# Patient Record
Sex: Male | Born: 1940 | Race: Black or African American | Hispanic: No | Marital: Single | Smoking: Former smoker
Health system: Southern US, Community
[De-identification: ages and names within clinical notes are randomized; demographics above are authoritative.]

## PROBLEM LIST (undated history)

## (undated) DIAGNOSIS — R531 Weakness: Secondary | ICD-10-CM

## (undated) DIAGNOSIS — K219 Gastro-esophageal reflux disease without esophagitis: Secondary | ICD-10-CM

## (undated) DIAGNOSIS — G959 Disease of spinal cord, unspecified: Secondary | ICD-10-CM

## (undated) DIAGNOSIS — D509 Iron deficiency anemia, unspecified: Secondary | ICD-10-CM

## (undated) DIAGNOSIS — I10 Essential (primary) hypertension: Secondary | ICD-10-CM

## (undated) DIAGNOSIS — Z8719 Personal history of other diseases of the digestive system: Secondary | ICD-10-CM

## (undated) DIAGNOSIS — Z8612 Personal history of poliomyelitis: Secondary | ICD-10-CM

## (undated) DIAGNOSIS — S82001A Unspecified fracture of right patella, initial encounter for closed fracture: Secondary | ICD-10-CM

## (undated) DIAGNOSIS — Z87440 Personal history of urinary (tract) infections: Secondary | ICD-10-CM

## (undated) DIAGNOSIS — E785 Hyperlipidemia, unspecified: Secondary | ICD-10-CM

## (undated) HISTORY — PX: OTHER SURGICAL HISTORY: SHX169

---

## 1982-06-06 HISTORY — PX: OTHER SURGICAL HISTORY: SHX169

## 1997-10-20 ENCOUNTER — Emergency Department (HOSPITAL_COMMUNITY): Admission: EM | Admit: 1997-10-20 | Discharge: 1997-10-20 | Payer: Self-pay | Admitting: Emergency Medicine

## 1997-12-23 ENCOUNTER — Other Ambulatory Visit: Admission: RE | Admit: 1997-12-23 | Discharge: 1997-12-23 | Payer: Self-pay | Admitting: Family Medicine

## 2003-03-07 ENCOUNTER — Encounter: Payer: Self-pay | Admitting: Emergency Medicine

## 2003-03-07 ENCOUNTER — Emergency Department (HOSPITAL_COMMUNITY): Admission: EM | Admit: 2003-03-07 | Discharge: 2003-03-07 | Payer: Self-pay | Admitting: Emergency Medicine

## 2003-03-13 ENCOUNTER — Ambulatory Visit (HOSPITAL_COMMUNITY): Admission: RE | Admit: 2003-03-13 | Discharge: 2003-03-13 | Payer: Self-pay | Admitting: Emergency Medicine

## 2003-03-13 ENCOUNTER — Encounter: Payer: Self-pay | Admitting: Emergency Medicine

## 2003-04-16 ENCOUNTER — Inpatient Hospital Stay (HOSPITAL_COMMUNITY): Admission: RE | Admit: 2003-04-16 | Discharge: 2003-04-21 | Payer: Self-pay | Admitting: Neurological Surgery

## 2003-04-16 HISTORY — PX: CERVICAL FUSION: SHX112

## 2003-04-21 ENCOUNTER — Inpatient Hospital Stay
Admission: RE | Admit: 2003-04-21 | Discharge: 2003-05-08 | Payer: Self-pay | Admitting: Physical Medicine & Rehabilitation

## 2003-04-22 ENCOUNTER — Ambulatory Visit (HOSPITAL_COMMUNITY)
Admission: RE | Admit: 2003-04-22 | Discharge: 2003-04-22 | Payer: Self-pay | Admitting: Physical Medicine & Rehabilitation

## 2003-06-10 ENCOUNTER — Encounter: Admission: RE | Admit: 2003-06-10 | Discharge: 2003-06-10 | Payer: Self-pay | Admitting: Neurological Surgery

## 2003-07-22 ENCOUNTER — Emergency Department (HOSPITAL_COMMUNITY): Admission: EM | Admit: 2003-07-22 | Discharge: 2003-07-22 | Payer: Self-pay | Admitting: Emergency Medicine

## 2003-11-26 ENCOUNTER — Emergency Department (HOSPITAL_COMMUNITY): Admission: EM | Admit: 2003-11-26 | Discharge: 2003-11-26 | Payer: Self-pay | Admitting: Emergency Medicine

## 2003-12-22 ENCOUNTER — Encounter: Admission: RE | Admit: 2003-12-22 | Discharge: 2003-12-22 | Payer: Self-pay | Admitting: Neurological Surgery

## 2004-02-12 ENCOUNTER — Ambulatory Visit: Payer: Self-pay | Admitting: *Deleted

## 2004-02-12 ENCOUNTER — Ambulatory Visit: Payer: Self-pay | Admitting: Family Medicine

## 2004-02-23 ENCOUNTER — Emergency Department (HOSPITAL_COMMUNITY): Admission: EM | Admit: 2004-02-23 | Discharge: 2004-02-23 | Payer: Self-pay | Admitting: Emergency Medicine

## 2004-03-29 ENCOUNTER — Ambulatory Visit: Payer: Self-pay | Admitting: Family Medicine

## 2004-05-13 ENCOUNTER — Ambulatory Visit: Payer: Self-pay | Admitting: Family Medicine

## 2004-06-09 ENCOUNTER — Ambulatory Visit: Payer: Self-pay | Admitting: Family Medicine

## 2004-08-11 ENCOUNTER — Ambulatory Visit: Payer: Self-pay | Admitting: Family Medicine

## 2004-12-14 ENCOUNTER — Ambulatory Visit: Payer: Self-pay | Admitting: Family Medicine

## 2005-03-29 ENCOUNTER — Emergency Department (HOSPITAL_COMMUNITY): Admission: EM | Admit: 2005-03-29 | Discharge: 2005-03-29 | Payer: Self-pay | Admitting: Emergency Medicine

## 2005-04-05 ENCOUNTER — Ambulatory Visit: Payer: Self-pay | Admitting: Family Medicine

## 2005-05-19 ENCOUNTER — Ambulatory Visit: Payer: Self-pay | Admitting: Family Medicine

## 2005-10-03 ENCOUNTER — Ambulatory Visit: Payer: Self-pay | Admitting: Family Medicine

## 2006-01-30 ENCOUNTER — Ambulatory Visit: Payer: Self-pay | Admitting: Family Medicine

## 2006-04-25 ENCOUNTER — Ambulatory Visit: Payer: Self-pay | Admitting: Family Medicine

## 2006-08-07 ENCOUNTER — Ambulatory Visit: Payer: Self-pay | Admitting: Family Medicine

## 2007-01-23 ENCOUNTER — Ambulatory Visit: Payer: Self-pay | Admitting: Family Medicine

## 2007-03-22 ENCOUNTER — Emergency Department (HOSPITAL_COMMUNITY): Admission: EM | Admit: 2007-03-22 | Discharge: 2007-03-23 | Payer: Self-pay | Admitting: Emergency Medicine

## 2007-03-26 ENCOUNTER — Emergency Department (HOSPITAL_COMMUNITY): Admission: EM | Admit: 2007-03-26 | Discharge: 2007-03-26 | Payer: Self-pay | Admitting: *Deleted

## 2007-04-25 ENCOUNTER — Ambulatory Visit: Payer: Self-pay | Admitting: Family Medicine

## 2007-05-31 ENCOUNTER — Emergency Department (HOSPITAL_COMMUNITY): Admission: EM | Admit: 2007-05-31 | Discharge: 2007-05-31 | Payer: Self-pay | Admitting: Emergency Medicine

## 2007-07-13 ENCOUNTER — Ambulatory Visit: Payer: Self-pay | Admitting: Family Medicine

## 2007-10-17 ENCOUNTER — Inpatient Hospital Stay (HOSPITAL_COMMUNITY): Admission: EM | Admit: 2007-10-17 | Discharge: 2007-10-23 | Payer: Self-pay | Admitting: Emergency Medicine

## 2007-10-22 ENCOUNTER — Ambulatory Visit: Payer: Self-pay | Admitting: Surgery

## 2007-10-22 ENCOUNTER — Encounter (INDEPENDENT_AMBULATORY_CARE_PROVIDER_SITE_OTHER): Payer: Self-pay | Admitting: Internal Medicine

## 2008-02-19 ENCOUNTER — Ambulatory Visit: Payer: Self-pay | Admitting: Family Medicine

## 2008-02-19 LAB — CONVERTED CEMR LAB
ALT: 8 units/L (ref 0–53)
Albumin: 3.6 g/dL (ref 3.5–5.2)
CO2: 24 meq/L (ref 19–32)
Chloride: 99 meq/L (ref 96–112)
Cholesterol: 228 mg/dL — ABNORMAL HIGH (ref 0–200)
Glucose, Bld: 100 mg/dL — ABNORMAL HIGH (ref 70–99)
LDL Cholesterol: 179 mg/dL — ABNORMAL HIGH (ref 0–99)
Microalb, Ur: 1.01 mg/dL (ref 0.00–1.89)
PSA: 1.02 ng/mL (ref 0.10–4.00)
Potassium: 4.7 meq/L (ref 3.5–5.3)
Sodium: 134 meq/L — ABNORMAL LOW (ref 135–145)
Total Bilirubin: 0.4 mg/dL (ref 0.3–1.2)
Total Protein: 6.9 g/dL (ref 6.0–8.3)
Triglycerides: 83 mg/dL (ref <150)
VLDL: 17 mg/dL (ref 0–40)

## 2009-09-20 ENCOUNTER — Emergency Department (HOSPITAL_COMMUNITY): Admission: EM | Admit: 2009-09-20 | Discharge: 2009-09-20 | Payer: Self-pay | Admitting: Emergency Medicine

## 2010-01-06 ENCOUNTER — Inpatient Hospital Stay (HOSPITAL_COMMUNITY): Admission: EM | Admit: 2010-01-06 | Discharge: 2010-01-13 | Payer: Self-pay | Admitting: Emergency Medicine

## 2010-01-12 ENCOUNTER — Ambulatory Visit: Payer: Self-pay | Admitting: Physical Medicine & Rehabilitation

## 2010-02-21 ENCOUNTER — Emergency Department (HOSPITAL_COMMUNITY): Admission: EM | Admit: 2010-02-21 | Discharge: 2010-02-22 | Payer: Self-pay | Admitting: Emergency Medicine

## 2010-08-19 LAB — URINE CULTURE: Colony Count: >=100000

## 2010-08-19 LAB — URINALYSIS, ROUTINE W REFLEX MICROSCOPIC
Nitrite: NEGATIVE
Protein, ur: 30 mg/dL — AB
Urobilinogen, UA: 2 mg/dL — ABNORMAL HIGH (ref 0.0–1.0)

## 2010-08-19 LAB — CBC
MCH: 26.7 pg (ref 26.0–34.0)
MCV: 80.5 fL (ref 78.0–100.0)
Platelets: 324 10*3/uL (ref 150–400)
RDW: 14.8 % (ref 11.5–15.5)
WBC: 10.2 10*3/uL (ref 4.0–10.5)

## 2010-08-19 LAB — GLUCOSE, CAPILLARY
Glucose-Capillary: 298 mg/dL — ABNORMAL HIGH (ref 70–99)
Glucose-Capillary: 345 mg/dL — ABNORMAL HIGH (ref 70–99)

## 2010-08-19 LAB — DIFFERENTIAL
Basophils Absolute: 0 10*3/uL (ref 0.0–0.1)
Eosinophils Absolute: 0.2 10*3/uL (ref 0.0–0.7)
Eosinophils Relative: 2 % (ref 0–5)
Neutrophils Relative %: 71 % (ref 43–77)

## 2010-08-19 LAB — POCT I-STAT, CHEM 8
HCT: 42 % (ref 39.0–52.0)
Hemoglobin: 14.3 g/dL (ref 13.0–17.0)
Potassium: 4.5 mEq/L (ref 3.5–5.1)
Sodium: 131 mEq/L — ABNORMAL LOW (ref 135–145)
TCO2: 30 mmol/L (ref 0–100)

## 2010-08-19 LAB — URINE MICROSCOPIC-ADD ON

## 2010-08-20 LAB — GLUCOSE, CAPILLARY
Glucose-Capillary: 145 mg/dL — ABNORMAL HIGH (ref 70–99)
Glucose-Capillary: 164 mg/dL — ABNORMAL HIGH (ref 70–99)
Glucose-Capillary: 166 mg/dL — ABNORMAL HIGH (ref 70–99)
Glucose-Capillary: 172 mg/dL — ABNORMAL HIGH (ref 70–99)
Glucose-Capillary: 182 mg/dL — ABNORMAL HIGH (ref 70–99)
Glucose-Capillary: 235 mg/dL — ABNORMAL HIGH (ref 70–99)
Glucose-Capillary: 236 mg/dL — ABNORMAL HIGH (ref 70–99)
Glucose-Capillary: 239 mg/dL — ABNORMAL HIGH (ref 70–99)
Glucose-Capillary: 301 mg/dL — ABNORMAL HIGH (ref 70–99)
Glucose-Capillary: 353 mg/dL — ABNORMAL HIGH (ref 70–99)

## 2010-08-20 LAB — CBC
HCT: 33.7 % — ABNORMAL LOW (ref 39.0–52.0)
HCT: 35.1 % — ABNORMAL LOW (ref 39.0–52.0)
HCT: 36.3 % — ABNORMAL LOW (ref 39.0–52.0)
HCT: 41 % (ref 39.0–52.0)
Hemoglobin: 10.7 g/dL — ABNORMAL LOW (ref 13.0–17.0)
Hemoglobin: 11.4 g/dL — ABNORMAL LOW (ref 13.0–17.0)
Hemoglobin: 13.6 g/dL (ref 13.0–17.0)
MCH: 25.2 pg — ABNORMAL LOW (ref 26.0–34.0)
MCH: 25.9 pg — ABNORMAL LOW (ref 26.0–34.0)
MCHC: 32.2 g/dL (ref 30.0–36.0)
MCHC: 32.5 g/dL (ref 30.0–36.0)
MCV: 79.5 fL (ref 78.0–100.0)
MCV: 79.6 fL (ref 78.0–100.0)
Platelets: 334 10*3/uL (ref 150–400)
Platelets: 336 10*3/uL (ref 150–400)
RBC: 4.21 MIL/uL — ABNORMAL LOW (ref 4.22–5.81)
RDW: 14.2 % (ref 11.5–15.5)
RDW: 14.3 % (ref 11.5–15.5)
RDW: 14.4 % (ref 11.5–15.5)
RDW: 14.5 % (ref 11.5–15.5)
WBC: 4.3 10*3/uL (ref 4.0–10.5)
WBC: 4.4 10*3/uL (ref 4.0–10.5)
WBC: 6.7 10*3/uL (ref 4.0–10.5)
WBC: 7.7 10*3/uL (ref 4.0–10.5)
WBC: 9.3 10*3/uL (ref 4.0–10.5)

## 2010-08-20 LAB — BASIC METABOLIC PANEL
BUN: 2 mg/dL — ABNORMAL LOW (ref 6–23)
BUN: 4 mg/dL — ABNORMAL LOW (ref 6–23)
BUN: 5 mg/dL — ABNORMAL LOW (ref 6–23)
BUN: 6 mg/dL (ref 6–23)
BUN: 6 mg/dL (ref 6–23)
CO2: 27 mEq/L (ref 19–32)
CO2: 28 mEq/L (ref 19–32)
CO2: 31 mEq/L (ref 19–32)
Calcium: 8.1 mg/dL — ABNORMAL LOW (ref 8.4–10.5)
Calcium: 8.1 mg/dL — ABNORMAL LOW (ref 8.4–10.5)
Calcium: 8.5 mg/dL (ref 8.4–10.5)
Calcium: 8.7 mg/dL (ref 8.4–10.5)
Chloride: 101 mEq/L (ref 96–112)
Chloride: 97 mEq/L (ref 96–112)
Creatinine, Ser: 0.41 mg/dL (ref 0.4–1.5)
Creatinine, Ser: 0.49 mg/dL (ref 0.4–1.5)
Creatinine, Ser: 0.51 mg/dL (ref 0.4–1.5)
GFR calc Af Amer: >60 mL/min (ref >60)
GFR calc Af Amer: >60 mL/min (ref >60)
GFR calc Af Amer: >60 mL/min (ref >60)
GFR calc non Af Amer: >60 mL/min (ref >60)
GFR calc non Af Amer: >60 mL/min (ref >60)
GFR calc non Af Amer: >60 mL/min (ref >60)
GFR calc non Af Amer: >60 mL/min (ref >60)
GFR calc non Af Amer: >60 mL/min (ref >60)
Glucose, Bld: 164 mg/dL — ABNORMAL HIGH (ref 70–99)
Glucose, Bld: 168 mg/dL — ABNORMAL HIGH (ref 70–99)
Glucose, Bld: 171 mg/dL — ABNORMAL HIGH (ref 70–99)
Glucose, Bld: 96 mg/dL (ref 70–99)
Potassium: 3.1 mEq/L — ABNORMAL LOW (ref 3.5–5.1)
Potassium: 3.7 mEq/L (ref 3.5–5.1)
Potassium: 4 mEq/L (ref 3.5–5.1)
Potassium: 4 mEq/L (ref 3.5–5.1)
Potassium: 4.1 mEq/L (ref 3.5–5.1)
Sodium: 136 mEq/L (ref 135–145)
Sodium: 137 mEq/L (ref 135–145)
Sodium: 138 mEq/L (ref 135–145)
Sodium: 139 mEq/L (ref 135–145)

## 2010-08-20 LAB — DIFFERENTIAL
Basophils Absolute: 0 10*3/uL (ref 0.0–0.1)
Basophils Relative: 0 % (ref 0–1)
Basophils Relative: 1 % (ref 0–1)
Eosinophils Absolute: 0.1 10*3/uL (ref 0.0–0.7)
Lymphocytes Relative: 36 % (ref 12–46)
Lymphs Abs: 1.6 10*3/uL (ref 0.7–4.0)
Monocytes Absolute: 0.7 10*3/uL (ref 0.1–1.0)
Monocytes Relative: 16 % — ABNORMAL HIGH (ref 3–12)
Neutro Abs: 1.8 10*3/uL (ref 1.7–7.7)
Neutrophils Relative %: 42 % — ABNORMAL LOW (ref 43–77)
Neutrophils Relative %: 89 % — ABNORMAL HIGH (ref 43–77)

## 2010-08-20 LAB — URINALYSIS, ROUTINE W REFLEX MICROSCOPIC
Glucose, UA: NEGATIVE mg/dL
Ketones, ur: 15 mg/dL — AB
pH: 6 (ref 5.0–8.0)

## 2010-08-20 LAB — MAGNESIUM
Magnesium: 1.4 mg/dL — ABNORMAL LOW (ref 1.5–2.5)
Magnesium: 2.1 mg/dL (ref 1.5–2.5)

## 2010-08-20 LAB — POCT I-STAT, CHEM 8
HCT: 44 % (ref 39.0–52.0)
Hemoglobin: 15 g/dL (ref 13.0–17.0)
Potassium: 3.7 mEq/L (ref 3.5–5.1)
Sodium: 136 mEq/L (ref 135–145)

## 2010-08-20 LAB — URINE MICROSCOPIC-ADD ON

## 2010-08-20 LAB — RETICULOCYTES
Retic Count, Absolute: 52.8 10*3/uL (ref 19.0–186.0)
Retic Ct Pct: 1.2 % (ref 0.4–3.1)

## 2010-08-20 LAB — IRON AND TIBC
Saturation Ratios: 10 % — ABNORMAL LOW (ref 20–55)
TIBC: 230 ug/dL (ref 215–435)

## 2010-08-20 LAB — CULTURE, BLOOD (ROUTINE X 2)
Culture: NO GROWTH
Culture: NO GROWTH

## 2010-08-20 LAB — HEMOGLOBIN A1C
Hgb A1c MFr Bld: 8.3 % — ABNORMAL HIGH (ref <5.7)
Mean Plasma Glucose: 192 mg/dL — ABNORMAL HIGH (ref <117)

## 2010-08-20 LAB — VITAMIN B12: Vitamin B-12: >2000 pg/mL — ABNORMAL HIGH (ref 211–911)

## 2010-08-20 LAB — FOLATE: Folate: >20 ng/mL

## 2010-08-20 LAB — URINE CULTURE: Culture  Setup Time: 201108031932

## 2010-10-19 NOTE — H&P (Signed)
NAMEJAKEEL, STARLIPER NO.:  [id number]   MEDICAL RECORD NO.:  [id number]          PATIENT TYPE:  EMS   LOCATION:  ED                           FACILITY:  [facility]   PHYSICIAN:  Altha Harm, MDDATE OF BIRTH:  04/17/1941   DATE OF ADMISSION:  10/17/2007  DATE OF DISCHARGE:                              HISTORY & PHYSICAL   CHIEF COMPLAINT:  Inability to walk after a fall.   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  The patient  states that while ambulating with his cane this morning in the kitchen  he stumbled over a heater and fell injuring his right lower extremity.  X-rays in the emergency room demonstrated a distal patella fracture with  minimal displacement.  The patient at this point is unable to ambulate.  He resides at home with his brother who is unable to provide any  assistance for him.  The patient states that prior to this fall he was  ambulating minimally with a cane.  However, he was able to negotiate  household distances.  The patient states that he has adaptive equiptment  secondary to his post polio syndrome and weakness and his gait  disturbance.  The patient is unable to ambulate at this point secondary  to the fracture thus is unable to return home to a safe environment and  we are asked to admit the patient.  The patient denies any dizziness,  loss of consciousness, seizure activity, palpitations at the time of the  fall.  However, he does report that in the last 4-5 days he has had  intermittent dizziness and some blurring of vision.  He states that  since being here in the emergency room he has had several instances of  dizziness.  He is unable to elaborate any further on this symptom.  The  patient states that he is had no recent modifications in his  medications.   PAST MEDICAL HISTORY:  1. Diabetes type 2.  2. Hypertension.  3. Polio with post  polio syndrome.  4. Cervical myelopathy.  5. Status post spinal fusion.  6. Status post fracture of the right femur with open reduction      internal fixation.  7. History of fracture of patella in the past.   FAMILY HISTORY:  Though positive is irrelevant to the patient at this  age.   SOCIAL HISTORY:  The patient resides with his brother.  He denies any  tobacco, alcohol or drug use.  He ambulates with a cane.   CURRENT MEDICATIONS:  1. Metformin.  2. Avandia.  3. Hydrochlorothiazide.  4. Benazepril.  5. Lantus.   The patient is unable to provide doses for any of his medications at  this time.  We will attempt to get a list of medications from his [facility] on [address].   ALLERGIES:  CODEINE.   PRIMARY MEDICAL PHYSICIAN:  [facility].   REVIEW OF SYSTEMS:  Fourteen systems reviewed and all systems are  negative except as noted in the HPI.  STUDIES IN THE EMERGENCY ROOM:  Show the following:  White blood cell  count is 6.3, hemoglobin 12, hematocrit 36, platelet count 299.  Sodium  134, potassium 3.8, chloride 95, bicarb 31, BUN 11, creatinine 0.55,  blood glucose 251.  A CT of the head shows no acute intracranial  abnormalities.  The x-ray of the right knee shows minimally displaced  fracture of the distal pole of the patella.   PHYSICAL EXAMINATION:  In general, the patient is an obese, nontoxic and  well-appearing gentleman.  He is sitting in bed in no acute distress.  Please note, the right lower extremity shows a knee immobilizer applied  at this time.  His vital signs are as follows: Blood pressure 125/60,  heart rate 88, respiratory rate 18, temperature 97.9 and O2 sats 97% on  room air.   HEENT:  He is normocephalic, atraumatic.  Pupils equally round and  reactive to light and accommodation.  Extraocular movements are intact.  There is no icterus.  No conjunctival pallor noted.  Tympanic membranes  translucent bilaterally with good landmarks.   Nasal mucosa shows no  polyps.  Oropharynx is moist.  No exudate, erythema or lesions are  noted.  The patient has good dental hygiene.  NECK:  Neck is obese, trachea is midline.  No masses, no thyromegaly. I  am unable to appreciate any JVD or any carotid bruit.  RESPIRATORY:  The patient has normal respiratory effort, equal excursion  bilaterally.  He has got no wheezing or rhonchi noted on auscultation.  There is no increased vocal fremitus noted and he has resonance to  percussion.  CARDIOVASCULAR:  He has got a normal S1-S2; no murmurs, rubs or gallops  noted.  PMI is nondisplaced.  No heaves or thrills on palpation.  ABDOMEN:  The abdomen is grossly obese, soft, nontender, nondistended.  No masses, no hepatosplenomegaly and the patient has normoactive bowel  sounds.  NEUROLOGICAL:  The patient has muscular wasting of bilateral lower  extremities.  He is unable to participate in strength examination of the  bilateral lower extremities.  However, he does have 4+/5 strength in  bilateral upper extremities and DTRs 2+ in bilateral upper extremities.  Please note, I was unable to elicit DTRs in the left lower extremities  and was not performed in the right lower extremity secondary to the  patella fracture.  He appears to have no focal neurological deficits.  Cranial nerves II-XII are grossly intact.  Sensation is intact to light  touch and proprioception.  MUSCULOSKELETAL:  As noted.  The right lower extremity has been  immobilized by orthopedic surgery.  This was not examined. Left lower  extremity has muscular wasting.  There is no warmth, swelling or  erythema around the joints.  There is no warmth, burning or erythema  around the joints of the bilateral upper extremities and the patient has  functional range of motion for his age.  There is no spinal tenderness  on palpation noted.  PSYCHIATRIC: The patient is alert and oriented x3.  He has got good  insight and cognition, good  recent and remote recall.   ASSESSMENT AND PLAN:  This patient who presents with:  1. Fracture of the right patella status post fall.  2. Intermittent dizziness.  3. History of hypertension.  4. History of diabetes type 2.  5. 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  6. Gait disturbance.   PLAN:  The patient will be admitted and occupational therapy  and  physical therapy will see the patient for evaluation and treatment.  Orthopedic surgery has already seen the patient and they have given  orders to PT and OT without any restrictions in weight bearing.  The  patient will be continued on his usual medications for his hypertension  and his diabetes.  That is of course after we have received the  medication list either from his primary physician or the pharmacy.  We  will monitor his blood sugars while here and I will check orthostatics  on the patient given his complaints of dizziness.  Social work will be  consulted for the patient as my assessment is that the patient will need  skilled nursing facility placement for rehab until he is able to  ambulate  and function independently in his home environment. The  patient will receive DVT and GI prophylaxis with Lovenox and Protonix.      Altha Harm, MD  Electronically Signed     MAM/MEDQ  D:  10/17/2007  T:  10/17/2007  Job:  [id number]   cc:   Melvern Banker  Fax: [phone]

## 2010-10-19 NOTE — Discharge Summary (Signed)
Timothy Gross, Timothy Gross              ACCOUNT NO.:  [id number]   MEDICAL RECORD NO.:  [id number]          PATIENT TYPE:  INP   LOCATION:  1304                         FACILITY:  [facility]   PHYSICIAN:  Hillery Aldo, M.D.   DATE OF BIRTH:  17-Aug-1940   DATE OF ADMISSION:  10/17/2007  DATE OF DISCHARGE:  10/23/2007                               DISCHARGE SUMMARY   PRIMARY CARE PHYSICIAN:  Dr. Audria Nine at [facility].   DISCHARGE DIAGNOSES:  1. Klebsiella pneumoniae abscess/cellulitis.  2. 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  3. Right patellar fracture.  4. Diabetes mellitus.  5. Hypertension.  6. Hyponatremia.  7. History of cervical myelopathy.   DISCHARGE MEDICATIONS:  1. Benazepril 40 mg daily.  2. Cipro 500 mg q.12 h. through Oct 30, 2007.  3. Mucinex 600 mg b.i.d.  4. Hydrochlorothiazide 25 mg daily.  5. Lantus insulin 15 units subcutaneously at bedtime.  6. NovoLog insulin, 4 units q.a.c.  7. Sliding scale insulin, moderate scale, q.a.c. and at bedtime.  8. Glucophage 1000 mg q.a.m., 500 mg at bedtime.  9. Lopressor 50 mg daily.  10.Avandia 8 mg daily.  11.Senokot 1 tablet at bedtime p.r.n.  12.Tylenol 650 mg q.4 h. p.r.n.  13.Oxycodone 5-10 mg q.4 h. p.r.n. pain.   CONSULTATIONS:  Dr. Rennis Chris of orthopedic surgery.   BRIEF ADMISSION HISTORY OF PRESENT ILLNESS:  The patient is a 70-year-  old male with a known history of gait disturbance secondary to post  polio syndrome.  He had trouble with falling in the past, and on the  date of admission, fell, injuring his right lower extremity.  He was  brought to the emergency department, and he was found to have a distal  patellar fracture with minimal displacement.  He was brought into the  hospital secondary to inability to ambulate in the setting of living  independently.  For full details, please see the dictated report done by  Dr. Ashley Royalty.   PROCEDURES AND DIAGNOSTIC STUDIES:  1. Four views  of the knee on Oct 17, 2007 showed nondisplaced fracture      through the distal pole of the patella.  2. CT scan of the head on Oct 17, 2007 showed no significant      abnormality.  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   DISCHARGE LABORATORY VALUES:  Sodium was 129, potassium 4.4, chloride  92, bicarbonate 30, BUN 13, creatinine 0.54, glucose 159.  White blood  cell count was 5.2, hemoglobin 12.3, hematocrit 37.3, platelets 320.   HOSPITAL COURSE:  1. 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 Rennis Chris with orthopedic surgery.  There was no surgical      indication.  Recommendations were to continue physical and      occupational therapy as well as weightbearing as tolerated with use      of a knee immobilizer.  He is to follow  up with Dr. Rennis Chris in      approximately 1 month's time.  Because of ongoing complaints of      pain, a Doppler study was done on Oct 22, 2007 which did not reveal      any evidence of DVT.  2. Klebsiella pneumonia abscess/cellulitis:  The patient complained of      an area of irritation on his buttocks.  Examination of this area      revealed a boil.  It was lanced with a needle, and cultures were      sent.  Cultures subsequently grew out Klebsiella pneumoniae.  The      patient was initially put on vancomycin, and when culture data      revealed Klebsiella pneumoniae, his antibiotics were switched to      Cipro.  He is to complete a 10-day course of therapy with Cipro.  3. Diabetes mellitus:  The patient had previously claimed to be well      controlled.  A hemoglobin A1c was checked and found to be 7.9%,      indicating suboptimal control in the outpatient setting.  The      patient was put on a combination of Lantus, sliding scale, and meal      coverage insulin in addition to his usual oral hypoglycemics.  It       is possible that his recent infection and stress from his fall may      be contributory to his need for higher insulin dosages, and this      will need to be reassessed frequently as he recuperates.  4. Hypertension:  The patient's blood pressure has remained well      controlled on the regimen as outlined above.  5. Hyponatremia.  The patient has developed some mild hyponatremia.      We recommend a 1200 mL fluid restriction, and  if he continues to      have problems with hyponatremia, consideration for discontinuing      hydrochlorothiazide can be considered by his outpatient physician.   DISPOSITION:  The patient is medically stable to be discharged to a  skilled nursing facility.  Plans are to send him to [facility] today.  He should maintain a heart-healthy, diabetic diet.  He  should be on a 1200 mL fluid restriction.  He should follow up with Dr.  Rennis Chris in 1 month.      Hillery Aldo, M.D.  Electronically Signed     CR/MEDQ  D:  10/23/2007  T:  10/23/2007  Job:  [id number]   cc:   Maurice March, M.D.  Fax: [phone]

## 2010-10-22 NOTE — Discharge Summary (Signed)
NAMEPALMER, FAHRNER NO.:  [id number]   MEDICAL RECORD NO.:  [id number]                   PATIENT TYPE:  INP   LOCATION:  3027                                 FACILITY:  [facility]   PHYSICIAN:  Tia Alert, MD                  DATE OF BIRTH:  [birth year]   DATE OF ADMISSION:  04/16/2003  DATE OF DISCHARGE:  04/21/2003                                 DISCHARGE SUMMARY   ADMISSION DIAGNOSIS:  Cervical spondylitic myelopathy.   PROCEDURE:  Decompressive cervical laminectomy and instrumented  fusion C3  through C6.   HISTORY OF PRESENT ILLNESS:  Mr. Printup is a 70 year old black male with a  history of  diabetes mellitus and polio, who presented to the  neurosurgery  clinic with complaints of neck  pain and bilateral upper extremity pain. He  was found to be myelopathic on examination. He had an MRI which showed  severe cervical spinal stenosis from C3 through C6. I recommended  cervical  decompressive laminectomy and instrumented fusion. He understood the  risks,  benefits and alternatives and wished to proceed.   HOSPITAL COURSE:  The patient was admitted on April 16, 2003, and was  taken to the operating room, where he underwent a decompressive cervical  laminectomy C3 through C6 with lateral mass fixation and fusion. The patient  tolerated the procedure well and was taken to the recovery room and then to  the floor in stable condition. For details of the operative procedure please  see the dictated operative note.   The patient's  hospital course was routine. There were no complications. He  was fairly slow to mobilize because of his history of polio and his morbid  obesity. He participated in physical therapy and occupational therapy and  made significant strides to the point that he was ambulating with moderate  assistance. He had some minor drainage from his surgical incision and 2  small staples were placed at the inferior end of the  incision and the  drainage was  stopped.   He was transferred to rehabilitation on April 21, 2003, in stable  condition. He had plans to follow up with Dr. Yetta Barre in 2 weeks after  discharge  from rehabilitation.   FINAL DIAGNOSIS:  Cervical spondylitic myelopathy, status post  cervical  decompressive laminectomy and instrumented fusion.                                                Tia Alert, MD    DSJ/MEDQ  D:  05/23/2003  T:  05/24/2003  Job:  [id number]

## 2010-10-22 NOTE — Op Note (Signed)
NAMEDANEL, REQUENA NO.:  [id number]   MEDICAL RECORD NO.:  [id number]                   PATIENT TYPE:  OIB   LOCATION:  3021                                 FACILITY:  [facility]   PHYSICIAN:  Tia Alert, MD                  DATE OF BIRTH:  10-28-1940   DATE OF PROCEDURE:  04/16/2003  DATE OF DISCHARGE:                                 OPERATIVE REPORT   PREOPERATIVE DIAGNOSES:  Cervical spondylosis with cervical spinal stenosis  C3-4, C4-5, C5-6, with cervical spondylitic myelopathy.   POSTOPERATIVE DIAGNOSES:  Cervical spondylosis with cervical spinal stenosis  C3-4, C4-5, C5-6, with cervical spondylitic myelopathy.   PROCEDURE:  1. Decompressive cervical laminectomy C3 to C5, with partial laminectomy at     C6 for spinal cord decompression.  2. Posterior cervical fusion C3 to C6, utilizing locally-harvested     morcellized autologous bone graft.  3. Segmental lateral mass screw fixation C3 to C6, utilizing the vertex     lateral mass screws.   SURGEON:  [place]. Yetta Barre, M.D.   ASSISTANT:  Donalee Citrin, M.D.   ANESTHESIA:  General endotracheal.   COMPLICATIONS:  None apparent.   INDICATIONS FOR PROCEDURE:  The patient is a 70 year old black male with a  history of polio, who fell about two years ago, striking his head, and had a  brief paralysis associated with this.  He had neck pain with left arm pain,  and had progressive weakness and gait difficulties.  He was found to be  myelopathic on examination.  He had an MRI which showed severe cervical  spinal stenosis from spondylosis at C3-4, C4-5 and C5-6.  He had signal  change in the spinal cord at C4-5.  I recommended a posterior cervical  decompression and instrument fusion.  He understood the risks, benefits and  alternatives, and wished to proceed.   DESCRIPTION OF PROCEDURE:  The patient was taken to the operating room and  after the induction of adequate general endotracheal  anesthesia, his head  was affixed in a three-point [place] head rest.  He was rolled into the  prone position on chest rolls.  His head was locked into a neutral position.  His posterior cervical region was shaved, and then prepped with DuraPrep,  and then draped in the usual sterile fashion.  Then 10 mL of local  anesthesia was injected, and then a dorsal midline incision was made and  carried down to the cervical fascia.  The fascia was opened and the  paraspinous musculature was taken down in a subperiosteal fashion to expose  the C3, C4, C5 and C6 laminae out over the lateral masses.  An  intraoperative x-ray confirmed my level.  I  then localized the entry zones  for the lateral mass screws 1.0 mm medial to the mid-point of the lateral  mass.  I decorticated this, and then used  a hand drill to drill in an upward  and outward direction into the safe zone of the lateral mass.  I then tapped  each hole and placed the 3.5 mm x 12.0 mm lateral mass screws into the C3  lateral masses, and 3.5 mm x 14.0 mm screws into the lateral masses of C4,  C5 and C6 bilaterally.  I then used a Leksell rongeur to remove the spinous  processes of C3, C4 and C5, and then performed a complete laminectomy with  the Kerrison punch from C3 to C5, and took off the top of C6 also.  Once the  spinal cord decompression was complete, we shaved two rods into a slightly  lordotic curvature, and put these into the multiaxial screwheads of the  vertex lateral mass screws, and locked these into position with the locking  caps in the anti-torque device.  We then irrigated with copious amounts of  bacitracin-containing saline solution.  We decorticated the lateral masses  bilaterally, and placed autograft out over these to perform an arthrodesis.  We then placed a medium Hemovac drain through a separate stab incision, and  then closed the muscle, and then the fascia with interrupted #1 Vicryl.  We  closed the subcutaneous  tissue with #2-0 and #3-0 Vicryl.  We closed the  skin with Benzoin and Steri-Strips.  The drapes were removed.  A sterile  dressing was applied. The patient was awakened from general anesthesia and  transported to the recovery room in a stable condition.  At the end of the  procedure, all sponge, needle and instrument counts were correct.                                               Tia Alert, MD    DSJ/MEDQ  D:  04/16/2003  T:  04/17/2003  Job:  [id number]
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
## 2010-11-22 ENCOUNTER — Emergency Department (HOSPITAL_COMMUNITY)
Admission: EM | Admit: 2010-11-22 | Discharge: 2010-11-22 | Disposition: A | Discharge status: Home / Self Care | Payer: Medicare Other | Attending: Emergency Medicine | Admitting: Emergency Medicine

## 2010-11-22 ENCOUNTER — Encounter (HOSPITAL_COMMUNITY): Payer: Self-pay | Admitting: Radiology

## 2010-11-22 ENCOUNTER — Emergency Department (HOSPITAL_COMMUNITY): Payer: Medicare Other

## 2010-11-22 DIAGNOSIS — E785 Hyperlipidemia, unspecified: Secondary | ICD-10-CM | POA: Insufficient documentation

## 2010-11-22 DIAGNOSIS — R630 Anorexia: Secondary | ICD-10-CM | POA: Insufficient documentation

## 2010-11-22 DIAGNOSIS — E669 Obesity, unspecified: Secondary | ICD-10-CM | POA: Insufficient documentation

## 2010-11-22 DIAGNOSIS — E119 Type 2 diabetes mellitus without complications: Secondary | ICD-10-CM | POA: Insufficient documentation

## 2010-11-22 DIAGNOSIS — K5900 Constipation, unspecified: Secondary | ICD-10-CM | POA: Insufficient documentation

## 2010-11-22 DIAGNOSIS — R110 Nausea: Secondary | ICD-10-CM | POA: Insufficient documentation

## 2010-11-22 DIAGNOSIS — Z8612 Personal history of poliomyelitis: Secondary | ICD-10-CM | POA: Insufficient documentation

## 2010-11-22 DIAGNOSIS — Z79899 Other long term (current) drug therapy: Secondary | ICD-10-CM | POA: Insufficient documentation

## 2010-11-22 DIAGNOSIS — I10 Essential (primary) hypertension: Secondary | ICD-10-CM | POA: Insufficient documentation

## 2010-11-22 DIAGNOSIS — K8020 Calculus of gallbladder without cholecystitis without obstruction: Secondary | ICD-10-CM | POA: Insufficient documentation

## 2010-11-22 DIAGNOSIS — R1032 Left lower quadrant pain: Secondary | ICD-10-CM | POA: Insufficient documentation

## 2010-11-22 HISTORY — DX: Essential (primary) hypertension: I10

## 2010-11-22 LAB — COMPREHENSIVE METABOLIC PANEL
ALT: 12 U/L (ref 0–53)
AST: 14 U/L (ref 0–37)
Alkaline Phosphatase: 48 U/L (ref 39–117)
CO2: 30 mEq/L (ref 19–32)
Chloride: 95 mEq/L — ABNORMAL LOW (ref 96–112)
Creatinine, Ser: <0.47 mg/dL — ABNORMAL LOW (ref 0.50–1.35)
Potassium: 3.6 mEq/L (ref 3.5–5.1)
Sodium: 134 mEq/L — ABNORMAL LOW (ref 135–145)
Total Bilirubin: 0.3 mg/dL (ref 0.3–1.2)

## 2010-11-22 LAB — DIFFERENTIAL
Basophils Absolute: 0 10*3/uL (ref 0.0–0.1)
Eosinophils Absolute: 0.1 10*3/uL (ref 0.0–0.7)
Lymphs Abs: 1.4 10*3/uL (ref 0.7–4.0)
Neutrophils Relative %: 59 % (ref 43–77)

## 2010-11-22 LAB — CBC
MCV: 81.5 fL (ref 78.0–100.0)
Platelets: 280 10*3/uL (ref 150–400)
RBC: 4.76 MIL/uL (ref 4.22–5.81)
WBC: 4.7 10*3/uL (ref 4.0–10.5)

## 2010-11-22 LAB — URINALYSIS, ROUTINE W REFLEX MICROSCOPIC
Glucose, UA: NEGATIVE mg/dL
Leukocytes, UA: NEGATIVE
Protein, ur: NEGATIVE mg/dL
pH: 7.5 (ref 5.0–8.0)

## 2010-11-22 MED ORDER — IOHEXOL 300 MG/ML  SOLN
100.0000 mL | Freq: Once | INTRAMUSCULAR | Status: AC | PRN
  Administered 2010-11-22: 100 mL via INTRAVENOUS

## 2011-03-02 LAB — COMPREHENSIVE METABOLIC PANEL
ALT: 14
Albumin: 2.7 — ABNORMAL LOW
Alkaline Phosphatase: 39
BUN: 11
Chloride: 93 — ABNORMAL LOW
Glucose, Bld: 136 — ABNORMAL HIGH
Potassium: 4.6
Sodium: 132 — ABNORMAL LOW
Total Bilirubin: 2.1 — ABNORMAL HIGH
Total Protein: 6.3

## 2011-03-02 LAB — BASIC METABOLIC PANEL
CO2: 30
Calcium: 8.7
Calcium: 8.7
Creatinine, Ser: 0.54
GFR calc non Af Amer: >60
GFR calc non Af Amer: >60
Glucose, Bld: 159 — ABNORMAL HIGH
Potassium: 4
Sodium: 129 — ABNORMAL LOW
Sodium: 131 — ABNORMAL LOW

## 2011-03-02 LAB — CBC
HCT: 36.6 — ABNORMAL LOW
Hemoglobin: 12 — ABNORMAL LOW
Hemoglobin: 12.3 — ABNORMAL LOW
MCHC: 32.9
Platelets: 294
Platelets: 320
RDW: 14.6
WBC: 5.5

## 2011-03-02 LAB — WOUND CULTURE

## 2011-03-11 LAB — URINALYSIS, ROUTINE W REFLEX MICROSCOPIC
Nitrite: NEGATIVE
Protein, ur: NEGATIVE
Specific Gravity, Urine: 1.014
Urobilinogen, UA: 1

## 2011-03-16 LAB — URINALYSIS, ROUTINE W REFLEX MICROSCOPIC
Glucose, UA: 250 — AB
Ketones, ur: 15 — AB
Protein, ur: NEGATIVE
Urobilinogen, UA: 1

## 2011-07-27 ENCOUNTER — Inpatient Hospital Stay (HOSPITAL_COMMUNITY)
Admission: EM | Admit: 2011-07-27 | Discharge: 2011-07-30 | DRG: 392 | Disposition: A | Discharge status: Home Health | Payer: Medicare Other | Attending: Internal Medicine | Admitting: Internal Medicine

## 2011-07-27 ENCOUNTER — Encounter (HOSPITAL_COMMUNITY): Payer: Self-pay

## 2011-07-27 ENCOUNTER — Emergency Department (HOSPITAL_COMMUNITY): Payer: Medicare Other

## 2011-07-27 DIAGNOSIS — R109 Unspecified abdominal pain: Secondary | ICD-10-CM | POA: Diagnosis present

## 2011-07-27 DIAGNOSIS — R197 Diarrhea, unspecified: Secondary | ICD-10-CM | POA: Diagnosis present

## 2011-07-27 DIAGNOSIS — R1013 Epigastric pain: Secondary | ICD-10-CM | POA: Diagnosis present

## 2011-07-27 DIAGNOSIS — E860 Dehydration: Secondary | ICD-10-CM | POA: Diagnosis present

## 2011-07-27 DIAGNOSIS — I10 Essential (primary) hypertension: Secondary | ICD-10-CM | POA: Diagnosis present

## 2011-07-27 DIAGNOSIS — E1165 Type 2 diabetes mellitus with hyperglycemia: Secondary | ICD-10-CM | POA: Diagnosis present

## 2011-07-27 DIAGNOSIS — R1011 Right upper quadrant pain: Principal | ICD-10-CM | POA: Diagnosis present

## 2011-07-27 DIAGNOSIS — Z87440 Personal history of urinary (tract) infections: Secondary | ICD-10-CM

## 2011-07-27 DIAGNOSIS — Z8612 Personal history of poliomyelitis: Secondary | ICD-10-CM | POA: Diagnosis present

## 2011-07-27 DIAGNOSIS — E785 Hyperlipidemia, unspecified: Secondary | ICD-10-CM | POA: Diagnosis present

## 2011-07-27 DIAGNOSIS — E871 Hypo-osmolality and hyponatremia: Secondary | ICD-10-CM | POA: Diagnosis present

## 2011-07-27 DIAGNOSIS — IMO0001 Reserved for inherently not codable concepts without codable children: Secondary | ICD-10-CM | POA: Diagnosis present

## 2011-07-27 DIAGNOSIS — R7989 Other specified abnormal findings of blood chemistry: Secondary | ICD-10-CM | POA: Diagnosis present

## 2011-07-27 DIAGNOSIS — R269 Unspecified abnormalities of gait and mobility: Secondary | ICD-10-CM | POA: Diagnosis present

## 2011-07-27 DIAGNOSIS — R5381 Other malaise: Secondary | ICD-10-CM | POA: Diagnosis present

## 2011-07-27 DIAGNOSIS — R5383 Other fatigue: Secondary | ICD-10-CM | POA: Diagnosis present

## 2011-07-27 DIAGNOSIS — D509 Iron deficiency anemia, unspecified: Secondary | ICD-10-CM | POA: Diagnosis present

## 2011-07-27 DIAGNOSIS — K219 Gastro-esophageal reflux disease without esophagitis: Secondary | ICD-10-CM | POA: Diagnosis present

## 2011-07-27 DIAGNOSIS — K8020 Calculus of gallbladder without cholecystitis without obstruction: Secondary | ICD-10-CM | POA: Diagnosis present

## 2011-07-27 DIAGNOSIS — R531 Weakness: Secondary | ICD-10-CM | POA: Diagnosis present

## 2011-07-27 DIAGNOSIS — IMO0002 Reserved for concepts with insufficient information to code with codable children: Secondary | ICD-10-CM | POA: Diagnosis present

## 2011-07-27 HISTORY — DX: Unspecified fracture of right patella, initial encounter for closed fracture: S82.001A

## 2011-07-27 HISTORY — DX: Personal history of poliomyelitis: Z86.12

## 2011-07-27 HISTORY — DX: Hyperlipidemia, unspecified: E78.5

## 2011-07-27 HISTORY — DX: Personal history of other diseases of the digestive system: Z87.19

## 2011-07-27 HISTORY — DX: Iron deficiency anemia, unspecified: D50.9

## 2011-07-27 HISTORY — DX: Personal history of urinary (tract) infections: Z87.440

## 2011-07-27 HISTORY — DX: Disease of spinal cord, unspecified: G95.9

## 2011-07-27 HISTORY — DX: Gastro-esophageal reflux disease without esophagitis: K21.9

## 2011-07-27 HISTORY — DX: Weakness: R53.1

## 2011-07-27 LAB — BASIC METABOLIC PANEL
CO2: 28 mEq/L (ref 19–32)
Calcium: 9.2 mg/dL (ref 8.4–10.5)
Creatinine, Ser: 0.47 mg/dL — ABNORMAL LOW (ref 0.50–1.35)
GFR calc non Af Amer: >90 mL/min (ref >90)
Sodium: 131 mEq/L — ABNORMAL LOW (ref 135–145)

## 2011-07-27 LAB — CBC
MCH: 28.2 pg (ref 26.0–34.0)
MCV: 82.1 fL (ref 78.0–100.0)
Platelets: 282 10*3/uL (ref 150–400)
RBC: 4.76 MIL/uL (ref 4.22–5.81)
RDW: 13.4 % (ref 11.5–15.5)
WBC: 5.6 10*3/uL (ref 4.0–10.5)

## 2011-07-27 LAB — DIFFERENTIAL
Basophils Relative: 0 % (ref 0–1)
Lymphs Abs: 1.6 10*3/uL (ref 0.7–4.0)
Monocytes Absolute: 0.5 10*3/uL (ref 0.1–1.0)
Monocytes Relative: 8 % (ref 3–12)
Neutrophils Relative %: 62 % (ref 43–77)

## 2011-07-27 MED ORDER — GI COCKTAIL ~~LOC~~
30.0000 mL | Freq: Once | ORAL | Status: AC
  Administered 2011-07-27: 30 mL via ORAL
  Filled 2011-07-27: qty 30

## 2011-07-27 MED ORDER — SODIUM CHLORIDE 0.9 % IV SOLN
INTRAVENOUS | Status: DC
  Administered 2011-07-27 (×2): via INTRAVENOUS

## 2011-07-27 MED ORDER — FENTANYL CITRATE 0.05 MG/ML IJ SOLN
50.0000 ug | Freq: Once | INTRAMUSCULAR | Status: AC
  Administered 2011-07-27: 50 ug via INTRAVENOUS
  Filled 2011-07-27: qty 2

## 2011-07-27 MED ORDER — ONDANSETRON HCL 4 MG/2ML IJ SOLN
4.0000 mg | Freq: Once | INTRAMUSCULAR | Status: AC
  Administered 2011-07-27: 4 mg via INTRAVENOUS
  Filled 2011-07-27: qty 2

## 2011-07-27 MED ORDER — SODIUM CHLORIDE 0.9 % IV BOLUS (SEPSIS)
700.0000 mL | Freq: Once | INTRAVENOUS | Status: AC
  Administered 2011-07-27: 700 mL via INTRAVENOUS

## 2011-07-27 MED ORDER — FAMOTIDINE IN NACL 20-0.9 MG/50ML-% IV SOLN
20.0000 mg | Freq: Once | INTRAVENOUS | Status: AC
  Administered 2011-07-27: 20 mg via INTRAVENOUS
  Filled 2011-07-27: qty 50

## 2011-07-27 NOTE — ED Provider Notes (Cosign Needed)
History     CSN: [id number]  Arrival date & time 07/27/11  1453   First MD Initiated Contact with Patient 07/27/11 1459      Chief Complaint  Patient presents with  . Diarrhea    (Consider location/radiation/quality/duration/timing/severity/associated sxs/prior treatment) HPI  Patient relates he has been having problems with diarrhea off and on 2 years. He relates he had a colonoscopy done about 2 years ago that was okay. He states is getting worse the past 2-3 weeks and states he is not having nausea or vomiting. He states he is having diarrhea and sometimes he doesn't go for couple days. He states he's had 2 or 3 infections in the  past year he seems to mean his GI tract. He denies been on antibiotics in the past year. He states his mouth feels dry, his hands and feet feel cold, he feels weak and dizzy.  PCP Andi Devon  Past Medical History  Diagnosis Date  . Diabetes mellitus   . Hypertension    polio when 71 years old, was in the hospital for years  History reviewed. No pertinent past surgical history.  History reviewed. No pertinent family history.  History  Substance Use Topics  . Smoking status: Former Smoker quit 30 years ago  . Smokeless tobacco: Not on file  . Alcohol Use: No quit 15 years ago    lives at home with his brother Uses a walker    Review of Systems  All other systems reviewed and are negative.    Allergies  Review of patient's allergies indicates no known allergies.  Home Medications   Current Outpatient Rx  Name Route Sig Dispense Refill  . BENAZEPRIL HCL 40 MG PO TABS Oral Take 40 mg by mouth 2 (two) times daily.    . TEARS RENEWED OP SOLN Both Eyes Place 1 drop into both eyes 3 (three) times daily as needed. For dry eyes    . HYDROCHLOROTHIAZIDE 25 MG PO TABS Oral Take 25 mg by mouth daily.    . INSULIN GLARGINE 100 UNIT/ML [place] SOLN Subcutaneous Inject 25 Units into the skin at bedtime.    Marland Kitchen METFORMIN HCL 500 MG PO TABS Oral Take  1,000 mg by mouth 2 (two) times daily with a meal.    . METOPROLOL TARTRATE 100 MG PO TABS Oral Take 50 mg by mouth 2 (two) times daily.    . ADULT MULTIVITAMIN W/MINERALS CH Oral Take 1 tablet by mouth daily.    Marland Kitchen OMEPRAZOLE 40 MG PO CPDR Oral Take 40 mg by mouth 2 (two) times daily.    Marland Kitchen SIMVASTATIN 40 MG PO TABS Oral Take 40 mg by mouth every evening.      BP 170/81  Pulse 92  Temp(Src) 98.7 F (37.1 C) (Oral)  Resp 20  Ht 5\' 10"  (1.778 m)  Wt 290 lb (131.543 kg)  BMI 41.61 kg/m2  SpO2 97%  Vital signs normal for hypertension   Physical Exam  Nursing note and vitals reviewed. Constitutional: He is oriented to person, place, and time. He appears well-developed and well-nourished.  Non-toxic appearance. He does not appear ill. No distress.       Pleasantly elderly male who is very talkative  HENT:  Head: Normocephalic and atraumatic.  Right Ear: External ear normal.  Left Ear: External ear normal.  Nose: Nose normal. No mucosal edema or rhinorrhea.  Mouth/Throat: Mucous membranes are normal. No dental abscesses or uvula swelling.       Mucous membranes  dry  Eyes: Conjunctivae and EOM are normal. Pupils are equal, round, and reactive to light.  Neck: Normal range of motion and full passive range of motion without pain. Neck supple.  Cardiovascular: Normal rate, regular rhythm and normal heart sounds.  Exam reveals no gallop and no friction rub.   No murmur heard. Pulmonary/Chest: Effort normal and breath sounds normal. No respiratory distress. He has no wheezes. He has no rhonchi. He has no rales. He exhibits no tenderness and no crepitus.  Abdominal: Soft. Normal appearance and bowel sounds are normal. He exhibits no distension. There is no tenderness. There is no rebound and no guarding.  Musculoskeletal: Normal range of motion. He exhibits no edema and no tenderness.       Moves all extremities well.   Neurological: He is alert and oriented to person, place, and time. He has  normal strength. No cranial nerve deficit.  Skin: Skin is warm, dry and intact. No rash noted. No erythema. No pallor.  Psychiatric: He has a normal mood and affect. His speech is normal and behavior is normal. His mood appears not anxious.    ED Course  Procedures (including critical care time)  Pt getting agitated, states now he is having abdominal pain for 3 weeks, upset because he isn't being allowed to eat or drink. States he has a lower chest pain that radiates into his epigastrum and to his RUQ that has been constant for 3 weeks. Pt concerned about his CBG and was given his result. Pt asking to drink and told 3 times he couldn't b/o his abdominal pain.  00:15 radiologist called back CT states c/w mild pancreatitis  00:43 signed out to Dr Brooke Dare to get lipase and lft's.    Medications  sodium chloride 0.9 % bolus 700 mL (700 mL Intravenous Given 07/27/11 1558)  famotidine (PEPCID) IVPB 20 mg (20 mg Intravenous Given 07/27/11 1924)  ondansetron (ZOFRAN) injection 4 mg (4 mg Intravenous Given 07/27/11 1921)  fentaNYL (SUBLIMAZE) injection 50 mcg (50 mcg Intravenous Given 07/27/11 1923)  gi cocktail (30 mL Oral Given 07/27/11 1921)  fentaNYL (SUBLIMAZE) injection 50 mcg (50 mcg Intravenous Given 07/27/11 2238)  iohexol (OMNIPAQUE) 300 MG/ML solution 100 mL (100 mL Intravenous Contrast Given 07/28/11 0005)  fentaNYL (SUBLIMAZE) injection 50 mcg (50 mcg Intravenous Given 07/28/11 0100)      Results for orders placed during the hospital encounter of 07/27/11  BASIC METABOLIC PANEL      Component Value Range   Sodium 131 (*) 135 - 145 (mEq/L)   Potassium 3.6  3.5 - 5.1 (mEq/L)   Chloride 92 (*) 96 - 112 (mEq/L)   CO2 28  19 - 32 (mEq/L)   Glucose, Bld 149 (*) 70 - 99 (mg/dL)   BUN 5 (*) 6 - 23 (mg/dL)   Creatinine, Ser 1.61 (*) 0.50 - 1.35 (mg/dL)   Calcium 9.2  8.4 - 09.6 (mg/dL)   GFR calc non Af Amer >90  >90 (mL/min)   GFR calc Af Amer >90  >90 (mL/min)  CBC      Component Value  Range   WBC 5.6  4.0 - 10.5 (K/uL)   RBC 4.76  4.22 - 5.81 (MIL/uL)   Hemoglobin 13.4  13.0 - 17.0 (g/dL)   HCT 04.5  40.9 - 81.1 (%)   MCV 82.1  78.0 - 100.0 (fL)   MCH 28.2  26.0 - 34.0 (pg)   MCHC 34.3  30.0 - 36.0 (g/dL)   RDW 91.4  78.2 -  15.5 (%)   Platelets 282  150 - 400 (K/uL)  DIFFERENTIAL      Component Value Range   Neutrophils Relative 62  43 - 77 (%)   Neutro Abs 3.5  1.7 - 7.7 (K/uL)   Lymphocytes Relative 29  12 - 46 (%)   Lymphs Abs 1.6  0.7 - 4.0 (K/uL)   Monocytes Relative 8  3 - 12 (%)   Monocytes Absolute 0.5  0.1 - 1.0 (K/uL)   Eosinophils Relative 1  0 - 5 (%)   Eosinophils Absolute 0.1  0.0 - 0.7 (K/uL)   Basophils Relative 0  0 - 1 (%)   Basophils Absolute 0.0  0.0 - 0.1 (K/uL)   Laboratory interpretation all normal except for hyponatremia and low chloride consistent with dehydration   Ct Abdomen Pelvis W Contrast  07/28/2011  *RADIOLOGY REPORT*  Clinical Data: Abdominal pain and diarrhea.  History of sickle cell disease.  CT ABDOMEN AND PELVIS WITH CONTRAST  Technique:  Multidetector CT imaging of the abdomen and pelvis was performed following the standard protocol during bolus administration of intravenous contrast.  Contrast: OMNIPAQUE IOHEXOL 300 MG/ML IV SOLN  Comparison: CT of the abdomen and pelvis performed 11/22/2010  Findings: Minimal bibasilar atelectasis is noted.  The liver and spleen are unremarkable in appearance.  A small stone is noted at the base of the gallbladder; the gallbladder is otherwise unremarkable in appearance.  The adrenal glands are unremarkable.  Note is made of mild soft tissue inflammation about the second segment of the duodenum and adjacent pancreatic head; this could conceivably reflect very mild pancreatitis.  There is no evidence for pseudocyst formation or devascularization at this time.  Nonspecific perinephric stranding is noted bilaterally.  The kidneys are otherwise unremarkable in appearance.  There is no evidence  of hydronephrosis.  No renal or ureteral stones are seen.  No free fluid is identified.  An apparent contrast-filled diverticulum is noted along the proximal ileum at the left mid abdomen; the remainder of the small bowel is unremarkable in appearance.  The stomach is within normal limits.  No acute vascular abnormalities are seen. Scattered calcification is noted along the abdominal aorta and its branches.  This is particularly prominent along the proximal superior mesenteric artery and at the origins of both renal arteries, especially on the right.  The appendix is normal in caliber and contains air, without evidence for appendicitis.  Scattered diverticulosis is noted along the distal descending and sigmoid colon, without evidence of diverticulitis.  The colon is otherwise unremarkable in appearance.  The bladder is mildly distended and grossly unremarkable in appearance.  The prostate is enlarged, measuring 5.4 cm in transverse dimension; the prostate impresses on the base of the bladder, as on the prior study.  No inguinal lymphadenopathy is seen.  There is diffuse atrophy of the paraspinal musculature.  No acute osseous abnormalities are identified.  Vacuum phenomenon is noted at T10-T11 and at L4-L5, and there is disc space narrowing at L5-S1  IMPRESSION:  1.  Mild soft tissue inflammation noted about the second segment of the duodenum and adjacent pancreatic head; this could conceivably reflect very mild pancreatitis.  Suggest correlation with lab values.  No evidence for pseudocyst formation or devascularization at this time. 2.  Cholelithiasis; gallbladder otherwise unremarkable in appearance. 3.  Scattered calcification along the abdominal aorta and its branches; this is particularly prominent along the proximal superior mesenteric artery and at the origins of both renal arteries, especially on  the right. 4.  Scattered diverticulosis along the distal descending sigmoid colon, without evidence of  diverticulitis. 5.  Enlarged prostate, measuring 5.4 cm in transverse dimension, impressing on the base of the bladder. 6.  Diffuse atrophy of the paraspinal musculature. 7.  Mild degenerative change along the lower thoracic and lumbar spine.  Original Report Authenticated By: Tonia Ghent, M.D.     Initial Impression Chronic diarrhea Abdominal pain Pancreatitis  Plan per Dr Brooke Dare once labs resulted  Devoria Albe, MD, FACEP    MDM          Ward Givens, MD 07/28/11 1019

## 2011-07-27 NOTE — ED Notes (Signed)
ZOX:[id number]<AV> Expected date:07/27/11<BR> Expected time: 2:36 PM<BR> Means of arrival:Ambulance<BR> Comments:<BR> Diarrhea x14month

## 2011-07-27 NOTE — ED Notes (Signed)
Pt brought by ems from  home with c/o diarrhea for almost a year, but worsening for the last 2 weeks with body aches, and abd pain

## 2011-07-27 NOTE — ED Notes (Signed)
Patient belongings consist of black sweat pants and shirt. White socks, bedroom slippers, wallet, hat, underwear and cell phone. Patient has watch on wrist and earring in ear.

## 2011-07-28 ENCOUNTER — Encounter (HOSPITAL_COMMUNITY)
Admission: EM | Disposition: A | Discharge status: Home Health | Payer: Self-pay | Source: Home / Self Care | Attending: Internal Medicine

## 2011-07-28 ENCOUNTER — Encounter (HOSPITAL_COMMUNITY): Payer: Self-pay | Admitting: Internal Medicine

## 2011-07-28 ENCOUNTER — Emergency Department (HOSPITAL_COMMUNITY): Payer: Medicare Other

## 2011-07-28 ENCOUNTER — Other Ambulatory Visit: Payer: Self-pay

## 2011-07-28 DIAGNOSIS — K219 Gastro-esophageal reflux disease without esophagitis: Secondary | ICD-10-CM | POA: Diagnosis present

## 2011-07-28 DIAGNOSIS — E871 Hypo-osmolality and hyponatremia: Secondary | ICD-10-CM | POA: Diagnosis present

## 2011-07-28 DIAGNOSIS — D509 Iron deficiency anemia, unspecified: Secondary | ICD-10-CM | POA: Diagnosis present

## 2011-07-28 DIAGNOSIS — R7989 Other specified abnormal findings of blood chemistry: Secondary | ICD-10-CM | POA: Diagnosis present

## 2011-07-28 DIAGNOSIS — E785 Hyperlipidemia, unspecified: Secondary | ICD-10-CM | POA: Diagnosis present

## 2011-07-28 DIAGNOSIS — R531 Weakness: Secondary | ICD-10-CM | POA: Diagnosis present

## 2011-07-28 DIAGNOSIS — E860 Dehydration: Secondary | ICD-10-CM | POA: Diagnosis present

## 2011-07-28 DIAGNOSIS — Z8612 Personal history of poliomyelitis: Secondary | ICD-10-CM | POA: Diagnosis present

## 2011-07-28 DIAGNOSIS — E1165 Type 2 diabetes mellitus with hyperglycemia: Secondary | ICD-10-CM | POA: Diagnosis present

## 2011-07-28 DIAGNOSIS — R109 Unspecified abdominal pain: Secondary | ICD-10-CM | POA: Diagnosis present

## 2011-07-28 DIAGNOSIS — I10 Essential (primary) hypertension: Secondary | ICD-10-CM | POA: Diagnosis present

## 2011-07-28 DIAGNOSIS — Z87440 Personal history of urinary (tract) infections: Secondary | ICD-10-CM | POA: Insufficient documentation

## 2011-07-28 HISTORY — PX: ESOPHAGOGASTRODUODENOSCOPY: SHX5428

## 2011-07-28 LAB — GLUCOSE, CAPILLARY: Glucose-Capillary: 202 mg/dL — ABNORMAL HIGH (ref 70–99)

## 2011-07-28 LAB — HEPATIC FUNCTION PANEL
Alkaline Phosphatase: 144 U/L — ABNORMAL HIGH (ref 39–117)
Indirect Bilirubin: 0.3 mg/dL (ref 0.3–0.9)
Total Protein: 7.2 g/dL (ref 6.0–8.3)

## 2011-07-28 LAB — HEMOGLOBIN A1C: Hgb A1c MFr Bld: 9 % — ABNORMAL HIGH (ref <5.7)

## 2011-07-28 LAB — LIPASE, BLOOD: Lipase: 19 U/L (ref 11–59)

## 2011-07-28 LAB — TROPONIN I: Troponin I: <0.3 ng/mL (ref <0.30)

## 2011-07-28 SURGERY — EGD (ESOPHAGOGASTRODUODENOSCOPY)
Anesthesia: Moderate Sedation

## 2011-07-28 MED ORDER — SODIUM CHLORIDE 0.9 % IV SOLN
INTRAVENOUS | Status: AC
  Administered 2011-07-28: 12:00:00 via INTRAVENOUS

## 2011-07-28 MED ORDER — SIMVASTATIN 40 MG PO TABS
40.0000 mg | ORAL_TABLET | Freq: Every evening | ORAL | Status: DC
  Administered 2011-07-28 – 2011-07-29 (×2): 40 mg via ORAL
  Filled 2011-07-28 (×3): qty 1

## 2011-07-28 MED ORDER — SODIUM CHLORIDE 0.9 % IV SOLN: INTRAVENOUS | Status: DC

## 2011-07-28 MED ORDER — METOPROLOL TARTRATE 50 MG PO TABS
50.0000 mg | ORAL_TABLET | Freq: Two times a day (BID) | ORAL | Status: DC
  Administered 2011-07-28 – 2011-07-30 (×4): 50 mg via ORAL
  Filled 2011-07-28 (×5): qty 1

## 2011-07-28 MED ORDER — BUTAMBEN-TETRACAINE-BENZOCAINE 2-2-14 % EX AERO
INHALATION_SPRAY | CUTANEOUS | Status: DC | PRN
  Administered 2011-07-28: 2 via TOPICAL

## 2011-07-28 MED ORDER — SODIUM CHLORIDE 0.9 % IV SOLN: Freq: Once | INTRAVENOUS | Status: DC

## 2011-07-28 MED ORDER — ONDANSETRON HCL 4 MG/2ML IJ SOLN: 4.0000 mg | Freq: Four times a day (QID) | INTRAMUSCULAR | Status: DC | PRN

## 2011-07-28 MED ORDER — INSULIN ASPART 100 UNIT/ML ~~LOC~~ SOLN
0.0000 [IU] | Freq: Three times a day (TID) | SUBCUTANEOUS | Status: DC
  Filled 2011-07-28: qty 3

## 2011-07-28 MED ORDER — ACETAMINOPHEN 325 MG PO TABS: 650.0000 mg | ORAL_TABLET | Freq: Four times a day (QID) | ORAL | Status: DC | PRN

## 2011-07-28 MED ORDER — FENTANYL CITRATE 0.05 MG/ML IJ SOLN
50.0000 ug | Freq: Once | INTRAMUSCULAR | Status: AC
  Administered 2011-07-28: 50 ug via INTRAVENOUS
  Filled 2011-07-28: qty 2

## 2011-07-28 MED ORDER — TEARS RENEWED OP SOLN: 1.0000 [drp] | Freq: Three times a day (TID) | OPHTHALMIC | Status: DC | PRN

## 2011-07-28 MED ORDER — HYDROCHLOROTHIAZIDE 25 MG PO TABS
25.0000 mg | ORAL_TABLET | Freq: Every day | ORAL | Status: DC
  Administered 2011-07-28 – 2011-07-30 (×3): 25 mg via ORAL
  Filled 2011-07-28 (×3): qty 1

## 2011-07-28 MED ORDER — HYDROMORPHONE HCL PF 1 MG/ML IJ SOLN: 1.0000 mg | Freq: Once | INTRAMUSCULAR | Status: DC

## 2011-07-28 MED ORDER — ENOXAPARIN SODIUM 40 MG/0.4ML ~~LOC~~ SOLN
40.0000 mg | SUBCUTANEOUS | Status: DC
  Filled 2011-07-28 (×2): qty 0.4

## 2011-07-28 MED ORDER — BENAZEPRIL HCL 40 MG PO TABS
40.0000 mg | ORAL_TABLET | Freq: Two times a day (BID) | ORAL | Status: DC
  Administered 2011-07-28 – 2011-07-30 (×4): 40 mg via ORAL
  Filled 2011-07-28 (×7): qty 1

## 2011-07-28 MED ORDER — ADULT MULTIVITAMIN W/MINERALS CH
1.0000 | ORAL_TABLET | Freq: Every day | ORAL | Status: DC
  Administered 2011-07-28 – 2011-07-30 (×3): 1 via ORAL
  Filled 2011-07-28 (×3): qty 1

## 2011-07-28 MED ORDER — WHITE PETROLATUM GEL
Status: AC
  Administered 2011-07-28: 18:00:00
  Filled 2011-07-28: qty 5

## 2011-07-28 MED ORDER — ACETAMINOPHEN 650 MG RE SUPP: 650.0000 mg | Freq: Four times a day (QID) | RECTAL | Status: DC | PRN

## 2011-07-28 MED ORDER — FENTANYL NICU IV SYRINGE 50 MCG/ML
INJECTION | INTRAMUSCULAR | Status: DC | PRN
  Administered 2011-07-28 (×2): 25 ug via INTRAVENOUS

## 2011-07-28 MED ORDER — ONDANSETRON HCL 4 MG PO TABS: 4.0000 mg | ORAL_TABLET | Freq: Four times a day (QID) | ORAL | Status: DC | PRN

## 2011-07-28 MED ORDER — ALUM & MAG HYDROXIDE-SIMETH 200-200-20 MG/5ML PO SUSP: 30.0000 mL | Freq: Four times a day (QID) | ORAL | Status: DC | PRN

## 2011-07-28 MED ORDER — POLYVINYL ALCOHOL 1.4 % OP SOLN
1.0000 [drp] | Freq: Three times a day (TID) | OPHTHALMIC | Status: DC | PRN
  Filled 2011-07-28: qty 15

## 2011-07-28 MED ORDER — PANTOPRAZOLE SODIUM 40 MG PO TBEC
80.0000 mg | DELAYED_RELEASE_TABLET | Freq: Two times a day (BID) | ORAL | Status: DC
  Administered 2011-07-29 – 2011-07-30 (×3): 80 mg via ORAL
  Filled 2011-07-28 (×4): qty 2

## 2011-07-28 MED ORDER — HYDROMORPHONE HCL PF 1 MG/ML IJ SOLN
1.0000 mg | INTRAMUSCULAR | Status: DC | PRN
  Administered 2011-07-28: 1 mg via INTRAVENOUS
  Filled 2011-07-28: qty 1

## 2011-07-28 MED ORDER — IOHEXOL 300 MG/ML  SOLN
100.0000 mL | Freq: Once | INTRAMUSCULAR | Status: AC | PRN
  Administered 2011-07-28: 100 mL via INTRAVENOUS

## 2011-07-28 MED ORDER — INSULIN GLARGINE 100 UNIT/ML ~~LOC~~ SOLN
25.0000 [IU] | Freq: Every day | SUBCUTANEOUS | Status: DC
  Administered 2011-07-28 – 2011-07-29 (×2): 25 [IU] via SUBCUTANEOUS
  Filled 2011-07-28: qty 3

## 2011-07-28 MED ORDER — ONDANSETRON HCL 4 MG/2ML IJ SOLN
4.0000 mg | Freq: Three times a day (TID) | INTRAMUSCULAR | Status: DC | PRN
  Administered 2011-07-28: 4 mg via INTRAVENOUS
  Filled 2011-07-28: qty 2

## 2011-07-28 MED ORDER — MIDAZOLAM HCL 10 MG/2ML IJ SOLN
INTRAMUSCULAR | Status: DC | PRN
  Administered 2011-07-28 (×2): 2 mg via INTRAVENOUS

## 2011-07-28 MED ORDER — HYDROCODONE-ACETAMINOPHEN 5-325 MG PO TABS
1.0000 | ORAL_TABLET | Freq: Four times a day (QID) | ORAL | Status: DC | PRN
Start: 2011-07-28 — End: 2011-07-30
  Administered 2011-07-28 – 2011-07-30 (×6): 1 via ORAL
  Filled 2011-07-28 (×6): qty 1

## 2011-07-28 MED ORDER — MORPHINE SULFATE 2 MG/ML IJ SOLN
1.0000 mg | INTRAMUSCULAR | Status: DC | PRN
  Filled 2011-07-28: qty 1

## 2011-07-28 NOTE — Op Note (Signed)
[facility] [address] [place], [place]  [address]  ENDOSCOPY PROCEDURE REPORT  PATIENT:  Timothy Gross, Timothy Gross  MR#:  [id number] BIRTHDATE:  September 29, 1940, 70 yrs. old  GENDER:  male  ENDOSCOPIST:  Barbette Hair. Arlyce Dice, MD Referred by:  PROCEDURE DATE:  07/28/2011 PROCEDURE:  EGD, diagnostic 43235 ASA CLASS:  Class II INDICATIONS:  abdominal pain, abnormal imaging CT shows thickening of the duodenum  MEDICATIONS:   These medications were titrated to patient response per physician's verbal order, Fentanyl 50 mcg IV, Versed 4 mg IV TOPICAL ANESTHETIC:  Cetacaine Spray  DESCRIPTION OF PROCEDURE:   After the risks and benefits of the procedure were explained, informed consent was obtained.  The Pentax Gastroscope [id number] endoscope was introduced through the mouth and advanced to the third portion of the duodenum.  The instrument was slowly withdrawn as the mucosa was fully examined. <<PROCEDUREIMAGES>>  The upper, middle, and distal third of the esophagus were carefully inspected and no abnormalities were noted. The z-line was well seen at the GEJ. The endoscope was pushed into the fundus which was normal including a retroflexed view. The antrum,gastric body, first and second part of the duodenum were unremarkable (see image1, image2, image3, image4, and image5).    Retroflexed views revealed no abnormalities.    The scope was then withdrawn from the patient and the procedure completed.  COMPLICATIONS:  None  ENDOSCOPIC IMPRESSION: 1) Normal EGD RECOMMENDATIONS:Serial amylase and lipase Hold NSAIDS  ______________________________ Barbette Hair. Arlyce Dice, MD  CC:  n. eSIGNED:   Barbette Hair. Malike Foglio at 07/28/2011 02:59 PM  Cleora Fleet, [phone]

## 2011-07-28 NOTE — ED Notes (Signed)
No coverage given for Glucose 137 due to pt having given snack before CBG was taken. Sandwich and gingerale.

## 2011-07-28 NOTE — ED Notes (Signed)
Transporter called for transport to floor. 

## 2011-07-28 NOTE — ED Notes (Signed)
Pt taken to ENDO 

## 2011-07-28 NOTE — Progress Notes (Signed)
No abnormalities were seen at endoscopy. Pain could be due to acute pancreatitis or perhaps secondary to NSAID use  Recommendations #1 followup amylase and lipase #2 avoid NSAIDs #3 continue protonic

## 2011-07-28 NOTE — Consult Note (Signed)
Patient was seen and examined. X-rays reviewed.  Impression-acute abdominal pain superimposed on chronic intermittent pain. I suspect the patient may have active peptic ulcer disease in view of his nsaid use and CT findings. A posterior penetrating ulcer is a  Possibility.  Recommendations #1 Protonix #2 upper endoscopy

## 2011-07-28 NOTE — ED Notes (Signed)
Pt. Was given a bedside commode

## 2011-07-28 NOTE — ED Provider Notes (Addendum)
7:27 AM Care from Dr. Brooke Dare.  This is an approximately 2 weeks if epigastric and upper quadrant pain.  He does have cholelithiasis noted on a CT scan.  He has mild elevation in his LFTs.  On CT scan there is a small amount of stranding surrounding the pancreatic head as well as the second portion of his duodenum.  His lipase is normal.  His nerves and duodenitis.  Official ultrasound of his abdomen is being obtained at this time however brief treatment demonstrates stone in the gallbladder neck and a common bile duct of 7 mm.  Given the patient's symptoms and his abnormal CT scan as well as his cholelithiasis I think it is appropriate for an observation admission for hydration and continue monitoring of his symptoms.  He likely will require outpatient followup with general surgery.   Date: 07/28/2011  Rate: 83  Rhythm: normal sinus rhythm  QRS Axis: normal  Intervals: normal  ST/T Wave abnormalities: normal  Conduction Disutrbances: LAFB  Narrative Interpretation:   Old EKG Reviewed: No significant changes noted   [facility] to admit. Requests ECG  Lyanne Co, MD 07/28/11 0730  Lyanne Co, MD 07/28/11 [phone]

## 2011-07-28 NOTE — Consult Note (Signed)
[facility] Consultation  Referring Provider: [facility] Primary Care Physician:  Alva Garnet., MD, MD Primary Gastroenterologist:   ? Dr. Elnoria Howard Reason for Consultation:  Abdominal pain  HPI: Timothy Gross is a 71 y.o. male with a 2-3 year history of intermittent upper abdominal pain exacerbated by meals. The pain has gotten progressively worse lately, hence reason for ED evaluation. Patient hasn't consumed ETOH in 12 years. He takes daily Prilosec for heartburn. He has a remote history of "stress ulcers". Patient takes [facility] powders on a regular basis for arthritis. He has chronic loose stools, no black stools.     Past Medical History  Diagnosis Date  . Diabetes mellitus   . Hypertension   . History of UTI   . Iron deficiency anemia   . Hyperlipidemia   . GERD (gastroesophageal reflux disease)   . History of post-polio syndrome   . H/O acute alcoholic hepatitis   . Cervical myelopathy     Hx of  . Right patella fracture   . Right sided weakness     Past Surgical History  Procedure Date  . Cervical fusion 04/16/2003  . Orif right femur fracture   . Radioactive iodine treatment 1984    Prior to Admission medications   Medication Sig Start Date End Date Taking? Authorizing Provider  benazepril (LOTENSIN) 40 MG tablet Take 40 mg by mouth 2 (two) times daily.   Yes Historical Provider, MD  dextran 70-hypromellose (TEARS RENEWED) ophthalmic solution Place 1 drop into both eyes 3 (three) times daily as needed. For dry eyes   Yes Historical Provider, MD  hydrochlorothiazide (HYDRODIURIL) 25 MG tablet Take 25 mg by mouth daily.   Yes Historical Provider, MD  insulin glargine (LANTUS) 100 UNIT/ML injection Inject 25 Units into the skin at bedtime.   Yes Historical Provider, MD  metFORMIN (GLUCOPHAGE) 500 MG tablet Take 1,000 mg by mouth 2 (two) times daily with a meal.   Yes Historical Provider, MD  metoprolol (LOPRESSOR) 100 MG tablet Take 50 mg by mouth 2  (two) times daily.   Yes Historical Provider, MD  Multiple Vitamin (MULITIVITAMIN WITH MINERALS) TABS Take 1 tablet by mouth daily.   Yes Historical Provider, MD  omeprazole (PRILOSEC) 40 MG capsule Take 40 mg by mouth 2 (two) times daily.   Yes Historical Provider, MD  simvastatin (ZOCOR) 40 MG tablet Take 40 mg by mouth every evening.   Yes Historical Provider, MD    Current Facility-Administered Medications  Medication Dose Route Frequency Provider Last Rate Last Dose  . 0.9 %  sodium chloride infusion   Intravenous Continuous Cristal Ford, MD      . famotidine (PEPCID) IVPB 20 mg  20 mg Intravenous Once Ward Givens, MD   20 mg at 07/27/11 1924  . fentaNYL (SUBLIMAZE) injection 50 mcg  50 mcg Intravenous Once Ward Givens, MD   50 mcg at 07/27/11 1923  . fentaNYL (SUBLIMAZE) injection 50 mcg  50 mcg Intravenous Once Ward Givens, MD   50 mcg at 07/27/11 2238  . fentaNYL (SUBLIMAZE) injection 50 mcg  50 mcg Intravenous Once Ward Givens, MD   50 mcg at 07/28/11 0100  . gi cocktail  30 mL Oral Once Ward Givens, MD   30 mL at 07/27/11 1921  . HYDROmorphone (DILAUDID) injection 1 mg  1 mg Intravenous Once Lyanne Co, MD      . HYDROmorphone (DILAUDID) injection 1 mg  1 mg Intravenous Q4H PRN  Lyanne Co, MD   1 mg at 07/28/11 [phone]  . iohexol (OMNIPAQUE) 300 MG/ML solution 100 mL  100 mL Intravenous Once PRN Medication Radiologist, MD   100 mL at 07/28/11 0005  . ondansetron (ZOFRAN) injection 4 mg  4 mg Intravenous Once Ward Givens, MD   4 mg at 07/27/11 1921  . ondansetron (ZOFRAN) injection 4 mg  4 mg Intravenous Q8H PRN Lyanne Co, MD   4 mg at 07/28/11 9604  . sodium chloride 0.9 % bolus 700 mL  700 mL Intravenous Once Ward Givens, MD   700 mL at 07/27/11 1558  . white petrolatum (VASELINE) gel           . DISCONTD: 0.9 %  sodium chloride infusion   Intravenous Continuous Ward Givens, MD 125 mL/hr at 07/27/11 1921    . DISCONTD: 0.9 %  sodium chloride infusion   Intravenous Once  Lyanne Co, MD      . DISCONTD: 0.9 %  sodium chloride infusion   Intravenous STAT Lyanne Co, MD       Current Outpatient Prescriptions  Medication Sig Dispense Refill  . benazepril (LOTENSIN) 40 MG tablet Take 40 mg by mouth 2 (two) times daily.      Marland Kitchen dextran 70-hypromellose (TEARS RENEWED) ophthalmic solution Place 1 drop into both eyes 3 (three) times daily as needed. For dry eyes      . hydrochlorothiazide (HYDRODIURIL) 25 MG tablet Take 25 mg by mouth daily.      . insulin glargine (LANTUS) 100 UNIT/ML injection Inject 25 Units into the skin at bedtime.      . metFORMIN (GLUCOPHAGE) 500 MG tablet Take 1,000 mg by mouth 2 (two) times daily with a meal.      . metoprolol (LOPRESSOR) 100 MG tablet Take 50 mg by mouth 2 (two) times daily.      . Multiple Vitamin (MULITIVITAMIN WITH MINERALS) TABS Take 1 tablet by mouth daily.      Marland Kitchen omeprazole (PRILOSEC) 40 MG capsule Take 40 mg by mouth 2 (two) times daily.      . simvastatin (ZOCOR) 40 MG tablet Take 40 mg by mouth every evening.        Allergies as of 07/27/2011  . (No Known Allergies)    Family History  Problem Relation Age of Onset  . Diabetes Mother     History   Social History  . Marital Status: Single    Spouse Name: N/A    Number of Children: N/A  . Years of Education: N/A   Occupational History  . Not on file.   Social History Main Topics  . Smoking status: Former Smoker    Quit date: 07/27/1973  . Smokeless tobacco: Never Used  . Alcohol Use: No     Quit around 2001  . Drug Use: Not on file  . Sexually Active: No    Review of Systems: Positive for occasional lower extremity swelling. Positive for gas. All other systems reviewed and negative except where noted in HPI.   PHYSICAL EXAM: Vital signs in last 24 hours: Temp:  [98 F (36.7 C)-98.7 F (37.1 C)] 98 F (36.7 C) (02/21 0727) Pulse Rate:  [74-92] 88  (02/21 0727) Resp:  [14-20] 16  (02/21 0032) BP: (147-170)/(80-92) 147/92 mmHg  (02/21 0727) SpO2:  [91 %-97 %] 96 % (02/21 0727) Weight:  [290 lb (131.543 kg)] 290 lb (131.543 kg) (02/20 1513)   General:   Pleasant,  well developed black male in NAD Head:  Normocephalic and atraumatic. Eyes:   No icterus.   Conjunctiva pink. Ears:  Normal auditory acuity. Neck:  Supple; no masses felt Lungs:  Respirations even and unlabored. Lungs clear to auscultation bilaterally.   No wheezes, crackles, or rhonchi.  Heart:  Regular rate and rhythm Abdomen:  Soft, nondistended, nontender. Normal bowel sounds. No appreciable masses or hepatomegaly.  Rectal:  Not performed.  Msk:  Symmetrical without gross deformities.  Extremities:  Without edema. Neurologic:  Alert and  oriented  grossly normal neurologically. Skin:  Intact without significant lesions or rashes. Cervical Nodes:  No significant cervical adenopathy. Psych:  Alert and cooperative. Normal affect.  LAB RESULTS:  Basename 07/27/11 1530  WBC 5.6  HGB 13.4  HCT 39.1  PLT 282   BMET  Basename 07/27/11 1530  NA 131*  K 3.6  CL 92*  CO2 28  GLUCOSE 149*  BUN 5*  CREATININE 0.47*  CALCIUM 9.2   LFT  Basename 07/27/11 1530  PROT 7.2  ALBUMIN 3.4*  AST 46*  ALT 77*  ALKPHOS 144*  BILITOT 0.4  BILIDIR 0.1  IBILI 0.3   STUDIES: US Abdomen Complete  07/28/2011  *RADIOLOGY REPORT*  Clinical Data:  Diabetes.  Hypertensive.  Abdominal pain.  COMPLETE ABDOMINAL ULTRASOUND  Comparison:  07/27/2011 CT.  Findings:  Gallbladder:  1.5 cm gallstone gallbladder neck level.  Gallbladder wall measures up to 2.7 mm.  No pericholecystic fluid.  The patient was not tender over this region during scanning.  Common bile duct:  7.3 mm.  Liver:  Dense with slightly lobulated contour raising possibility of cirrhosis.  IVC:  Appears normal.  Pancreas:  Not visualized secondary to bowel gas.  Spleen:  7.0 cm without focal mass.  Right Kidney:  10.7 cm. No hydronephrosis or renal mass.  Left Kidney:  10.9 cm. No hydronephrosis or  renal mass.  Abdominal aorta:  Not well visualized secondary to bowel gas.  IMPRESSION: 1.5 cm gallstone gallbladder neck level without definitive findings of cholecystitis.  Question cirrhosis.  Limited evaluation of the pancreas and abdominal aorta secondary to bowel gas.  Original Report Authenticated By: Fuller Canada, M.D.   Ct Abdomen Pelvis W Contrast  07/28/2011  *RADIOLOGY REPORT*  Clinical Data: Abdominal pain and diarrhea.  History of sickle cell disease.  CT ABDOMEN AND PELVIS WITH CONTRAST  Technique:  Multidetector CT imaging of the abdomen and pelvis was performed following the standard protocol during bolus administration of intravenous contrast.  Contrast: OMNIPAQUE IOHEXOL 300 MG/ML IV SOLN  Comparison: CT of the abdomen and pelvis performed 11/22/2010  Findings: Minimal bibasilar atelectasis is noted.  The liver and spleen are unremarkable in appearance.  A small stone is noted at the base of the gallbladder; the gallbladder is otherwise unremarkable in appearance.  The adrenal glands are unremarkable.  Note is made of mild soft tissue inflammation about the second segment of the duodenum and adjacent pancreatic head; this could conceivably reflect very mild pancreatitis.  There is no evidence for pseudocyst formation or devascularization at this time.  Nonspecific perinephric stranding is noted bilaterally.  The kidneys are otherwise unremarkable in appearance.  There is no evidence of hydronephrosis.  No renal or ureteral stones are seen.  No free fluid is identified.  An apparent contrast-filled diverticulum is noted along the proximal ileum at the left mid abdomen; the remainder of the small bowel is unremarkable in appearance.  The stomach is within normal limits.  No acute vascular abnormalities are seen. Scattered calcification is noted along the abdominal aorta and its branches.  This is particularly prominent along the proximal superior mesenteric artery and at the origins of  both renal arteries, especially on the right.  The appendix is normal in caliber and contains air, without evidence for appendicitis.  Scattered diverticulosis is noted along the distal descending and sigmoid colon, without evidence of diverticulitis.  The colon is otherwise unremarkable in appearance.  The bladder is mildly distended and grossly unremarkable in appearance.  The prostate is enlarged, measuring 5.4 cm in transverse dimension; the prostate impresses on the base of the bladder, as on the prior study.  No inguinal lymphadenopathy is seen.  There is diffuse atrophy of the paraspinal musculature.  No acute osseous abnormalities are identified.  Vacuum phenomenon is noted at T10-T11 and at L4-L5, and there is disc space narrowing at L5-S1  IMPRESSION:  1.  Mild soft tissue inflammation noted about the second segment of the duodenum and adjacent pancreatic head; this could conceivably reflect very mild pancreatitis.  Suggest correlation with lab values.  No evidence for pseudocyst formation or devascularization at this time. 2.  Cholelithiasis; gallbladder otherwise unremarkable in appearance. 3.  Scattered calcification along the abdominal aorta and its branches; this is particularly prominent along the proximal superior mesenteric artery and at the origins of both renal arteries, especially on the right. 4.  Scattered diverticulosis along the distal descending sigmoid colon, without evidence of diverticulitis. 5.  Enlarged prostate, measuring 5.4 cm in transverse dimension, impressing on the base of the bladder. 6.  Diffuse atrophy of the paraspinal musculature. 7.  Mild degenerative change along the lower thoracic and lumbar spine.  Original Report Authenticated By: Tonia Ghent, M.D.    PREVIOUS ENDOSCOPIES: Colonoscopy approximately two years ago, maybe by Dr. Elnoria Howard.  IMPRESSION / PLAN: 1. Chronic intermittent abdominal pain, worse lately. CT scan shows mild soft tissue inflammation along the  second segment of the duodenum and adjacent pancreatic head possibly very mild pancreatitis, maybe duodenitis. CTscan images reviewed by Dr. Arlyce Dice, pancreas looks okay. Suspect this is duodenitis or duodenal ulcer. His lipase is normal. Patient takes [facility] powders on a regular basis making ulcer strong possibility. Ultrasound c/w cholelithiasis but doubt he has acute cholecystitis. Will proceed with EGD today for further evaluation.  2. Mild transaminitis. Ultrasound and CTscan suggest cirrhosis. Will evaluate for varices at time of EGD today 3.Chronic, intermittent loose stools. Sounds like patient had a colonoscopy approximately two years ago (?Dr Elnoria Howard). I cannot find report, may have been outpatient. Exam normal per patient. He may have IBS, diabetic enteropathy. 4. History of polio 5. Diabetes, on glucophage and insulin  6. HTN  Thanks   LOS: 1 day   Willette Cluster  07/28/2011, 11:25 AM

## 2011-07-28 NOTE — Progress Notes (Signed)
ED CM noted Admission RN's CM consult stating pt wants help in obtaining a lift chair: CM consulted with a Home health [facility]) DME coordinator  about the process to get lift chair.  CM informed pt of process and what is needed to get lift chair. Pt is aware of process from speaking with another vendor who did not complete the process for him. CM discussed need for him to choose a HH agency to get process started.  Pt stated he will let CM know his choice.  Pt also voiced interest in either having HH therapy from a "good company", going to outpatient therapy (cardiac/neuro rehab), inpt rehab or rehab snf for increase mobility States he only able to walk presently with a walker.

## 2011-07-28 NOTE — H&P (Signed)
Patient's PCP: Alva Garnet., MD, MD  Chief Complaint: Abdominal pain and loose bowel movements  History of Present Illness: Timothy Gross is a 71 y.o. African American male with history of hypertension, GERD, hyperlipidemia, type 2 diabetes, history of polio with polio syndrome who presents with the above complaints.  Patient reports that he has been having intermittent abdominal pain for approximately 2 years with loose bowel movements.  He reports that his symptoms have been worse over the last 3-4 weeks as a result presented to the emergency department for further evaluation.  Reports that he has sharp pain in the right upper quadrant and epigastric region constantly when he has these episodes of pain, pain is worse after eating.  He also reports feeling gassy at times and feels improved after having a bowel movement.  He has been having loose bowel movements which have been watery at times.  Denies any recent fevers, chills, chest pain, shortness of breath, headaches or vision changes.  Given his persistent pain he has not been able to eat anything or the last 3 days now as a result the hospitalist service was asked to admit the patient for further care and management.  Meds: Scheduled Meds:   . famotidine  20 mg Intravenous Once  . fentaNYL  50 mcg Intravenous Once  . fentaNYL  50 mcg Intravenous Once  . fentaNYL  50 mcg Intravenous Once  . gi cocktail  30 mL Oral Once  .  HYDROmorphone (DILAUDID) injection  1 mg Intravenous Once  . ondansetron (ZOFRAN) IV  4 mg Intravenous Once  . sodium chloride  700 mL Intravenous Once  . DISCONTD: sodium chloride   Intravenous Once  . DISCONTD: sodium chloride   Intravenous STAT   Continuous Infusions:   . sodium chloride    . DISCONTD: sodium chloride 125 mL/hr at 07/27/11 1921   PRN Meds:.HYDROmorphone (DILAUDID) injection, iohexol, ondansetron (ZOFRAN) IV Allergies: Review of patient's allergies indicates no known allergies. Past  Medical History  Diagnosis Date  . Diabetes mellitus   . Hypertension   . History of UTI   . Iron deficiency anemia   . DM (diabetes mellitus), type 2, uncontrolled   . Hyperlipidemia   . GERD (gastroesophageal reflux disease)   . History of post-polio syndrome    History reviewed. No pertinent past surgical history. Family History  Problem Relation Age of Onset  . Diabetes Mother   . Diabetes Father   . Diabetes Brother    History   Social History  . Marital Status: Single    Spouse Name: N/A    Number of Children: N/A  . Years of Education: N/A   Occupational History  . Not on file.   Social History Main Topics  . Smoking status: Former Games developer  . Smokeless tobacco: Not on file   Comment: Quit 30 years ago.  . Alcohol Use: No     Quit 15 years ago.  . Drug Use:   . Sexually Active:    Other Topics Concern  . Not on file   Social History Narrative  . No narrative on file   Review of Systems: All systems reviewed with the patient and positive as per history of present illness, otherwise all other systems are negative.  Physical Exam: Blood pressure 147/92, pulse 88, temperature 98 F (36.7 C), temperature source Oral, resp. rate 16, height 5\' 10"  (1.778 m), weight 131.543 kg (290 lb), SpO2 96.00%. General: Awake, Oriented x3, No acute distress. HEENT: EOMI,  Moist mucous membranes Neck: Supple CV: S1 and S2 Lungs: Clear to ascultation bilaterally Abdomen: Soft, Nontender, Nondistended, +bowel sounds. Ext: Good pulses. Trace edema. No clubbing or cyanosis noted. Neuro: Cranial Nerves II-XII grossly intact. Has 5/5 motor strength in upper extremities, has 3/5 motor strength in lower extremities bilaterally.  Lab results:  [facility] 07/27/11 1530  NA 131*  K 3.6  CL 92*  CO2 28  GLUCOSE 149*  BUN 5*  CREATININE 0.47*  CALCIUM 9.2  MG --  PHOS --    Basename 07/27/11 1530  AST 46*  ALT 77*  ALKPHOS 144*  BILITOT 0.4  PROT 7.2  ALBUMIN 3.4*     Basename 07/27/11 1530  LIPASE 19  AMYLASE --    Basename 07/27/11 1530  WBC 5.6  NEUTROABS 3.5  HGB 13.4  HCT 39.1  MCV 82.1  PLT 282    Basename 07/28/11 0805  CKTOTAL --  CKMB --  CKMBINDEX --  TROPONINI <0.30   No components found with this basename: POCBNP:3 No results found for this basename: DDIMER in the last 72 hours No results found for this basename: HGBA1C:2 in the last 72 hours No results found for this basename: CHOL:2,HDL:2,LDLCALC:2,TRIG:2,CHOLHDL:2,LDLDIRECT:2 in the last 72 hours No results found for this basename: TSH,T4TOTAL,FREET3,T3FREE,THYROIDAB in the last 72 hours No results found for this basename: VITAMINB12:2,FOLATE:2,FERRITIN:2,TIBC:2,IRON:2,RETICCTPCT:2 in the last 72 hours Imaging results:  US Abdomen Complete  07/28/2011  *RADIOLOGY REPORT*  Clinical Data:  Diabetes.  Hypertensive.  Abdominal pain.  COMPLETE ABDOMINAL ULTRASOUND  Comparison:  07/27/2011 CT.  Findings:  Gallbladder:  1.5 cm gallstone gallbladder neck level.  Gallbladder wall measures up to 2.7 mm.  No pericholecystic fluid.  The patient was not tender over this region during scanning.  Common bile duct:  7.3 mm.  Liver:  Dense with slightly lobulated contour raising possibility of cirrhosis.  IVC:  Appears normal.  Pancreas:  Not visualized secondary to bowel gas.  Spleen:  7.0 cm without focal mass.  Right Kidney:  10.7 cm. No hydronephrosis or renal mass.  Left Kidney:  10.9 cm. No hydronephrosis or renal mass.  Abdominal aorta:  Not well visualized secondary to bowel gas.  IMPRESSION: 1.5 cm gallstone gallbladder neck level without definitive findings of cholecystitis.  Question cirrhosis.  Limited evaluation of the pancreas and abdominal aorta secondary to bowel gas.  Original Report Authenticated By: Fuller Canada, M.D.   Ct Abdomen Pelvis W Contrast  07/28/2011  *RADIOLOGY REPORT*  Clinical Data: Abdominal pain and diarrhea.  History of sickle cell disease.  CT ABDOMEN AND  PELVIS WITH CONTRAST  Technique:  Multidetector CT imaging of the abdomen and pelvis was performed following the standard protocol during bolus administration of intravenous contrast.  Contrast: OMNIPAQUE IOHEXOL 300 MG/ML IV SOLN  Comparison: CT of the abdomen and pelvis performed 11/22/2010  Findings: Minimal bibasilar atelectasis is noted.  The liver and spleen are unremarkable in appearance.  A small stone is noted at the base of the gallbladder; the gallbladder is otherwise unremarkable in appearance.  The adrenal glands are unremarkable.  Note is made of mild soft tissue inflammation about the second segment of the duodenum and adjacent pancreatic head; this could conceivably reflect very mild pancreatitis.  There is no evidence for pseudocyst formation or devascularization at this time.  Nonspecific perinephric stranding is noted bilaterally.  The kidneys are otherwise unremarkable in appearance.  There is no evidence of hydronephrosis.  No renal or ureteral stones are seen.  No  free fluid is identified.  An apparent contrast-filled diverticulum is noted along the proximal ileum at the left mid abdomen; the remainder of the small bowel is unremarkable in appearance.  The stomach is within normal limits.  No acute vascular abnormalities are seen. Scattered calcification is noted along the abdominal aorta and its branches.  This is particularly prominent along the proximal superior mesenteric artery and at the origins of both renal arteries, especially on the right.  The appendix is normal in caliber and contains air, without evidence for appendicitis.  Scattered diverticulosis is noted along the distal descending and sigmoid colon, without evidence of diverticulitis.  The colon is otherwise unremarkable in appearance.  The bladder is mildly distended and grossly unremarkable in appearance.  The prostate is enlarged, measuring 5.4 cm in transverse dimension; the prostate impresses on the base of the  bladder, as on the prior study.  No inguinal lymphadenopathy is seen.  There is diffuse atrophy of the paraspinal musculature.  No acute osseous abnormalities are identified.  Vacuum phenomenon is noted at T10-T11 and at L4-L5, and there is disc space narrowing at L5-S1  IMPRESSION:  1.  Mild soft tissue inflammation noted about the second segment of the duodenum and adjacent pancreatic head; this could conceivably reflect very mild pancreatitis.  Suggest correlation with lab values.  No evidence for pseudocyst formation or devascularization at this time. 2.  Cholelithiasis; gallbladder otherwise unremarkable in appearance. 3.  Scattered calcification along the abdominal aorta and its branches; this is particularly prominent along the proximal superior mesenteric artery and at the origins of both renal arteries, especially on the right. 4.  Scattered diverticulosis along the distal descending sigmoid colon, without evidence of diverticulitis. 5.  Enlarged prostate, measuring 5.4 cm in transverse dimension, impressing on the base of the bladder. 6.  Diffuse atrophy of the paraspinal musculature. 7.  Mild degenerative change along the lower thoracic and lumbar spine.  Original Report Authenticated By: Tonia Ghent, M.D.   Other results: EKG: normal EKG, normal sinus rhythm, unchanged from previous tracings.  Assessment & Plan by Problem: 1. Abdominal pain.  Etiology unclear at this time.  May be due to cholelithiasis.  Given patient's history of GERD requested GI consultation for further evaluation.  Patient does not need urgent surgical evaluation at this time suspect the patient will need outpatient surgical evaluation for consideration of cholecystectomy.  Continue PPI.  2. Hypertension.  Stable continue home medications.  3.  Mild dehydration.  Likely due to poor by mouth intake from abdominal pain.  Continue gentle IV hydration.  4.  History of iron deficiency anemia.  Hemoglobin stable at this time.   Resolved.  5.  Type 2 diabetes uncontrolled.  Continue Lantus.  Hold metformin.  Will have the patient on sliding scale insulin.  6.  Hyperlipidemia.  Continue statin.  7.  History of GERD.  Continue PPI.  8.  Hyponatremia.  Maybe due to pain.  Monitor for now.  May need to discontinue hydrochlorothiazide if persistent hyponatremia.  9.  Generalized weakness and gait instability likely due to patient's history of post polio syndrome.  Will request PT/OT evaluation.  10.  Prophylaxis.  Lovenox.  11.  CODE STATUS.  Full code.  Time spent on admission, talking to the patient, and coordinating care was: 45 mins.  Nickolaus Bordelon A, MD 07/28/2011, 9:15 AM

## 2011-07-28 NOTE — ED Notes (Signed)
[id number]<JY> Expected date:<BR> Expected time:<BR> Means of arrival:<BR> Comments:<BR> Patient in endo

## 2011-07-29 ENCOUNTER — Encounter (HOSPITAL_COMMUNITY): Payer: Self-pay | Admitting: Gastroenterology

## 2011-07-29 LAB — CBC
HCT: 36.6 % — ABNORMAL LOW (ref 39.0–52.0)
Hemoglobin: 12.5 g/dL — ABNORMAL LOW (ref 13.0–17.0)
MCV: 82.4 fL (ref 78.0–100.0)
RBC: 4.44 MIL/uL (ref 4.22–5.81)
WBC: 5.2 10*3/uL (ref 4.0–10.5)

## 2011-07-29 LAB — GLUCOSE, CAPILLARY
Glucose-Capillary: 91 mg/dL (ref 70–99)
Glucose-Capillary: 147 mg/dL — ABNORMAL HIGH (ref 70–99)
Glucose-Capillary: 169 mg/dL — ABNORMAL HIGH (ref 70–99)
Glucose-Capillary: 313 mg/dL — ABNORMAL HIGH (ref 70–99)

## 2011-07-29 LAB — COMPREHENSIVE METABOLIC PANEL
Albumin: 3.1 g/dL — ABNORMAL LOW (ref 3.5–5.2)
Alkaline Phosphatase: 146 U/L — ABNORMAL HIGH (ref 39–117)
BUN: 4 mg/dL — ABNORMAL LOW (ref 6–23)
Calcium: 8.8 mg/dL (ref 8.4–10.5)
GFR calc Af Amer: >90 mL/min (ref >90)
Glucose, Bld: 125 mg/dL — ABNORMAL HIGH (ref 70–99)
Potassium: 3.6 mEq/L (ref 3.5–5.1)
Total Protein: 6.7 g/dL (ref 6.0–8.3)

## 2011-07-29 MED ORDER — HYOSCYAMINE SULFATE ER 0.375 MG PO TB12
0.3750 mg | ORAL_TABLET | Freq: Two times a day (BID) | ORAL | Status: DC
  Administered 2011-07-29 – 2011-07-30 (×3): 0.375 mg via ORAL
  Filled 2011-07-29 (×4): qty 1

## 2011-07-29 MED ORDER — ENOXAPARIN SODIUM 60 MG/0.6ML ~~LOC~~ SOLN
60.0000 mg | SUBCUTANEOUS | Status: DC
  Administered 2011-07-29: 60 mg via SUBCUTANEOUS
  Filled 2011-07-29 (×2): qty 0.6

## 2011-07-29 MED FILL — Glycopyrrolate Inj 0.2 MG/ML: INTRAMUSCULAR | Qty: 1 | Status: AC

## 2011-07-29 MED FILL — Diphenhydramine HCl Inj 50 MG/ML: INTRAMUSCULAR | Qty: 1 | Status: AC

## 2011-07-29 NOTE — Evaluation (Signed)
Occupational Therapy Evaluation Patient Details Name: Timothy Gross MRN: [id number] DOB: 16-May-1941 Today's Date: 07/29/2011  Problem List:  Patient Active Problem List  Diagnoses  . Hypertension  . History of UTI  . Iron deficiency anemia  . DM (diabetes mellitus), type 2, uncontrolled  . Hyperlipidemia  . GERD (gastroesophageal reflux disease)  . Hyponatremia  . Elevated LFTs  . Abdominal pain  . Generalized weakness  . History of post-polio syndrome  . Dehydration    Past Medical History:  Past Medical History  Diagnosis Date  . Diabetes mellitus   . Hypertension   . History of UTI   . Hyperlipidemia   . GERD (gastroesophageal reflux disease)   . History of post-polio syndrome   . H/O acute alcoholic hepatitis   . Cervical myelopathy     Hx of  . Right patella fracture   . Right sided weakness   . Iron deficiency anemia    Past Surgical History:  Past Surgical History  Procedure Date  . Cervical fusion 04/16/2003  . Orif right femur fracture   . Radioactive iodine treatment 1984  . Esophagogastroduodenoscopy 07/28/2011    Procedure: ESOPHAGOGASTRODUODENOSCOPY (EGD);  Surgeon: Louis Meckel, MD;  Location: [facility];  Service: Endoscopy;  Laterality: N/A;    OT Assessment/Plan/Recommendation OT Assessment Clinical Impression Statement: Pt is a 71 yo male who presents with abdominal pain, post polio syndrome. Skilled OT recommended to maximize I w/BADLs to supervision level in prep for d/c home with HHOT. OT Recommendation/Assessment: Patient will need skilled OT in the acute care venue OT Problem List: Decreased strength;Decreased activity tolerance;Decreased knowledge of use of DME or AE;Decreased safety awareness Barriers to Discharge: Decreased caregiver support OT Therapy Diagnosis : Generalized weakness OT Plan OT Frequency: Min 2X/week OT Treatment/Interventions: Self-care/ADL training;Therapeutic activities;DME and/or AE  instruction;Patient/family education OT Recommendation Follow Up Recommendations: Home health OT and HH aide Equipment Recommended: None recommended by OT Individuals Consulted Consulted and Agree with Results and Recommendations: Patient OT Goals Acute Rehab OT Goals OT Goal Formulation: With patient Time For Goal Achievement: 2 weeks ADL Goals Pt Will Perform Grooming: with supervision;Standing at sink (X 3 tasks to improve standing activity tolerance.) ADL Goal: Grooming - Progress: Goal set today Pt Will Perform Lower Body Bathing: with supervision;Sit to stand from chair;Sit to stand from bed;with adaptive equipment ADL Goal: Lower Body Bathing - Progress: Goal set today Pt Will Perform Lower Body Dressing: with supervision;Sit to stand from chair;Sit to stand from bed ADL Goal: Lower Body Dressing - Progress: Goal set today Pt Will Transfer to Toilet: with supervision;Ambulation;Extra wide 3-in-1 ADL Goal: Toilet Transfer - Progress: Goal set today Pt Will Perform Toileting - Clothing Manipulation: with supervision;Standing ADL Goal: Toileting - Clothing Manipulation - Progress: Goal set today Pt Will Perform Toileting - Hygiene: with supervision;Sit to stand from 3-in-1/toilet ADL Goal: Toileting - Hygiene - Progress: Goal set today  OT Evaluation Precautions/Restrictions  Precautions Precautions: Fall Restrictions Weight Bearing Restrictions: No Prior Functioning Home Living Lives With: Other (Comment) (70 yo brother) Type of Home: House Home Layout: One level Home Access: Ramped entrance Bathroom Shower/Tub: Tub/shower unit (spongebathes) Bathroom Toilet: Standard Home Adaptive Equipment: Bedside commode/3-in-1;Walker - rolling;Wheelchair - manual Prior Function Level of Independence: Independent with basic ADLs;Needs assistance with homemaking;Requires assistive device for independence;Independent with transfers;Independent with gait Light Housekeeping:  Total Driving: Yes Vocation: Retired ADL ADL Grooming: Simulated;Set up Where Assessed - Grooming: Sitting, bed;Unsupported Upper Body Bathing: Simulated;Set up Where Assessed - Upper Body  Bathing: Sitting, bed;Unsupported Lower Body Bathing: Simulated;Moderate assistance Where Assessed - Lower Body Bathing: Sit to stand from bed Upper Body Dressing: Simulated;Set up Where Assessed - Upper Body Dressing: Sitting, bed;Unsupported Lower Body Dressing: Simulated;Moderate assistance Where Assessed - Lower Body Dressing: Sit to stand from bed Toilet Transfer: Simulated;+2 Total assistance;Comment for patient % (Pt 80%) Toilet Transfer Method: Ambulating Toileting - Clothing Manipulation: Simulated;Moderate assistance Where Assessed - Toileting Clothing Manipulation: Standing Toileting - Hygiene: Simulated;Moderate assistance Where Assessed - Toileting Hygiene: Standing Tub/Shower Transfer: Not assessed Tub/Shower Transfer Method: Not assessed Equipment Used: Rolling walker ADL Comments: Feel pt is very close to his baseline. Pt very talkative, wants to try transfers on his own before allowing therapist to assist. Vision/Perception    Cognition Cognition Arousal/Alertness: Awake/alert Overall Cognitive Status: Appears within functional limits for tasks assessed Orientation Level: Oriented X4 Sensation/Coordination   Extremity Assessment RUE Assessment RUE Assessment: Exceptions to [facility] RUE AROM (degrees) Overall AROM Right Upper Extremity: Deficits;Due to premorbid status RUE Overall AROM Comments: ~60 degrees of shoulder flexion, WFL distally RUE Strength RUE Overall Strength: Deficits;Due to premorbid status RUE Overall Strength Comments: 3/5 at shoulder, WFL distally LUE Assessment LUE Assessment: Exceptions to [facility] LUE AROM (degrees) Overall AROM Left Upper Extremity: Deficits;Due to premorbid status LUE Overall AROM Comments: ~60 degrees of shoulder flexion WFL  distally LUE Strength LUE Overall Strength: Deficits;Due to premorbid status LUE Overall Strength Comments: 3/5 shoulder,  WFL distally. Mobility  Bed Mobility Bed Mobility: Yes Supine to Sit: 6: Modified independent (Device/Increase time);HOB elevated (Comment degrees) (55 degrees) Sitting - Scoot to Edge of Bed: 6: Modified independent (Device/Increase time) Transfers Sit to Stand: From bed;From elevated surface;With upper extremity assist;1: +2 Total assist;Patient percentage (comment) (Pt 80%) Exercises   End of Session OT - End of Session Equipment Utilized During Treatment: Gait belt Activity Tolerance: Patient tolerated treatment well Patient left: in chair;with call bell in reach General Behavior During Session: [facility] for tasks performed Cognition: [facility] for tasks performed   Jacquiline Zurcher A OTR/L [phone] 07/29/2011, 11:20 AM

## 2011-07-29 NOTE — Evaluation (Addendum)
Physical Therapy Evaluation (Co-eval with OT) Patient Details Name: Timothy Gross MRN: [id number] DOB: Mar 21, 1941 Today's Date: 07/29/2011  Problem List:  Patient Active Problem List  Diagnoses  . Hypertension  . History of UTI  . Iron deficiency anemia  . DM (diabetes mellitus), type 2, uncontrolled  . Hyperlipidemia  . GERD (gastroesophageal reflux disease)  . Hyponatremia  . Elevated LFTs  . Abdominal pain  . Generalized weakness  . History of post-polio syndrome  . Dehydration    Past Medical History:  Past Medical History  Diagnosis Date  . Diabetes mellitus   . Hypertension   . History of UTI   . Hyperlipidemia   . GERD (gastroesophageal reflux disease)   . History of post-polio syndrome   . H/O acute alcoholic hepatitis   . Cervical myelopathy     Hx of  . Right patella fracture   . Right sided weakness   . Iron deficiency anemia    Past Surgical History:  Past Surgical History  Procedure Date  . Cervical fusion 04/16/2003  . Orif right femur fracture   . Radioactive iodine treatment 1984  . Esophagogastroduodenoscopy 07/28/2011    Procedure: ESOPHAGOGASTRODUODENOSCOPY (EGD);  Surgeon: Louis Meckel, MD;  Location: [facility];  Service: Endoscopy;  Laterality: N/A;    PT Assessment/Plan/Recommendation PT Assessment Clinical Impression Statement: 71 y.o. male presents to [facility] for abdominal pain and diarrhea.  He presents with significant gait deficits (secondary to history of polio and now increased weakness), generalized weakness, decreased mobility and independence, decreased balacne and decreasaed activity tolerance.  He would benefit from acute PT to maximize his independence, functional mobility and safety so that he may be able to return home safely.   PT Recommendation/Assessment: Patient will need skilled PT in the acute care venue PT Problem List: Decreased strength;Decreased activity tolerance;Decreased balance;Decreased mobility PT Therapy  Diagnosis : Difficulty walking;Abnormality of gait;Generalized weakness;Acute pain PT Plan PT Frequency: Min 3X/week PT Treatment/Interventions: DME instruction;Gait training;Functional mobility training;Therapeutic activities;Therapeutic exercise;Balance training;Neuromuscular re-education;Patient/family education PT Recommendation Follow Up Recommendations: Home health PT Equipment Recommended: lift chair PT Goals  Acute Rehab PT Goals PT Goal Formulation: With patient Time For Goal Achievement: 2 weeks Pt will go Supine/Side to Sit: with modified independence;with HOB 0 degrees PT Goal: Supine/Side to Sit - Progress: Goal set today Pt will go Sit to Supine/Side: with modified independence PT Goal: Sit to Supine/Side - Progress: Goal set today Pt will go Sit to Stand: with modified independence PT Goal: Sit to Stand - Progress: Goal set today Pt will go Stand to Sit: with modified independence PT Goal: Stand to Sit - Progress: Goal set today Pt will Transfer Bed to Chair/Chair to Bed: with modified independence PT Transfer Goal: Bed to Chair/Chair to Bed - Progress: Goal set today Pt will Ambulate: 16 - 50 feet;with modified independence;with rolling walker PT Goal: Ambulate - Progress: Goal set today  PT Evaluation Precautions/Restrictions  Precautions Precautions: Fall Restrictions Weight Bearing Restrictions: No Prior Functioning  Home Living Lives With: Other (Comment) (77 yo brother) Type of Home: House Home Layout: One level Home Access: Ramped entrance Bathroom Shower/Tub: Tub/shower unit (spongebathes) Bathroom Toilet: Standard Home Adaptive Equipment: Bedside commode/3-in-1;Walker - rolling;Wheelchair - manual Prior Function Level of Independence: Independent with basic ADLs;Needs assistance with homemaking;Requires assistive device for independence;Independent with transfers;Independent with gait Light Housekeeping: Total Driving: Yes Vocation:  Retired Producer, television/film/video: Awake/alert Overall Cognitive Status: Appears within functional limits for tasks assessed Orientation Level: Oriented X4 Extremity Assessment  RLE Assessment RLE Assessment: Exceptions to [facility] RLE Strength RLE Overall Strength: Deficits RLE Overall Strength Comments: this was his most affected leg by polio, externally rotated at rest and with mobility, 2/5 ankle, 3-/5 knee, 3-/5 hip per gross functional assessment.   LLE Assessment LLE Assessment: Exceptions to [facility] LLE Strength LLE Overall Strength: Deficits LLE Overall Strength Comments: grossly 3+/5 throughout per gross functional assessment.   Mobility (including Balance) Bed Mobility Bed Mobility: Yes Supine to Sit: 6: Modified independent (Device/Increase time);HOB elevated (Comment degrees) (55 degrees) Sitting - Scoot to Edge of Bed: 6: Modified independent (Device/Increase time) Transfers Transfers: Yes Sit to Stand: From bed;From elevated surface;With upper extremity assist;1: +2 Total assist;Patient percentage (comment) (Pt 80%) Ambulation/Gait Ambulation/Gait: Yes Ambulation/Gait Assistance: 5: Supervision Ambulation/Gait Assistance Details (indicate cue type and reason): close supervisio, patient stating "back up", very flexed posture over RW secondary to patient needs to have wide BOS to swing using momentum his right leg and it is externally rotated.  Chair to follow for safety.  Ambulation Distance (Feet): 35 Feet Assistive device: Rolling walker Gait Pattern: Right circumduction;Trunk flexed    End of Session PT - End of Session Equipment Utilized During Treatment: Gait belt (wide RW left in room) Activity Tolerance: Patient limited by fatigue Patient left: in chair;with call bell in reach Nurse Communication: Mobility status for transfers (need 2 person assist to get up from chair) General Behavior During Session: [facility] for tasks performed Cognition: [facility] for tasks  performed  Quintasha Gren B. Elyssa Pendelton, PT, DPT [phone] 07/29/2011, 11:58 AM

## 2011-07-29 NOTE — Progress Notes (Signed)
   CARE MANAGEMENT NOTE 07/29/2011  Patient:  Timothy Gross, Timothy Gross   Account Number:  [id number]  Date Initiated:  07/29/2011  Documentation initiated by:  [facility]  Subjective/Objective Assessment:   ADMITTED W/ABD PAIN,LOOSE BM.HX:HTN.     Action/Plan:   FROM HOME W/BROTHER.HAS CANE,RW,PHARMACY,TRANSP.   Anticipated DC Date:  07/30/2011   Anticipated DC Plan:  HOME/SELF CARE         Choice offered to / List presented to:             Status of service:  In process, will continue to follow Medicare Important Message given?   (If response is "NO", the following Medicare IM given date fields will be blank) Date Medicare IM given:   Date Additional Medicare IM given:    Discharge Disposition:    Per UR Regulation:  Reviewed for med. necessity/level of care/duration of stay  Comments:  07/29/11 Timothy Mcshea RN,BSN NCM [phone] PATIENT INTERESTED IN CHAIR LIFT,TC [facility] WILL TALK TO PATIENT ABOUT THE PROCESS.

## 2011-07-29 NOTE — Progress Notes (Signed)
   CARE MANAGEMENT NOTE 07/29/2011  Patient:  Timothy Gross, Timothy Gross   Account Number:  [id number]  Date Initiated:  07/29/2011  Documentation initiated by:  [facility]  Subjective/Objective Assessment:   ADMITTED W/ABD PAIN,LOOSE BM.HX:HTN.     Action/Plan:   FROM HOME W/BROTHER.HAS CANE,RW,PHARMACY,TRANSP.   Anticipated DC Date:  07/30/2011   Anticipated DC Plan:  HOME W HOME HEALTH SERVICES         Choice offered to / List presented to:  C-1 Patient           Status of service:  In process, will continue to follow Medicare Important Message given?   (If response is "NO", the following Medicare IM given date fields will be blank) Date Medicare IM given:   Date Additional Medicare IM given:    Discharge Disposition:    Per UR Regulation:  Reviewed for med. necessity/level of care/duration of stay  Comments:  07/29/11 [facility] RN,BSN NCM [phone] PROVIDED PATIENT W/HHC LIST,ORDERED FOR HHPT/OT/NURSE'S AIDE,& LIFT CHAIR. PATIENT INTERESTED IN CHAIR LIFT,TC [facility] WILL TALK TO PATIENT ABOUT THE PROCESS.

## 2011-07-29 NOTE — Progress Notes (Signed)
Subjective: Patient complaining of abdominal pain.  Tolerating a diet so far.  Objective: Vital signs in last 24 hours: Filed Vitals:   07/28/11 1815 07/28/11 2030 07/29/11 0859 07/29/11 1502  BP: 148/68 166/81  149/81  Pulse: 98 91  68  Temp: 98.8 F (37.1 C) 98.1 F (36.7 C)  97.9 F (36.6 C)  TempSrc: Oral Oral  Oral  Resp:  20  18  Height:  5\' 10"  (1.778 m)    Weight:  121.2 kg (267 lb 3.2 oz) 118.407 kg (261 lb 0.6 oz)   SpO2: 96% 98%  96%   Weight change: -10.343 kg (-22 lb 12.8 oz)  Intake/Output Summary (Last 24 hours) at 07/29/11 1705 Last data filed at 07/29/11 1504  Gross per 24 hour  Intake 1292.5 ml  Output   1825 ml  Net -532.5 ml    Physical Exam: General: Awake, Oriented, No acute distress. HEENT: EOMI. Neck: Supple CV: S1 and S2 Lungs: Clear to ascultation bilaterally Abdomen: Soft, Nontender, Nondistended, +bowel sounds. Ext: Good pulses. Trace edema.  Lab Results:  [facility] 07/29/11 0455 07/27/11 1530  NA 133* 131*  K 3.6 3.6  CL 95* 92*  CO2 30 28  GLUCOSE 125* 149*  BUN 4* 5*  CREATININE 0.43* 0.47*  CALCIUM 8.8 9.2  MG -- --  PHOS -- --    Basename 07/29/11 0455 07/27/11 1530  AST 27 46*  ALT 46 77*  ALKPHOS 146* 144*  BILITOT 0.7 0.4  PROT 6.7 7.2  ALBUMIN 3.1* 3.4*    Basename 07/29/11 0455 07/27/11 1530  LIPASE 16 19  AMYLASE -- --    Basename 07/29/11 0455 07/27/11 1530  WBC 5.2 5.6  NEUTROABS -- 3.5  HGB 12.5* 13.4  HCT 36.6* 39.1  MCV 82.4 82.1  PLT 231 282    Basename 07/28/11 0805  CKTOTAL --  CKMB --  CKMBINDEX --  TROPONINI <0.30   No components found with this basename: POCBNP:3 No results found for this basename: DDIMER:2 in the last 72 hours  Basename 07/27/11 1530  HGBA1C 9.0*   No results found for this basename: CHOL:2,HDL:2,LDLCALC:2,TRIG:2,CHOLHDL:2,LDLDIRECT:2 in the last 72 hours No results found for this basename: TSH,T4TOTAL,FREET3,T3FREE,THYROIDAB in the last 72 hours No results  found for this basename: VITAMINB12:2,FOLATE:2,FERRITIN:2,TIBC:2,IRON:2,RETICCTPCT:2 in the last 72 hours  Micro Results: No results found for this or any previous visit (from the past 240 hour(s)).  Studies/Results: US Abdomen Complete  07/28/2011  *RADIOLOGY REPORT*  Clinical Data:  Diabetes.  Hypertensive.  Abdominal pain.  COMPLETE ABDOMINAL ULTRASOUND  Comparison:  07/27/2011 CT.  Findings:  Gallbladder:  1.5 cm gallstone gallbladder neck level.  Gallbladder wall measures up to 2.7 mm.  No pericholecystic fluid.  The patient was not tender over this region during scanning.  Common bile duct:  7.3 mm.  Liver:  Dense with slightly lobulated contour raising possibility of cirrhosis.  IVC:  Appears normal.  Pancreas:  Not visualized secondary to bowel gas.  Spleen:  7.0 cm without focal mass.  Right Kidney:  10.7 cm. No hydronephrosis or renal mass.  Left Kidney:  10.9 cm. No hydronephrosis or renal mass.  Abdominal aorta:  Not well visualized secondary to bowel gas.  IMPRESSION: 1.5 cm gallstone gallbladder neck level without definitive findings of cholecystitis.  Question cirrhosis.  Limited evaluation of the pancreas and abdominal aorta secondary to bowel gas.  Original Report Authenticated By: Fuller Canada, M.D.   Ct Abdomen Pelvis W Contrast  07/28/2011  *RADIOLOGY REPORT*  Clinical Data: Abdominal pain and diarrhea.  History of sickle cell disease.  CT ABDOMEN AND PELVIS WITH CONTRAST  Technique:  Multidetector CT imaging of the abdomen and pelvis was performed following the standard protocol during bolus administration of intravenous contrast.  Contrast: OMNIPAQUE IOHEXOL 300 MG/ML IV SOLN  Comparison: CT of the abdomen and pelvis performed 11/22/2010  Findings: Minimal bibasilar atelectasis is noted.  The liver and spleen are unremarkable in appearance.  A small stone is noted at the base of the gallbladder; the gallbladder is otherwise unremarkable in appearance.  The adrenal glands are  unremarkable.  Note is made of mild soft tissue inflammation about the second segment of the duodenum and adjacent pancreatic head; this could conceivably reflect very mild pancreatitis.  There is no evidence for pseudocyst formation or devascularization at this time.  Nonspecific perinephric stranding is noted bilaterally.  The kidneys are otherwise unremarkable in appearance.  There is no evidence of hydronephrosis.  No renal or ureteral stones are seen.  No free fluid is identified.  An apparent contrast-filled diverticulum is noted along the proximal ileum at the left mid abdomen; the remainder of the small bowel is unremarkable in appearance.  The stomach is within normal limits.  No acute vascular abnormalities are seen. Scattered calcification is noted along the abdominal aorta and its branches.  This is particularly prominent along the proximal superior mesenteric artery and at the origins of both renal arteries, especially on the right.  The appendix is normal in caliber and contains air, without evidence for appendicitis.  Scattered diverticulosis is noted along the distal descending and sigmoid colon, without evidence of diverticulitis.  The colon is otherwise unremarkable in appearance.  The bladder is mildly distended and grossly unremarkable in appearance.  The prostate is enlarged, measuring 5.4 cm in transverse dimension; the prostate impresses on the base of the bladder, as on the prior study.  No inguinal lymphadenopathy is seen.  There is diffuse atrophy of the paraspinal musculature.  No acute osseous abnormalities are identified.  Vacuum phenomenon is noted at T10-T11 and at L4-L5, and there is disc space narrowing at L5-S1  IMPRESSION:  1.  Mild soft tissue inflammation noted about the second segment of the duodenum and adjacent pancreatic head; this could conceivably reflect very mild pancreatitis.  Suggest correlation with lab values.  No evidence for pseudocyst formation or devascularization  at this time. 2.  Cholelithiasis; gallbladder otherwise unremarkable in appearance. 3.  Scattered calcification along the abdominal aorta and its branches; this is particularly prominent along the proximal superior mesenteric artery and at the origins of both renal arteries, especially on the right. 4.  Scattered diverticulosis along the distal descending sigmoid colon, without evidence of diverticulitis. 5.  Enlarged prostate, measuring 5.4 cm in transverse dimension, impressing on the base of the bladder. 6.  Diffuse atrophy of the paraspinal musculature. 7.  Mild degenerative change along the lower thoracic and lumbar spine.  Original Report Authenticated By: Tonia Ghent, M.D.    Medications: I have reviewed the patient's current medications. Scheduled Meds:   . benazepril  40 mg Oral BID  . enoxaparin (LOVENOX) injection  60 mg Subcutaneous Q24H  . hydrochlorothiazide  25 mg Oral Daily  . hyoscyamine  0.375 mg Oral Q12H  . insulin aspart  0-9 Units Subcutaneous TID WC  . insulin glargine  25 Units Subcutaneous QHS  . metoprolol  50 mg Oral BID  . mulitivitamin with minerals  1 tablet Oral Daily  .  pantoprazole  80 mg Oral BID AC  . simvastatin  40 mg Oral QPM  . white petrolatum      . DISCONTD: enoxaparin  40 mg Subcutaneous Q24H  . DISCONTD:  HYDROmorphone (DILAUDID) injection  1 mg Intravenous Once   Continuous Infusions:   . sodium chloride Stopped (07/28/11 2300)   PRN Meds:.acetaminophen, acetaminophen, alum & mag hydroxide-simeth, HYDROcodone-acetaminophen, morphine, ondansetron (ZOFRAN) IV, ondansetron, polyvinyl alcohol, DISCONTD: dextran 70-hypromellose, DISCONTD:  HYDROmorphone (DILAUDID) injection, DISCONTD: ondansetron (ZOFRAN) IV  Assessment/Plan: Abdominal pain.  Patient had EGD on February 21 of 2013 which showed normal EGD.  Appreciate GI input.  May be due to cholelithiasis. The patient will need outpatient surgical evaluation for consideration of cholecystectomy.  Continue PPI.   Hypertension. Stable continue home medications.   Mild dehydration. Likely due to poor by mouth intake from abdominal pain. Continue gentle IV hydration.   History of iron deficiency anemia. Hemoglobin stable at this time. Resolved.   Type 2 diabetes uncontrolled. Continue Lantus. Hold metformin. Will have the patient on sliding scale insulin.   Hyperlipidemia. Continue statin.   History of GERD. Continue PPI.   Hyponatremia. Maybe due to pain. Monitor for now. May need to discontinue hydrochlorothiazide if persistent hyponatremia.   Generalized weakness and gait instability likely due to patient's history of post polio syndrome.  Will arrange for home health PT/OT.  Prophylaxis. Lovenox.   CODE STATUS. Full code.   LOS: 2 days  Takima Encina A, MD 07/29/2011, 5:05 PM

## 2011-07-29 NOTE — Progress Notes (Signed)
   CARE MANAGEMENT NOTE 07/29/2011  Patient:  Timothy Gross, Timothy Gross   Account Number:  [id number]  Date Initiated:  07/29/2011  Documentation initiated by:  [facility]  Subjective/Objective Assessment:   ADMITTED W/ABD PAIN,LOOSE BM.HX:HTN.     Action/Plan:   FROM HOME W/BROTHER.HAS CANE,RW,PHARMACY,TRANSP.   Anticipated DC Date:  07/30/2011   Anticipated DC Plan:  HOME W HOME HEALTH SERVICES         Choice offered to / List presented to:  C-1 Patient      DME agency  [facility]     HH arranged  HH-2 PT  HH-3 OT  HH-4 NURSE'S AIDE      HH agency  [facility]   Status of service:  In process, will continue to follow Medicare Important Message given?   (If response is "NO", the following Medicare IM given date fields will be blank) Date Medicare IM given:   Date Additional Medicare IM given:    Discharge Disposition:    Per UR Regulation:  Reviewed for med. necessity/level of care/duration of stay  Comments:  07/29/11 Timothy Wilms RN,BSN NCM [phone] [facility] CHOSEN([facility]),& FOLLOWING FOR HHPT/OT/AIDE,LIFT CHAIR. PROVIDED PATIENT W/[facility]HC LIST,ORDERED FOR HHPT/OT/NURSE'S AIDE,& LIFT CHAIR. PATIENT INTERESTED IN CHAIR LIFT,TC [facility]Y WILL TALK TO PATIENT ABOUT THE PROCESS.

## 2011-07-29 NOTE — Progress Notes (Signed)
Subjective: *Patient continues to complain of abdominal pain, particularly postprandially.**  Objective: Vital signs in last 24 hours: Temp:  [98.1 F (36.7 C)-98.8 F (37.1 C)] 98.1 F (36.7 C) (02/21 2030) Pulse Rate:  [81-98] 91  (02/21 2030) Resp:  [8-36] 20  (02/21 2030) BP: (139-171)/(68-93) 166/81 mmHg (02/21 2030) SpO2:  [96 %-100 %] 98 % (02/21 2030) Weight:  [261 lb 0.6 oz (118.407 kg)-267 lb 3.2 oz (121.2 kg)] 261 lb 0.6 oz (118.407 kg) (02/22 0859) Last BM Date: 07/27/11 General:   Alert,  Well-developed, well-nourished, pleasant and cooperative in NAD Head:  Normocephalic and atraumatic. Eyes:  Sclera clear, no icterus.   Conjunctiva pink. Mouth:  No deformity or lesions, dentition normal. Neck:  Supple; no masses or thyromegaly. Heart:  Regular rate and rhythm; no murmurs, clicks, rubs,  or gallops. Abdomen:  Soft, nontender and nondistended. No masses, hepatosplenomegaly or hernias noted. Normal bowel sounds, without guarding, and without rebound.   Msk:  Symmetrical without gross deformities. Normal posture. Pulses:  Normal pulses noted. Extremities:  Without clubbing or edema. Neurologic:  Alert and  oriented x4;  grossly normal neurologically. Skin:  Intact without significant lesions or rashes. Cervical Nodes:  No significant cervical adenopathy. Psych:  Alert and cooperative. Normal mood and affect.  Intake/Output from previous day: 02/21 0701 - 02/22 0700 In: 812.5 [I.V.:812.5] Out: 1325 [Urine:1325] Intake/Output this shift: Total I/O In: 240 [P.O.:240] Out: 200 [Urine:200]  Lab Results:  [facility] 07/29/11 0455 07/27/11 1530  WBC 5.2 5.6  HGB 12.5* 13.4  HCT 36.6* 39.1  PLT 231 282   BMET  Basename 07/29/11 0455 07/27/11 1530  NA 133* 131*  K 3.6 3.6  CL 95* 92*  CO2 30 28  GLUCOSE 125* 149*  BUN 4* 5*  CREATININE 0.43* 0.47*  CALCIUM 8.8 9.2   LFT  Basename 07/29/11 0455 07/27/11 1530  PROT 6.7 --  ALBUMIN 3.1* --  AST 27 --  ALT  46 --  ALKPHOS 146* --  BILITOT 0.7 --  BILIDIR -- 0.1  IBILI -- 0.3   PT/INR No results found for this basename: LABPROT:2,INR:2 in the last 72 hours Hepatitis Panel No results found for this basename: HEPBSAG,HCVAB,HEPAIGM,HEPBIGM in the last 72 hours   Studies/Results: US Abdomen Complete  07/28/2011  *RADIOLOGY REPORT*  Clinical Data:  Diabetes.  Hypertensive.  Abdominal pain.  COMPLETE ABDOMINAL ULTRASOUND  Comparison:  07/27/2011 CT.  Findings:  Gallbladder:  1.5 cm gallstone gallbladder neck level.  Gallbladder wall measures up to 2.7 mm.  No pericholecystic fluid.  The patient was not tender over this region during scanning.  Common bile duct:  7.3 mm.  Liver:  Dense with slightly lobulated contour raising possibility of cirrhosis.  IVC:  Appears normal.  Pancreas:  Not visualized secondary to bowel gas.  Spleen:  7.0 cm without focal mass.  Right Kidney:  10.7 cm. No hydronephrosis or renal mass.  Left Kidney:  10.9 cm. No hydronephrosis or renal mass.  Abdominal aorta:  Not well visualized secondary to bowel gas.  IMPRESSION: 1.5 cm gallstone gallbladder neck level without definitive findings of cholecystitis.  Question cirrhosis.  Limited evaluation of the pancreas and abdominal aorta secondary to bowel gas.  Original Report Authenticated By: Fuller Canada, M.D.   Ct Abdomen Pelvis W Contrast  07/28/2011  *RADIOLOGY REPORT*  Clinical Data: Abdominal pain and diarrhea.  History of sickle cell disease.  CT ABDOMEN AND PELVIS WITH CONTRAST  Technique:  Multidetector CT imaging of the abdomen  and pelvis was performed following the standard protocol during bolus administration of intravenous contrast.  Contrast: OMNIPAQUE IOHEXOL 300 MG/ML IV SOLN  Comparison: CT of the abdomen and pelvis performed 11/22/2010  Findings: Minimal bibasilar atelectasis is noted.  The liver and spleen are unremarkable in appearance.  A small stone is noted at the base of the gallbladder; the gallbladder is  otherwise unremarkable in appearance.  The adrenal glands are unremarkable.  Note is made of mild soft tissue inflammation about the second segment of the duodenum and adjacent pancreatic head; this could conceivably reflect very mild pancreatitis.  There is no evidence for pseudocyst formation or devascularization at this time.  Nonspecific perinephric stranding is noted bilaterally.  The kidneys are otherwise unremarkable in appearance.  There is no evidence of hydronephrosis.  No renal or ureteral stones are seen.  No free fluid is identified.  An apparent contrast-filled diverticulum is noted along the proximal ileum at the left mid abdomen; the remainder of the small bowel is unremarkable in appearance.  The stomach is within normal limits.  No acute vascular abnormalities are seen. Scattered calcification is noted along the abdominal aorta and its branches.  This is particularly prominent along the proximal superior mesenteric artery and at the origins of both renal arteries, especially on the right.  The appendix is normal in caliber and contains air, without evidence for appendicitis.  Scattered diverticulosis is noted along the distal descending and sigmoid colon, without evidence of diverticulitis.  The colon is otherwise unremarkable in appearance.  The bladder is mildly distended and grossly unremarkable in appearance.  The prostate is enlarged, measuring 5.4 cm in transverse dimension; the prostate impresses on the base of the bladder, as on the prior study.  No inguinal lymphadenopathy is seen.  There is diffuse atrophy of the paraspinal musculature.  No acute osseous abnormalities are identified.  Vacuum phenomenon is noted at T10-T11 and at L4-L5, and there is disc space narrowing at L5-S1  IMPRESSION:  1.  Mild soft tissue inflammation noted about the second segment of the duodenum and adjacent pancreatic head; this could conceivably reflect very mild pancreatitis.  Suggest correlation with lab  values.  No evidence for pseudocyst formation or devascularization at this time. 2.  Cholelithiasis; gallbladder otherwise unremarkable in appearance. 3.  Scattered calcification along the abdominal aorta and its branches; this is particularly prominent along the proximal superior mesenteric artery and at the origins of both renal arteries, especially on the right. 4.  Scattered diverticulosis along the distal descending sigmoid colon, without evidence of diverticulitis. 5.  Enlarged prostate, measuring 5.4 cm in transverse dimension, impressing on the base of the bladder. 6.  Diffuse atrophy of the paraspinal musculature. 7.  Mild degenerative change along the lower thoracic and lumbar spine.  Original Report Authenticated By: Tonia Ghent, M.D.    Assessment: *Abdominal pain of unclear origin. EGD was negative. There is minor soft tissue minutes in the duodenum by CT scan although pancreatic enzymes are negative. Abdominal pain could be do to NSAID use. I doubt he has an active intra-abdominal process.  Recommendations #1 continue current medications #2 add hyomax**         Timothy Gross D. Arlyce Dice, MD, [facility] [phone]   Timothy Gross  07/29/2011, 9:59 AM

## 2011-07-30 LAB — GLUCOSE, CAPILLARY: Glucose-Capillary: 156 mg/dL — ABNORMAL HIGH (ref 70–99)

## 2011-07-30 MED ORDER — HYOSCYAMINE SULFATE ER 0.375 MG PO TB12: 0.3750 mg | ORAL_TABLET | Freq: Two times a day (BID) | ORAL | Status: DC

## 2011-07-30 MED ORDER — HYDROCODONE-ACETAMINOPHEN 5-325 MG PO TABS: 1.0000 | ORAL_TABLET | Freq: Four times a day (QID) | ORAL | Status: AC | PRN

## 2011-07-30 NOTE — Progress Notes (Signed)
Subjective: Patient still complaining of abdominal pain, which comes on and off.  Tolerating a diet so far.  Objective: Vital signs in last 24 hours: Filed Vitals:   07/29/11 1502 07/29/11 2200 07/30/11 0530 07/30/11 0821  BP: 149/81 144/85 126/79 128/74  Pulse: 68 76 78 76  Temp: 97.9 F (36.6 C) 98 F (36.7 C) 98.1 F (36.7 C) 98.4 F (36.9 C)  TempSrc: Oral Oral Oral Oral  Resp: 18 18 18 19   Height:      Weight:      SpO2: 96% 94% 96% 97%   Weight change: -2.793 kg (-6 lb 2.5 oz)  Intake/Output Summary (Last 24 hours) at 07/30/11 1205 Last data filed at 07/30/11 0904  Gross per 24 hour  Intake    240 ml  Output   1825 ml  Net  -1585 ml    Physical Exam: General: Awake, Oriented, No acute distress. HEENT: EOMI. Neck: Supple CV: S1 and S2 Lungs: Clear to ascultation bilaterally Abdomen: Soft, Nontender, Nondistended, +bowel sounds. Ext: Good pulses. Trace edema.  Lab Results:  [facility] 07/29/11 0455 07/27/11 1530  NA 133* 131*  K 3.6 3.6  CL 95* 92*  CO2 30 28  GLUCOSE 125* 149*  BUN 4* 5*  CREATININE 0.43* 0.47*  CALCIUM 8.8 9.2  MG -- --  PHOS -- --    Basename 07/29/11 0455 07/27/11 1530  AST 27 46*  ALT 46 77*  ALKPHOS 146* 144*  BILITOT 0.7 0.4  PROT 6.7 7.2  ALBUMIN 3.1* 3.4*    Basename 07/29/11 0455 07/27/11 1530  LIPASE 16 19  AMYLASE -- --    Basename 07/29/11 0455 07/27/11 1530  WBC 5.2 5.6  NEUTROABS -- 3.5  HGB 12.5* 13.4  HCT 36.6* 39.1  MCV 82.4 82.1  PLT 231 282    Basename 07/28/11 0805  CKTOTAL --  CKMB --  CKMBINDEX --  TROPONINI <0.30   No components found with this basename: POCBNP:3 No results found for this basename: DDIMER:2 in the last 72 hours  Basename 07/27/11 1530  HGBA1C 9.0*   No results found for this basename: CHOL:2,HDL:2,LDLCALC:2,TRIG:2,CHOLHDL:2,LDLDIRECT:2 in the last 72 hours No results found for this basename: TSH,T4TOTAL,FREET3,T3FREE,THYROIDAB in the last 72 hours No results found for  this basename: VITAMINB12:2,FOLATE:2,FERRITIN:2,TIBC:2,IRON:2,RETICCTPCT:2 in the last 72 hours  Micro Results: No results found for this or any previous visit (from the past 240 hour(s)).  Studies/Results: No results found.  Medications: I have reviewed the patient's current medications. Scheduled Meds:    . benazepril  40 mg Oral BID  . enoxaparin (LOVENOX) injection  60 mg Subcutaneous Q24H  . hydrochlorothiazide  25 mg Oral Daily  . hyoscyamine  0.375 mg Oral Q12H  . insulin aspart  0-9 Units Subcutaneous TID WC  . insulin glargine  25 Units Subcutaneous QHS  . metoprolol  50 mg Oral BID  . mulitivitamin with minerals  1 tablet Oral Daily  . pantoprazole  80 mg Oral BID AC  . simvastatin  40 mg Oral QPM  . DISCONTD: enoxaparin  40 mg Subcutaneous Q24H   Continuous Infusions:  PRN Meds:.acetaminophen, acetaminophen, alum & mag hydroxide-simeth, HYDROcodone-acetaminophen, morphine, ondansetron (ZOFRAN) IV, ondansetron, polyvinyl alcohol  Assessment/Plan: Abdominal pain Patient had EGD on February 21 of 2013 which showed normal EGD.  Appreciate GI input.  May be due to cholelithiasis. The patient will need outpatient surgical evaluation for consideration of cholecystectomy. Continue PPI.   Hypertension Stable continue home medications.   Mild dehydration Resolved.  Likely  due to poor by mouth intake from abdominal pain. Continue gentle IV hydration.   History of iron deficiency anemia Hemoglobin stable at this time. Resolved.   Type 2 diabetes uncontrolled Continue Lantus.  Resume metformin at discharge.  Patient on sliding scale insulin during the course of hospital stay. Hyperlipidemia. Continue statin.   History of GERD Continue PPI.   Hyponatremia Maybe due to pain. Monitor for now. May need to discontinue hydrochlorothiazide if persistent hyponatremia.   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  Also arranged for lift chair.  Prophylaxis. Lovenox.   CODE STATUS. Full code.   LOS: 3 days  Valma Rotenberg A, MD 07/30/2011, 12:05 PM

## 2011-07-30 NOTE — Discharge Summary (Signed)
Discharge Summary  Timothy Gross MR#: [id number]  DOB:09/03/1940  Date of Admission: 07/27/2011 Date of Discharge: 07/30/2011  Patient's PCP: Alva Garnet., MD, MD [facility]: Dr. Dione Plover  Attending Physician:Teren Franckowiak A  Consults: Treatment Team:  Louis Meckel, MD, GI  Discharge Diagnoses: Principal Problem:  *Abdominal pain Active Problems:  Hypertension  Iron deficiency anemia  DM (diabetes mellitus), type 2, uncontrolled  Hyperlipidemia  GERD (gastroesophageal reflux disease)  Hyponatremia  Elevated LFTs  Generalized weakness  History of post-polio syndrome  Dehydration  Brief Admitting History and Physical 71 year old [place] American male with history of hypertension, GERD, hyperlipidemia, type 2 diabetes, history of polio with polio syndrome he presented on 07/28/2011 with abdominal pain and loose bowel movements.  Discharge Medications Medication List  As of 07/30/2011 12:15 PM   TAKE these medications         benazepril 40 MG tablet   Commonly known as: LOTENSIN   Take 40 mg by mouth 2 (two) times daily.      dextran 70-hypromellose ophthalmic solution   Place 1 drop into both eyes 3 (three) times daily as needed. For dry eyes      hydrochlorothiazide 25 MG tablet   Commonly known as: HYDRODIURIL   Take 25 mg by mouth daily.      HYDROcodone-acetaminophen 5-325 MG per tablet   Commonly known as: NORCO   Take 1 tablet by mouth every 6 (six) hours as needed for pain.      hyoscyamine 0.375 MG 12 hr tablet   Commonly known as: LEVBID   Take 1 tablet (0.375 mg total) by mouth every 12 (twelve) hours.      insulin glargine 100 UNIT/ML injection   Commonly known as: LANTUS   Inject 25 Units into the skin at bedtime.      metFORMIN 500 MG tablet   Commonly known as: GLUCOPHAGE   Take 1,000 mg by mouth 2 (two) times daily with a meal.      metoprolol 100 MG tablet   Commonly known as: LOPRESSOR   Take 50 mg by mouth 2 (two)  times daily.      mulitivitamin with minerals Tabs   Take 1 tablet by mouth daily.      omeprazole 40 MG capsule   Commonly known as: PRILOSEC   Take 40 mg by mouth 2 (two) times daily.      simvastatin 40 MG tablet   Commonly known as: ZOCOR   Take 40 mg by mouth every evening.            Hospital Course: Abdominal pain Patient had EGD on February 21 of 2013 which showed normal EGD.  Patient had CT with results as indicated below.  Patient was started on hyoscyamine by GI for abdominal pain. May be due to cholelithiasis. The patient will need outpatient surgical evaluation for consideration of cholecystectomy. Continue PPI.  As needed pain medications given, 20 tablets.  Patient initially complained of diarrhea, however patient has not had any bowel movements during the course of hospital stay.  Initially C. difficile was ordered however given no diarrhea was discontinued at discharge.  Hypertension Stable continue home medications.   Mild dehydration Resolved.  Likely due to poor by mouth intake from abdominal pain. Continue gentle IV hydration.   History of iron deficiency anemia Hemoglobin stable at this time. Resolved.   Type 2 diabetes uncontrolled Continue Lantus.  Resume metformin at discharge.  Patient on sliding scale insulin during the course of  hospital stay. Hyperlipidemia. Continue statin.   History of GERD Continue PPI.   Hyponatremia Maybe due to pain. Monitor for now. May need to discontinue hydrochlorothiazide if persistent hyponatremia.  Will defer to patient's primary care physician for followup.  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  Also arranged for lift chair.  Day of Discharge BP 128/74  Pulse 76  Temp(Src) 98.4 F (36.9 C) (Oral)  Resp 19  Ht 5\' 10"  (1.778 m)  Wt 118.407 kg (261 lb 0.6 oz)  BMI 37.46 kg/m2  SpO2 97%  Results for orders placed  during the hospital encounter of 07/27/11 (from the past 48 hour(s))  GLUCOSE, CAPILLARY     Status: Abnormal   Collection Time   07/28/11  5:49 PM      Component Value Range Comment   Glucose-Capillary 137 (*) 70 - 99 (mg/dL)   GLUCOSE, CAPILLARY     Status: Abnormal   Collection Time   07/28/11  9:53 PM      Component Value Range Comment   Glucose-Capillary 202 (*) 70 - 99 (mg/dL)   LIPASE, BLOOD     Status: Normal   Collection Time   07/29/11  4:55 AM      Component Value Range Comment   Lipase 16  11 - 59 (U/L)   COMPREHENSIVE METABOLIC PANEL     Status: Abnormal   Collection Time   07/29/11  4:55 AM      Component Value Range Comment   Sodium 133 (*) 135 - 145 (mEq/L)    Potassium 3.6  3.5 - 5.1 (mEq/L)    Chloride 95 (*) 96 - 112 (mEq/L)    CO2 30  19 - 32 (mEq/L)    Glucose, Bld 125 (*) 70 - 99 (mg/dL)    BUN 4 (*) 6 - 23 (mg/dL)    Creatinine, Ser 1.61 (*) 0.50 - 1.35 (mg/dL)    Calcium 8.8  8.4 - 10.5 (mg/dL)    Total Protein 6.7  6.0 - 8.3 (g/dL)    Albumin 3.1 (*) 3.5 - 5.2 (g/dL)    AST 27  0 - 37 (U/L) SLIGHT HEMOLYSIS   ALT 46  0 - 53 (U/L)    Alkaline Phosphatase 146 (*) 39 - 117 (U/L)    Total Bilirubin 0.7  0.3 - 1.2 (mg/dL)    GFR calc non Af Amer >90  >90 (mL/min)    GFR calc Af Amer >90  >90 (mL/min)   CBC     Status: Abnormal   Collection Time   07/29/11  4:55 AM      Component Value Range Comment   WBC 5.2  4.0 - 10.5 (K/uL)    RBC 4.44  4.22 - 5.81 (MIL/uL)    Hemoglobin 12.5 (*) 13.0 - 17.0 (g/dL)    HCT 09.6 (*) 04.5 - 52.0 (%)    MCV 82.4  78.0 - 100.0 (fL)    MCH 28.2  26.0 - 34.0 (pg)    MCHC 34.2  30.0 - 36.0 (g/dL)    RDW 40.9  81.1 - 91.4 (%)    Platelets 231  150 - 400 (K/uL)   GLUCOSE, CAPILLARY     Status: Normal   Collection Time   07/29/11  7:34 AM      Component Value Range Comment   Glucose-Capillary 91  70 - 99 (mg/dL)    Comment 1 Notify RN  GLUCOSE, CAPILLARY     Status: Abnormal   Collection Time   07/29/11 11:48 AM       Component Value Range Comment   Glucose-Capillary 147 (*) 70 - 99 (mg/dL)    Comment 1 Notify RN     GLUCOSE, CAPILLARY     Status: Abnormal   Collection Time   07/29/11  4:46 PM      Component Value Range Comment   Glucose-Capillary 169 (*) 70 - 99 (mg/dL)    Comment 1 Notify RN     GLUCOSE, CAPILLARY     Status: Abnormal   Collection Time   07/29/11  9:30 PM      Component Value Range Comment   Glucose-Capillary 313 (*) 70 - 99 (mg/dL)   GLUCOSE, CAPILLARY     Status: Abnormal   Collection Time   07/30/11  7:47 AM      Component Value Range Comment   Glucose-Capillary 156 (*) 70 - 99 (mg/dL)    Comment 1 Notify RN     GLUCOSE, CAPILLARY     Status: Abnormal   Collection Time   07/30/11 11:56 AM      Component Value Range Comment   Glucose-Capillary 169 (*) 70 - 99 (mg/dL)    Comment 1 Notify RN       US Abdomen Complete  07/28/2011  *RADIOLOGY REPORT*  Clinical Data:  Diabetes.  Hypertensive.  Abdominal pain.  COMPLETE ABDOMINAL ULTRASOUND  Comparison:  07/27/2011 CT.  Findings:  Gallbladder:  1.5 cm gallstone gallbladder neck level.  Gallbladder wall measures up to 2.7 mm.  No pericholecystic fluid.  The patient was not tender over this region during scanning.  Common bile duct:  7.3 mm.  Liver:  Dense with slightly lobulated contour raising possibility of cirrhosis.  IVC:  Appears normal.  Pancreas:  Not visualized secondary to bowel gas.  Spleen:  7.0 cm without focal mass.  Right Kidney:  10.7 cm. No hydronephrosis or renal mass.  Left Kidney:  10.9 cm. No hydronephrosis or renal mass.  Abdominal aorta:  Not well visualized secondary to bowel gas.  IMPRESSION: 1.5 cm gallstone gallbladder neck level without definitive findings of cholecystitis.  Question cirrhosis.  Limited evaluation of the pancreas and abdominal aorta secondary to bowel gas.  Original Report Authenticated By: Fuller Canada, M.D.   Ct Abdomen Pelvis W Contrast  07/28/2011  *RADIOLOGY REPORT*  Clinical Data:  Abdominal pain and diarrhea.  History of sickle cell disease.  CT ABDOMEN AND PELVIS WITH CONTRAST  Technique:  Multidetector CT imaging of the abdomen and pelvis was performed following the standard protocol during bolus administration of intravenous contrast.  Contrast: OMNIPAQUE IOHEXOL 300 MG/ML IV SOLN  Comparison: CT of the abdomen and pelvis performed 11/22/2010  Findings: Minimal bibasilar atelectasis is noted.  The liver and spleen are unremarkable in appearance.  A small stone is noted at the base of the gallbladder; the gallbladder is otherwise unremarkable in appearance.  The adrenal glands are unremarkable.  Note is made of mild soft tissue inflammation about the second segment of the duodenum and adjacent pancreatic head; this could conceivably reflect very mild pancreatitis.  There is no evidence for pseudocyst formation or devascularization at this time.  Nonspecific perinephric stranding is noted bilaterally.  The kidneys are otherwise unremarkable in appearance.  There is no evidence of hydronephrosis.  No renal or ureteral stones are seen.  No free fluid is identified.  An apparent contrast-filled diverticulum is noted along  the proximal ileum at the left mid abdomen; the remainder of the small bowel is unremarkable in appearance.  The stomach is within normal limits.  No acute vascular abnormalities are seen. Scattered calcification is noted along the abdominal aorta and its branches.  This is particularly prominent along the proximal superior mesenteric artery and at the origins of both renal arteries, especially on the right.  The appendix is normal in caliber and contains air, without evidence for appendicitis.  Scattered diverticulosis is noted along the distal descending and sigmoid colon, without evidence of diverticulitis.  The colon is otherwise unremarkable in appearance.  The bladder is mildly distended and grossly unremarkable in appearance.  The prostate is enlarged, measuring  5.4 cm in transverse dimension; the prostate impresses on the base of the bladder, as on the prior study.  No inguinal lymphadenopathy is seen.  There is diffuse atrophy of the paraspinal musculature.  No acute osseous abnormalities are identified.  Vacuum phenomenon is noted at T10-T11 and at L4-L5, and there is disc space narrowing at L5-S1  IMPRESSION:  1.  Mild soft tissue inflammation noted about the second segment of the duodenum and adjacent pancreatic head; this could conceivably reflect very mild pancreatitis.  Suggest correlation with lab values.  No evidence for pseudocyst formation or devascularization at this time. 2.  Cholelithiasis; gallbladder otherwise unremarkable in appearance. 3.  Scattered calcification along the abdominal aorta and its branches; this is particularly prominent along the proximal superior mesenteric artery and at the origins of both renal arteries, especially on the right. 4.  Scattered diverticulosis along the distal descending sigmoid colon, without evidence of diverticulitis. 5.  Enlarged prostate, measuring 5.4 cm in transverse dimension, impressing on the base of the bladder. 6.  Diffuse atrophy of the paraspinal musculature. 7.  Mild degenerative change along the lower thoracic and lumbar spine.  Original Report Authenticated By: Tonia Ghent, M.D.     Disposition: Home with home health PT/OT  Diet: Diabetic diet  Activity: Resume as tolerated   Follow-up Appts: Discharge Orders    Future Orders Please Complete By Expires   Diet Carb Modified      Increase activity slowly      Discharge instructions      Comments:   Followup with Alva Garnet., MD (PCP) in 1 week, please discuss with her primary care physician for consideration of referral to surgery for cholecystectomy given gallstones. Followup with Dr. Elnoria Howard (GI) in 1-2 weeks if still having abdominal pain.      TESTS THAT NEED FOLLOW-UP None, patient to followup with primary care physician  for consideration of surgical referral for cholelithiasis.  Outpatient followup for hyponatremia.  Time spent on discharge, talking to the patient, and coordinating care: 35 mins.  Signed: Cristal Ford, MD 07/30/2011, 12:15 PM

## 2011-07-30 NOTE — Progress Notes (Signed)
Cm spoke with pt concerning d/c planning. Pt confirmed [facility] to provide [facility] services. Pt is required to go to on-site [facility] store to pick out lift chair. CM to inform pt that lift chair not covered by insurance. [facility] needs pt to choose a lift chair before paperwork can be referred to insurance company for approval. Pt states he will not go to [facility] store to choose lift chair if [employer] does not approve chair first. [facility] rep to contact pt Monday concerning steps needing to be taken concerning lift chair.   Leonie Green [phone]

## 2011-07-30 NOTE — Progress Notes (Signed)
Patient ID: Timothy Gross, male   DOB: 11/08/1940, 71 y.o.   MRN: [id number]  Follow up for  GI  Subjective: Pain comes and goes.  Not certain if hyomax helps.  Objective: Vital signs in last 24 hours: Temp:  [97.9 F (36.6 C)-98.4 F (36.9 C)] 98.4 F (36.9 C) (02/23 0821) Pulse Rate:  [68-78] 76  (02/23 0821) Resp:  [18-19] 19  (02/23 0821) BP: (126-149)/(74-85) 128/74 mmHg (02/23 0821) SpO2:  [94 %-97 %] 97 % (02/23 0821) Last BM Date: 07/27/11  Intake/Output from previous day: 02/22 0701 - 02/23 0700 In: 480 [P.O.:480] Out: 1900 [Urine:1900] Intake/Output this shift: Total I/O In: -  Out: 125 [Urine:125]  General appearance: alert and no distress GI: mild epigastric tenderness  Lab Results:  Basename 07/29/11 0455 07/27/11 1530  WBC 5.2 5.6  HGB 12.5* 13.4  HCT 36.6* 39.1  PLT 231 282   BMET  Basename 07/29/11 0455 07/27/11 1530  NA 133* 131*  K 3.6 3.6  CL 95* 92*  CO2 30 28  GLUCOSE 125* 149*  BUN 4* 5*  CREATININE 0.43* 0.47*  CALCIUM 8.8 9.2   LFT  Basename 07/29/11 0455 07/27/11 1530  PROT 6.7 --  ALBUMIN 3.1* --  AST 27 --  ALT 46 --  ALKPHOS 146* --  BILITOT 0.7 --  BILIDIR -- 0.1  IBILI -- 0.3   PT/INR No results found for this basename: LABPROT:2,INR:2 in the last 72 hours Hepatitis Panel No results found for this basename: HEPBSAG,HCVAB,HEPAIGM,HEPBIGM in the last 72 hours C-Diff No results found for this basename: CDIFFTOX:3 in the last 72 hours Fecal Lactopherrin No results found for this basename: FECLLACTOFRN in the last 72 hours  Studies/Results: No results found.  Medications:  Scheduled:   . benazepril  40 mg Oral BID  . enoxaparin (LOVENOX) injection  60 mg Subcutaneous Q24H  . hydrochlorothiazide  25 mg Oral Daily  . hyoscyamine  0.375 mg Oral Q12H  . insulin aspart  0-9 Units Subcutaneous TID WC  . insulin glargine  25 Units Subcutaneous QHS  . metoprolol  50 mg Oral BID  . mulitivitamin with minerals   1 tablet Oral Daily  . pantoprazole  80 mg Oral BID AC  . simvastatin  40 mg Oral QPM  . DISCONTD: enoxaparin  40 mg Subcutaneous Q24H   Continuous:   Assessment/Plan: 1) Chronic ABM pain.  Plan: 1) Continue with hyomax. 2) Signing off.  LOS: 3 days   Daris Aristizabal D 07/30/2011, 9:07 AM

## 2011-08-19 ENCOUNTER — Emergency Department (HOSPITAL_COMMUNITY)
Admission: EM | Admit: 2011-08-19 | Discharge: 2011-08-20 | Disposition: A | Discharge status: Home Health | Payer: Medicare Other | Attending: Emergency Medicine | Admitting: Emergency Medicine

## 2011-08-19 DIAGNOSIS — K625 Hemorrhage of anus and rectum: Secondary | ICD-10-CM | POA: Insufficient documentation

## 2011-08-19 DIAGNOSIS — R197 Diarrhea, unspecified: Secondary | ICD-10-CM | POA: Insufficient documentation

## 2011-08-19 DIAGNOSIS — K5730 Diverticulosis of large intestine without perforation or abscess without bleeding: Secondary | ICD-10-CM | POA: Insufficient documentation

## 2011-08-19 DIAGNOSIS — E785 Hyperlipidemia, unspecified: Secondary | ICD-10-CM | POA: Insufficient documentation

## 2011-08-19 DIAGNOSIS — Z79899 Other long term (current) drug therapy: Secondary | ICD-10-CM | POA: Insufficient documentation

## 2011-08-19 DIAGNOSIS — K219 Gastro-esophageal reflux disease without esophagitis: Secondary | ICD-10-CM | POA: Insufficient documentation

## 2011-08-19 DIAGNOSIS — K921 Melena: Secondary | ICD-10-CM | POA: Insufficient documentation

## 2011-08-19 DIAGNOSIS — I10 Essential (primary) hypertension: Secondary | ICD-10-CM | POA: Insufficient documentation

## 2011-08-19 DIAGNOSIS — E119 Type 2 diabetes mellitus without complications: Secondary | ICD-10-CM | POA: Insufficient documentation

## 2011-08-19 DIAGNOSIS — K5790 Diverticulosis of intestine, part unspecified, without perforation or abscess without bleeding: Secondary | ICD-10-CM

## 2011-08-19 LAB — DIFFERENTIAL
Basophils Absolute: 0 10*3/uL (ref 0.0–0.1)
Basophils Relative: 0 % (ref 0–1)
Eosinophils Absolute: 0.2 10*3/uL (ref 0.0–0.7)
Eosinophils Relative: 3 % (ref 0–5)
Monocytes Absolute: 0.5 10*3/uL (ref 0.1–1.0)
Monocytes Relative: 8 % (ref 3–12)

## 2011-08-19 LAB — CBC
HCT: 40.2 % (ref 39.0–52.0)
Hemoglobin: 13.5 g/dL (ref 13.0–17.0)
MCH: 27.7 pg (ref 26.0–34.0)
MCHC: 33.6 g/dL (ref 30.0–36.0)
RDW: 13.2 % (ref 11.5–15.5)

## 2011-08-19 NOTE — ED Notes (Signed)
Pt assisted X 2 via wheelchair from bedside commode in bathroom to bed in Room 11.

## 2011-08-19 NOTE — ED Notes (Signed)
Per EMS: pt called ems with c/o blood in stool. ems reports very minimal amount. Pt reports having a bowel movement, saw blood on toilet paper. Pt denies any dizziness, chest pain, shortness of breath, weakness. No hx of hemorrhoids. Pt denies pain.

## 2011-08-19 NOTE — ED Notes (Signed)
Pt brought toilet paper with him containing bright red blood from cleaning himself

## 2011-08-20 LAB — BASIC METABOLIC PANEL
BUN: 8 mg/dL (ref 6–23)
Calcium: 9.4 mg/dL (ref 8.4–10.5)
Creatinine, Ser: 0.5 mg/dL (ref 0.50–1.35)
GFR calc Af Amer: >90 mL/min (ref >90)
GFR calc non Af Amer: >90 mL/min (ref >90)

## 2011-08-20 NOTE — Discharge Instructions (Signed)
You were seen and evaluated today for your complaints of blood from your rectum. At this time your lab tests do not show any concerning signs. Your providers today feel you are safe to return home and followup with your primary care provider and GI specialist. If you develop recurrent episodes of bleeding, lightheadedness, shortness of breath, chest pain or discomfort, increased heart rate, nausea please return to the emergency room.  Bloody Stools Bloody stools often mean that there is a problem in the digestive tract. Your caregiver may use the term "melena" to describe black, tarry, and bad smelling stools or "hematochezia" to describe red or maroon-colored stools. Blood seen in the stool can be caused by bleeding anywhere along the intestinal tract.  A black stool usually means that blood is coming from the upper part of the gastrointestinal tract (esophagus, stomach, or small bowel). Passing maroon-colored stools or bright red blood usually means that blood is coming from lower down in the large bowel or the rectum. However, sometimes massive bleeding in the stomach or small intestine can cause bright red bloody stools.  Consuming black licorice, lead, iron pills, medicines containing bismuth subsalicylate, or blueberries can also cause black stools. Your caregiver can test black stools to see if blood is present. It is important that the cause of the bleeding be found. Treatment can then be started, and the problem can be corrected. Rectal bleeding may not be serious, but you should not assume everything is okay until you know the cause.It is very important to follow up with your caregiver or a specialist in gastrointestinal problems. CAUSES  Blood in the stools can come from various underlying causes.Often, the cause is not found during your first visit. Testing is often needed to discover the cause of bleeding in the gastrointestinal tract. Causes range from simple to serious or even  life-threatening.Possible causes include:  Hemorrhoids.These are veins that are full of blood (engorged) in the rectum. They cause pain, inflammation, and may bleed.   Anal fissures.These are areas of painful tearing which may bleed. They are often caused by passing hard stool.   Diverticulosis.These are pouches that form on the colon over time, with age, and may bleed significantly.   Diverticulitis.This is inflammation in areas with diverticulosis. It can cause pain, fever, and bloody stools, although bleeding is rare.   Proctitis and colitis. These are inflamed areas of the rectum or colon. They may cause pain, fever, and bloody stools.   Polyps and cancer. Colon cancer is a leading cause of preventable cancer death.It often starts out as precancerous polyps that can be removed during a colonoscopy, preventing progression into cancer. Sometimes, polyps and cancer may cause rectal bleeding.   Gastritis and ulcers.Bleeding from the upper gastrointestinal tract (near the stomach) may travel through the intestines and produce black, sometimes tarry, often bad smelling stools. In certain cases, if the bleeding is fast enough, the stools may not be black, but red and the condition may be life-threatening.  SYMPTOMS  You may have stools that are bright red and bloody, that are normal color with blood on them, or that are dark black and tarry. In some cases, you may only have blood in the toilet bowl. Any of these cases need medical care. You may also have:  Pain at the anus or anywhere in the rectum.   Lightheadedness or feeling faint.   Extreme weakness.   Nausea or vomiting.   Fever.  DIAGNOSIS Your caregiver may use the following methods to  find the cause of your bleeding:  Taking a medical history. Age is important. Older people tend to develop polyps and cancer more often. If there is anal pain and a hard, large stool associated with bleeding, a tear of the anus may be the  cause. If blood drips into the toilet after a bowel movement, bleeding hemorrhoids may be the problem. The color and frequency of the bleeding are additional considerations. In most cases, the medical history provides clues, but seldom the final answer.   A visual and finger (digital) exam. Your caregiver will inspect the anal area, looking for tears and hemorrhoids. A finger exam can provide information when there is tenderness or a growth inside. In men, the prostate is also examined.   Endoscopy. Several types of small, long scopes (endoscopes) are used to view the colon.   In the office, your caregiver may use a rigid, or more commonly, a flexible viewing sigmoidoscope. This exam is called flexible sigmoidoscopy. It is performed in 5 to 10 minutes.   A more thorough exam is accomplished with a colonoscope. It allows your caregiver to view the entire 5 to 6 foot long colon. Medicine to help you relax (sedative) is usually given for this exam. Frequently, a bleeding lesion may be present beyond the reach of the sigmoidoscope. So, a colonoscopy may be the best exam to start with. Both exams are usually done on an outpatient basis. This means the patient does not stay overnight in the hospital or surgery center.   An upper endoscopy may be needed to examine your stomach. Sedation is used and a flexible endoscope is put in your mouth, down to your stomach.   A barium enema X-ray. This is an X-ray exam. It uses liquid barium inserted by enema into the rectum. This test alone may not identify an actual bleeding point. X-rays highlight abnormal shadows, such as those made by lumps (tumors), diverticuli, or colitis.  TREATMENT  Treatment depends on the cause of your bleeding.   For bleeding from the stomach or colon, the caregiver doing your endoscopy or colonoscopy may be able to stop the bleeding as part of the procedure.   Inflammation or infection of the colon can be treated with medicines.   Many  rectal problems can be treated with creams, suppositories, or warm baths.   Surgery is sometimes needed.   Blood transfusions are sometimes needed if you have lost a lot of blood.   For any bleeding problem, let your caregiver know if you take aspirin or other blood thinners regularly.  HOME CARE INSTRUCTIONS   Take any medicines exactly as prescribed.   Keep your stools soft by eating a diet high in fiber. Prunes (1 to 3 a day) work well for many people.   Drink enough water and fluids to keep your urine clear or pale yellow.   Take sitz baths if advised. A sitz bath is when you sit in a bathtub with warm water for 10 to 15 minutes to soak, soothe, and cleanse the rectal area.   If enemas or suppositories are advised, be sure you know how to use them. Tell your caregiver if you have problems with this.   Monitor your bowel movements to look for signs of improvement or worsening.  SEEK MEDICAL CARE IF:   You do not improve in the time expected.   Your condition worsens after initial improvement.   You develop any new symptoms.  SEEK IMMEDIATE MEDICAL CARE IF:  You develop severe or prolonged rectal bleeding.   You vomit blood.   You feel weak or faint.   You have a fever.  MAKE SURE YOU:  Understand these instructions.   Will watch your condition.   Will get help right away if you are not doing well or get worse.  Document Released: 05/13/2002 Document Revised: 05/12/2011 Document Reviewed: 10/08/2010 [facility] Patient Information 2012 [place], [place].    Diverticulosis Diverticulosis is a common condition that develops when small pouches (diverticula) form in the wall of the colon. The risk of diverticulosis increases with age. It happens more often in people who eat a low-fiber diet. Most individuals with diverticulosis have no symptoms. Those individuals with symptoms usually experience abdominal pain, constipation, or loose stools (diarrhea). HOME CARE  INSTRUCTIONS   Increase the amount of fiber in your diet as directed by your caregiver or dietician. This may reduce symptoms of diverticulosis.   Your caregiver may recommend taking a dietary fiber supplement.   Drink at least 6 to 8 glasses of water each day to prevent constipation.   Try not to strain when you have a bowel movement.   Your caregiver may recommend avoiding nuts and seeds to prevent complications, although this is still an uncertain benefit.   Only take over-the-counter or prescription medicines for pain, discomfort, or fever as directed by your caregiver.  FOODS WITH HIGH FIBER CONTENT INCLUDE:  Fruits. Apple, peach, pear, tangerine, raisins, prunes.   Vegetables. Brussels sprouts, asparagus, broccoli, cabbage, carrot, cauliflower, romaine lettuce, spinach, summer squash, tomato, winter squash, zucchini.   Starchy Vegetables. Baked beans, kidney beans, lima beans, split peas, lentils, potatoes (with skin).   Grains. Whole wheat bread, brown rice, bran flake cereal, plain oatmeal, white rice, shredded wheat, bran muffins.  SEEK IMMEDIATE MEDICAL CARE IF:   You develop increasing pain or severe bloating.   You have an oral temperature above 102 F (38.9 C), not controlled by medicine.   You develop vomiting or bowel movements that are bloody or black.  Document Released: 02/18/2004 Document Revised: 05/12/2011 Document Reviewed: 10/21/2009 [facility] Patient Information 2012 [place], [place].

## 2011-08-20 NOTE — ED Provider Notes (Signed)
History     CSN: [id number]  Arrival date & time 08/19/11  2109   First MD Initiated Contact with Patient 08/19/11 2244      Chief Complaint  Patient presents with  . Melena     HPI  History provided by the patient. Patient is a 71 year old male with history of hypertension, diabetes, polio and GERD who presents with complaints of bright red blood as well as possible melena in stools today. Patient reports seeing bright red blood on toilet paper along with very dark thick type stool. he reports only one episode like this. Patient reports normal bowel movements are soft and liquid-like diarrhea.  He reports BMs like this for many years.  He denies any aggravating or alleviating factors.  Symptoms are described as mild.  Pt denies associated abdominal pain, lightheadedness, dizziness, SOB, CP, heart palpitations, increased fatigue, fever, chills, or sweats.     Past Medical History  Diagnosis Date  . Diabetes mellitus   . Hypertension   . History of UTI   . Hyperlipidemia   . GERD (gastroesophageal reflux disease)   . History of post-polio syndrome   . H/O acute alcoholic hepatitis   . Cervical myelopathy     Hx of  . Right patella fracture   . Right sided weakness   . Iron deficiency anemia     Past Surgical History  Procedure Date  . Cervical fusion 04/16/2003  . Orif right femur fracture   . Radioactive iodine treatment 1984  . Esophagogastroduodenoscopy 07/28/2011    Procedure: ESOPHAGOGASTRODUODENOSCOPY (EGD);  Surgeon: Louis Meckel, MD;  Location: [facility];  Service: Endoscopy;  Laterality: N/A;    Family History  Problem Relation Age of Onset  . Diabetes Mother   . Diabetes Father   . Diabetes Brother     History  Substance Use Topics  . Smoking status: Former Smoker    Quit date: 07/27/1973  . Smokeless tobacco: Never Used  . Alcohol Use: No     Quit around 2001      Review of Systems  Constitutional: Negative for fever and chills.    Gastrointestinal: Positive for diarrhea and blood in stool. Negative for nausea, vomiting, abdominal pain and constipation.  All other systems reviewed and are negative.    Allergies  Review of patient's allergies indicates no known allergies.  Home Medications   Current Outpatient Rx  Name Route Sig Dispense Refill  . BENAZEPRIL HCL 40 MG PO TABS Oral Take 40 mg by mouth daily.     . TEARS RENEWED OP SOLN Both Eyes Place 1 drop into both eyes 3 (three) times daily as needed. For dry eyes    . HYDROCHLOROTHIAZIDE 25 MG PO TABS Oral Take 25 mg by mouth daily.    . INSULIN GLARGINE 100 UNIT/ML [place] SOLN Subcutaneous Inject 25 Units into the skin at bedtime.    Marland Kitchen PANOBINOSTAT (LBH589) 5 MG CAPS #45 ALLIANCE N093B Oral Take 1 mg by mouth daily. Take with 260ml(8oz) of water. Swallow whole. Do not crush.    . METFORMIN HCL 500 MG PO TABS Oral Take 1,000 mg by mouth 2 (two) times daily with a meal.    . METOPROLOL TARTRATE 50 MG PO TABS Oral Take 25 mg by mouth daily.    . ADULT MULTIVITAMIN W/MINERALS CH Oral Take 1 tablet by mouth daily.    Marland Kitchen OMEPRAZOLE 40 MG PO CPDR Oral Take 40 mg by mouth 2 (two) times daily.    Marland Kitchen  SIMVASTATIN 40 MG PO TABS Oral Take 40 mg by mouth every evening.      BP 103/64  Pulse 86  Temp(Src) 98.7 F (37.1 C) (Oral)  Resp 20  SpO2 96%  Physical Exam  Nursing note and vitals reviewed. Constitutional: He is oriented to person, place, and time. He appears well-developed and well-nourished. No distress.  HENT:  Head: Normocephalic.  Cardiovascular: Normal rate and regular rhythm.   Pulmonary/Chest: Effort normal and breath sounds normal. No respiratory distress. He has no wheezes. He has no rales.  Abdominal: Soft. He exhibits no distension. There is no tenderness. There is no rebound and no guarding.  Genitourinary: Rectum normal.       No gross blood. No fissures, tenderness on exam or external hemorrhoids.  Neurological: He is alert and oriented to person,  place, and time.       Legs with baseline weakness unchanged.  Skin: Skin is warm. No rash noted.  Psychiatric: He has a normal mood and affect. His behavior is normal.    ED Course  Procedures   Results for orders placed during the hospital encounter of 08/19/11  CBC      Component Value Range   WBC 6.3  4.0 - 10.5 (K/uL)   RBC 4.88  4.22 - 5.81 (MIL/uL)   Hemoglobin 13.5  13.0 - 17.0 (g/dL)   HCT 82.9  56.2 - 13.0 (%)   MCV 82.4  78.0 - 100.0 (fL)   MCH 27.7  26.0 - 34.0 (pg)   MCHC 33.6  30.0 - 36.0 (g/dL)   RDW 86.5  78.4 - 69.6 (%)   Platelets 350  150 - 400 (K/uL)  DIFFERENTIAL      Component Value Range   Neutrophils Relative 56  43 - 77 (%)   Neutro Abs 3.6  1.7 - 7.7 (K/uL)   Lymphocytes Relative 33  12 - 46 (%)   Lymphs Abs 2.1  0.7 - 4.0 (K/uL)   Monocytes Relative 8  3 - 12 (%)   Monocytes Absolute 0.5  0.1 - 1.0 (K/uL)   Eosinophils Relative 3  0 - 5 (%)   Eosinophils Absolute 0.2  0.0 - 0.7 (K/uL)   Basophils Relative 0  0 - 1 (%)   Basophils Absolute 0.0  0.0 - 0.1 (K/uL)  BASIC METABOLIC PANEL      Component Value Range   Sodium 132 (*) 135 - 145 (mEq/L)   Potassium 4.0  3.5 - 5.1 (mEq/L)   Chloride 92 (*) 96 - 112 (mEq/L)   CO2 32  19 - 32 (mEq/L)   Glucose, Bld 249 (*) 70 - 99 (mg/dL)   BUN 8  6 - 23 (mg/dL)   Creatinine, Ser 2.95  0.50 - 1.35 (mg/dL)   Calcium 9.4  8.4 - 28.4 (mg/dL)   GFR calc non Af Amer >90  >90 (mL/min)   GFR calc Af Amer >90  >90 (mL/min)  OCCULT BLOOD, POC DEVICE      Component Value Range   Fecal Occult Bld POSITIVE          1. Rectal bleeding   2. Diverticulosis       MDM  Patient seen and evaluated. Patient in no acute distress. Patient with no complaints of pain. Patient denies lightheadedness, shortness of breath or increased fatigue.  Pt seen and discussed with attending physician.  H/H are normal.  Pt without any additional concerning symptoms of abd pain, lightheadedness, SOB, CP, or  tachycardia.  Angus Seller, [place] 08/21/11 1936

## 2011-08-20 NOTE — ED Notes (Signed)
Pt transported home via [facility] 

## 2011-08-20 NOTE — ED Notes (Signed)
[facility] notified for pt transport home.

## 2011-08-26 NOTE — ED Provider Notes (Signed)
Medical screening examination/treatment/procedure(s) were performed by non-physician practitioner and as supervising physician I was immediately available for consultation/collaboration.  Jamil Castillo, MD 08/26/11 1928 

## 2011-09-04 ENCOUNTER — Emergency Department (HOSPITAL_COMMUNITY)
Admission: EM | Admit: 2011-09-04 | Discharge: 2011-09-05 | Disposition: A | Discharge status: Home / Self Care | Payer: Medicare Other | Attending: Emergency Medicine | Admitting: Emergency Medicine

## 2011-09-04 ENCOUNTER — Encounter (HOSPITAL_COMMUNITY): Payer: Self-pay | Admitting: Emergency Medicine

## 2011-09-04 DIAGNOSIS — Z79899 Other long term (current) drug therapy: Secondary | ICD-10-CM | POA: Insufficient documentation

## 2011-09-04 DIAGNOSIS — R112 Nausea with vomiting, unspecified: Secondary | ICD-10-CM | POA: Insufficient documentation

## 2011-09-04 DIAGNOSIS — R079 Chest pain, unspecified: Secondary | ICD-10-CM | POA: Insufficient documentation

## 2011-09-04 DIAGNOSIS — R609 Edema, unspecified: Secondary | ICD-10-CM | POA: Insufficient documentation

## 2011-09-04 DIAGNOSIS — E785 Hyperlipidemia, unspecified: Secondary | ICD-10-CM | POA: Insufficient documentation

## 2011-09-04 DIAGNOSIS — E119 Type 2 diabetes mellitus without complications: Secondary | ICD-10-CM | POA: Insufficient documentation

## 2011-09-04 DIAGNOSIS — R197 Diarrhea, unspecified: Secondary | ICD-10-CM | POA: Insufficient documentation

## 2011-09-04 DIAGNOSIS — I10 Essential (primary) hypertension: Secondary | ICD-10-CM | POA: Insufficient documentation

## 2011-09-04 DIAGNOSIS — K219 Gastro-esophageal reflux disease without esophagitis: Secondary | ICD-10-CM | POA: Insufficient documentation

## 2011-09-04 DIAGNOSIS — Z794 Long term (current) use of insulin: Secondary | ICD-10-CM | POA: Insufficient documentation

## 2011-09-04 DIAGNOSIS — R1012 Left upper quadrant pain: Secondary | ICD-10-CM | POA: Insufficient documentation

## 2011-09-04 DIAGNOSIS — K8020 Calculus of gallbladder without cholecystitis without obstruction: Secondary | ICD-10-CM | POA: Insufficient documentation

## 2011-09-04 NOTE — ED Notes (Signed)
Pt c/o L sided abd pain x several months pt seen here x 2 for same, has not followed up with PCP, states he has appt Tuesday. Pt sates pain worse now, emesis x 1 last night. Denies diarrhea.

## 2011-09-04 NOTE — ED Notes (Signed)
ZOX:WRUE<AV> Expected date:<BR> Expected time:10:56 PM<BR> Means of arrival:Ambulance<BR> Comments:<BR> M261 -- Abdominal Pain

## 2011-09-04 NOTE — ED Notes (Signed)
Pt c/o low abd pain x 4 hours, emesis last evening. Denies diarrhea

## 2011-09-05 ENCOUNTER — Emergency Department (HOSPITAL_COMMUNITY): Payer: Medicare Other

## 2011-09-05 LAB — CBC
HCT: 39.7 % (ref 39.0–52.0)
Hemoglobin: 13.5 g/dL (ref 13.0–17.0)
MCH: 28.4 pg (ref 26.0–34.0)
MCHC: 34 g/dL (ref 30.0–36.0)
MCV: 83.6 fL (ref 78.0–100.0)
Platelets: 285 10*3/uL (ref 150–400)
RBC: 4.75 MIL/uL (ref 4.22–5.81)
RDW: 13.4 % (ref 11.5–15.5)
WBC: 6.3 10*3/uL (ref 4.0–10.5)

## 2011-09-05 LAB — COMPREHENSIVE METABOLIC PANEL
Albumin: 3.6 g/dL (ref 3.5–5.2)
BUN: 5 mg/dL — ABNORMAL LOW (ref 6–23)
Calcium: 9.7 mg/dL (ref 8.4–10.5)
Chloride: 89 mEq/L — ABNORMAL LOW (ref 96–112)
Creatinine, Ser: 0.47 mg/dL — ABNORMAL LOW (ref 0.50–1.35)
Total Bilirubin: 0.6 mg/dL (ref 0.3–1.2)
Total Protein: 7.8 g/dL (ref 6.0–8.3)

## 2011-09-05 LAB — DIFFERENTIAL
Basophils Absolute: 0 10*3/uL (ref 0.0–0.1)
Basophils Relative: 0 % (ref 0–1)
Eosinophils Absolute: 0.1 10*3/uL (ref 0.0–0.7)
Eosinophils Relative: 1 % (ref 0–5)
Lymphocytes Relative: 28 % (ref 12–46)
Lymphs Abs: 1.8 10*3/uL (ref 0.7–4.0)
Monocytes Absolute: 0.7 10*3/uL (ref 0.1–1.0)
Monocytes Relative: 12 % (ref 3–12)
Neutro Abs: 3.7 10*3/uL (ref 1.7–7.7)
Neutrophils Relative %: 59 % (ref 43–77)

## 2011-09-05 LAB — URINALYSIS, ROUTINE W REFLEX MICROSCOPIC
Ketones, ur: 15 mg/dL — AB
Leukocytes, UA: NEGATIVE
Nitrite: NEGATIVE
Protein, ur: NEGATIVE mg/dL
pH: 7 (ref 5.0–8.0)

## 2011-09-05 LAB — LIPASE, BLOOD: Lipase: 26 U/L (ref 11–59)

## 2011-09-05 MED ORDER — ONDANSETRON HCL 4 MG/2ML IJ SOLN
4.0000 mg | Freq: Once | INTRAMUSCULAR | Status: AC
  Administered 2011-09-05: 4 mg via INTRAVENOUS
  Filled 2011-09-05: qty 2

## 2011-09-05 MED ORDER — METOCLOPRAMIDE HCL 10 MG PO TABS: 10.0000 mg | ORAL_TABLET | Freq: Four times a day (QID) | ORAL | Status: DC | PRN

## 2011-09-05 MED ORDER — OXYCODONE-ACETAMINOPHEN 5-325 MG PO TABS: 1.0000 | ORAL_TABLET | ORAL | Status: DC | PRN

## 2011-09-05 MED ORDER — HYDROMORPHONE HCL PF 1 MG/ML IJ SOLN
1.0000 mg | Freq: Once | INTRAMUSCULAR | Status: AC
  Administered 2011-09-05: 1 mg via INTRAVENOUS
  Filled 2011-09-05: qty 1

## 2011-09-05 MED ORDER — SODIUM CHLORIDE 0.9 % IV SOLN
Freq: Once | INTRAVENOUS | Status: AC
  Administered 2011-09-05: 02:00:00 via INTRAVENOUS

## 2011-09-05 MED ORDER — IOHEXOL 300 MG/ML  SOLN
100.0000 mL | Freq: Once | INTRAMUSCULAR | Status: AC | PRN
  Administered 2011-09-05: 100 mL via INTRAVENOUS

## 2011-09-05 NOTE — ED Provider Notes (Signed)
History     CSN: [id number]  Arrival date & time 09/04/11  2304   First MD Initiated Contact with Patient 09/05/11 0036      Chief Complaint  Patient presents with  . Abdominal Pain    (Consider location/radiation/quality/duration/timing/severity/associated sxs/prior treatment) Patient is a 71 y.o. male presenting with abdominal pain. The history is provided by the patient.  Abdominal Pain The primary symptoms of the illness include abdominal pain.  He has been having abdominal pain for the last week which has been getting worse. The pain is primarily in the left upper abdomen with some radiation to the chest and occasionally to the left flank. Pain is both sharp and burning. It is worse after eating and better if he doesn't eat. Yesterday, he had some nausea and vomiting. Pain has become severe and he rates it at 10/10. He denies fever, chills, sweats. He states he has been having chronic diarrhea for the last year and ovaries been able to find out why. He has had problems with abdominal pain previously and states that he has been told that he has gallstones. He denies fever, chills, sweats. He is on medication for his stomach but has not taken anything specifically for this pain.  Past Medical History  Diagnosis Date  . Diabetes mellitus   . Hypertension   . History of UTI   . Hyperlipidemia   . GERD (gastroesophageal reflux disease)   . History of post-polio syndrome   . H/O acute alcoholic hepatitis   . Cervical myelopathy     Hx of  . Right patella fracture   . Right sided weakness   . Iron deficiency anemia     Past Surgical History  Procedure Date  . Cervical fusion 04/16/2003  . Orif right femur fracture   . Radioactive iodine treatment 1984  . Esophagogastroduodenoscopy 07/28/2011    Procedure: ESOPHAGOGASTRODUODENOSCOPY (EGD);  Surgeon: Louis Meckel, MD;  Location: [facility];  Service: Endoscopy;  Laterality: N/A;    Family History  Problem Relation Age of  Onset  . Diabetes Mother   . Diabetes Father   . Diabetes Brother     History  Substance Use Topics  . Smoking status: Former Smoker    Quit date: 07/27/1973  . Smokeless tobacco: Never Used  . Alcohol Use: No     Quit around 2001      Review of Systems  Gastrointestinal: Positive for abdominal pain.  All other systems reviewed and are negative.    Allergies  Review of patient's allergies indicates no known allergies.  Home Medications   Current Outpatient Rx  Name Route Sig Dispense Refill  . BENAZEPRIL HCL 40 MG PO TABS Oral Take 40 mg by mouth daily.     . TEARS RENEWED OP SOLN Both Eyes Place 1 drop into both eyes 3 (three) times daily as needed. For dry eyes    . HYDROCHLOROTHIAZIDE 25 MG PO TABS Oral Take 25 mg by mouth daily.    . INSULIN GLARGINE 100 UNIT/ML [place] SOLN Subcutaneous Inject 25 Units into the skin at bedtime.    Marland Kitchen METFORMIN HCL 500 MG PO TABS Oral Take 1,000 mg by mouth 2 (two) times daily with a meal.    . METOPROLOL TARTRATE 50 MG PO TABS Oral Take 25 mg by mouth daily.    . ADULT MULTIVITAMIN W/MINERALS CH Oral Take 1 tablet by mouth daily.    Marland Kitchen OMEPRAZOLE 40 MG PO CPDR Oral Take 40 mg by mouth  2 (two) times daily.    Marland Kitchen SIMVASTATIN 40 MG PO TABS Oral Take 40 mg by mouth every evening.      BP 149/85  Pulse 72  Temp(Src) 98.1 F (36.7 C) (Oral)  Resp 17  Ht 5\' 10"  (1.778 m)  Wt 261 lb (118.389 kg)  BMI 37.45 kg/m2  SpO2 97%  Physical Exam  Nursing note and vitals reviewed.  71 year old male who is resting comfortably and in no acute distress. Vital signs are significant for mild hypertension with blood pressure 149/85. Oxygen saturation is 97% which is normal. Head is normocephalic and atraumatic. PERRLA, EOMI. There is no scleral icterus. Oropharynx is clear. Neck is nontender and supple without adenopathy. Lungs are clear without rales, wheezes, rhonchi. Heart has regular rate and rhythm without murmur. Abdomen is slightly distended, soft,  with mild tenderness in the left upper abdomen. There is no rebound or guarding. There no masses or hepatosplenomegaly. There is no right upper quadrant or epigastric tenderness. There is no CVA tenderness. Peristalsis is present but diminished. Extremities: There is 2+ edema of the left leg and 1+ edema the right leg. The right leg is atrophied compared with the left and this is consistent with his history of polio. There is no cyanosis and capillary refill is prompt. Skin is warm and dry without rash. Neurologic: Mental status is normal, cranial nerves are intact, there no focal motor or sensory deficits.  ED Course  Procedures (including critical care time)  Results for orders placed during the hospital encounter of 09/04/11  COMPREHENSIVE METABOLIC PANEL      Component Value Range   Sodium 129 (*) 135 - 145 (mEq/L)   Potassium 3.6  3.5 - 5.1 (mEq/L)   Chloride 89 (*) 96 - 112 (mEq/L)   CO2 30  19 - 32 (mEq/L)   Glucose, Bld 206 (*) 70 - 99 (mg/dL)   BUN 5 (*) 6 - 23 (mg/dL)   Creatinine, Ser 4.09 (*) 0.50 - 1.35 (mg/dL)   Calcium 9.7  8.4 - 81.1 (mg/dL)   Total Protein 7.8  6.0 - 8.3 (g/dL)   Albumin 3.6  3.5 - 5.2 (g/dL)   AST 16  0 - 37 (U/L)   ALT 12  0 - 53 (U/L)   Alkaline Phosphatase 64  39 - 117 (U/L)   Total Bilirubin 0.6  0.3 - 1.2 (mg/dL)   GFR calc non Af Amer >90  >90 (mL/min)   GFR calc Af Amer >90  >90 (mL/min)  LIPASE, BLOOD      Component Value Range   Lipase 26  11 - 59 (U/L)  URINALYSIS, ROUTINE W REFLEX MICROSCOPIC      Component Value Range   Color, Urine YELLOW  YELLOW    APPearance CLEAR  CLEAR    Specific Gravity, Urine 1.008  1.005 - 1.030    pH 7.0  5.0 - 8.0    Glucose, UA NEGATIVE  NEGATIVE (mg/dL)   Hgb urine dipstick NEGATIVE  NEGATIVE    Bilirubin Urine NEGATIVE  NEGATIVE    Ketones, ur 15 (*) NEGATIVE (mg/dL)   Protein, ur NEGATIVE  NEGATIVE (mg/dL)   Urobilinogen, UA 0.2  0.0 - 1.0 (mg/dL)   Nitrite NEGATIVE  NEGATIVE    Leukocytes, UA  NEGATIVE  NEGATIVE   CBC      Component Value Range   WBC 6.3  4.0 - 10.5 (K/uL)   RBC 4.75  4.22 - 5.81 (MIL/uL)   Hemoglobin 13.5  13.0 -  17.0 (g/dL)   HCT 16.1  09.6 - 04.5 (%)   MCV 83.6  78.0 - 100.0 (fL)   MCH 28.4  26.0 - 34.0 (pg)   MCHC 34.0  30.0 - 36.0 (g/dL)   RDW 40.9  81.1 - 91.4 (%)   Platelets 285  150 - 400 (K/uL)  DIFFERENTIAL      Component Value Range   Neutrophils Relative 59  43 - 77 (%)   Neutro Abs 3.7  1.7 - 7.7 (K/uL)   Lymphocytes Relative 28  12 - 46 (%)   Lymphs Abs 1.8  0.7 - 4.0 (K/uL)   Monocytes Relative 12  3 - 12 (%)   Monocytes Absolute 0.7  0.1 - 1.0 (K/uL)   Eosinophils Relative 1  0 - 5 (%)   Eosinophils Absolute 0.1  0.0 - 0.7 (K/uL)   Basophils Relative 0  0 - 1 (%)   Basophils Absolute 0.0  0.0 - 0.1 (K/uL)   Ct Abdomen Pelvis W Contrast  09/05/2011  *RADIOLOGY REPORT*  Clinical Data: Left abdominal pain for 3 months.  Nausea and vomiting in the last 24 hours.  CT ABDOMEN AND PELVIS WITH CONTRAST  Technique:  Multidetector CT imaging of the abdomen and pelvis was performed following the standard protocol during bolus administration of intravenous contrast.  Contrast: OMNIPAQUE IOHEXOL 300 MG/ML IJ SOLN  Comparison: 07/27/2011  Findings: Atelectasis in the lung bases.  Prominent visceral adipose tissue with fatty atrophy of the abdominal wall and paraspinal muscles.  Cholelithiasis with stone in the gallbladder neck, similar to previous study.  The liver, spleen, gallbladder, adrenal glands, kidneys, pancreas, and retroperitoneal lymph nodes are unremarkable.  Calcification of the aorta and branch vessels without aneurysm.  Calcification and or stents in the renal artery origins.  No free air or free fluid in the abdomen.  The stomach, small bowel, and colon are decompressed. No evidence of obstruction.  Pelvis:  Enlarged prostate gland.  Bladder wall is not thickened. No free or loculated pelvic fluid collections.  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  The appendix is normal. Degenerative changes in the lumbar spine without abnormal alignment.  Metallic foreign body adjacent to the right acetabulum.  Overall stable appearance since previous study.  IMPRESSION: No specific abnormality identified to account for the patient's pain.  Cholelithiasis.  Enlarged prostate gland.  Degenerative changes in the spine.  Original Report Authenticated By: Marlon Pel, M.D.    He got good relief of pain from hydromorphone. Workup has not shown any serious intra-abdominal pathology. Large calcified gallstone is noted. He is referred to [facility] surgery for evaluation for possible elective cholecystectomy in a patient to is elderly, diabetic, who is an asymptomatic gallstone.  1. Abdominal pain   2. Cholelithiasis       MDM  Abdominal pain of uncertain cause. Diarrhea of uncertain cause. I reviewed his records and he had been admitted to the hospital in February and had workup including CT scan and EGD. He was found to have gallstones and was to be referred to [facility] surgery. He was felt to possibly have an ulcer and was sent home on a proton pump inhibitor.        Dione Booze, MD 09/05/11 [phone]

## 2011-09-05 NOTE — Discharge Instructions (Signed)
Please contact with the surgeon to be evaluated regarding whether he should have your gallbladder taken out.  Abdominal Pain Abdominal pain can be caused by many things. Your caregiver decides the seriousness of your pain by an examination and possibly blood tests and X-rays. Many cases can be observed and treated at home. Most abdominal pain is not caused by a disease and will probably improve without treatment. However, in many cases, more time must pass before a clear cause of the pain can be found. Before that point, it may not be known if you need more testing, or if hospitalization or surgery is needed. HOME CARE INSTRUCTIONS   Do not take laxatives unless directed by your caregiver.   Take pain medicine only as directed by your caregiver.   Only take over-the-counter or prescription medicines for pain, discomfort, or fever as directed by your caregiver.   Try a clear liquid diet (broth, tea, or water) for as long as directed by your caregiver. Slowly move to a bland diet as tolerated.  SEEK IMMEDIATE MEDICAL CARE IF:   The pain does not go away.   You have a fever.   You keep throwing up (vomiting).   The pain is felt only in portions of the abdomen. Pain in the right side could possibly be appendicitis. In an adult, pain in the left lower portion of the abdomen could be colitis or diverticulitis.   You pass bloody or black tarry stools.  MAKE SURE YOU:   Understand these instructions.   Will watch your condition.   Will get help right away if you are not doing well or get worse.  Document Released: 03/02/2005 Document Revised: 05/12/2011 Document Reviewed: 01/09/2008 [facility] Patient Information 2012 [place], [place].  Cholelithiasis Cholelithiasis (also called gallstones) is a form of gallbladder disease where gallstones form in your gallbladder. The gallbladder is a non-essential organ that stores bile made in the liver, which helps digest fats. Gallstones begin as small  crystals and slowly grow into stones. Gallstone pain occurs when the gallbladder spasms, and a gallstone is blocking the duct. Pain can also occur when a stone passes out of the duct.  Women are more likely to develop gallstones than men. Other factors that increase the risk of gallbladder disease are:  Having multiple pregnancies. Physicians sometimes advise removing diseased gallbladders before future pregnancies.   Obesity.   Diets heavy in fried foods and fat.   Increasing age (older than 45).   Prolonged use of medications containing male hormones.   Diabetes mellitus.   Rapid weight loss.   Family history of gallstones (heredity).  SYMPTOMS  Feeling sick to your stomach (nauseous).   Abdominal pain.   Yellowing of the skin (jaundice).   Sudden pain. It may persist from several minutes to several hours.   Worsening pain with deep breathing or when jarred.   Fever.   Tenderness to the touch.  In some cases, when gallstones do not move into the bile duct, people have no pain or symptoms. These are called "silent" gallstones. TREATMENT In severe cases, emergency surgery may be required. HOME CARE INSTRUCTIONS   Only take over-the-counter or prescription medicines for pain, discomfort, or fever as directed by your caregiver.   Follow a low-fat diet until seen again. Fat causes the gallbladder to contract, which can result in pain.   Follow up as instructed. Attacks are almost always recurrent and surgery is usually required for permanent treatment.  SEEK IMMEDIATE MEDICAL CARE IF:  Your pain increases and is not controlled by medications.   You have an oral temperature above 102 F (38.9 C), not controlled by medication.   You develop nausea and vomiting.  MAKE SURE YOU:   Understand these instructions.   Will watch your condition.   Will get help right away if you are not doing well or get worse.  Document Released: 05/19/2005 Document Revised:  05/12/2011 Document Reviewed: 07/22/2010 [facility] Patient Information 2012 [place], [place].  Metoclopramide tablets What is this medicine? METOCLOPRAMIDE (met oh kloe PRA mide) is used to treat the symptoms of gastroesophageal reflux disease (GERD) like heartburn. It is also used to treat people with slow emptying of the stomach and intestinal tract. This medicine may be used for other purposes; ask your health care provider or pharmacist if you have questions. What should I tell my health care provider before I take this medicine? They need to know if you have any of these conditions: -breast cancer -depression -diabetes -heart failure -high blood pressure -kidney disease -liver disease -Parkinson's disease or a movement disorder -pheochromocytoma -seizures -stomach obstruction, bleeding, or perforation -an unusual or allergic reaction to metoclopramide, procainamide, sulfites, other medicines, foods, dyes, or preservatives -pregnant or trying to get pregnant -breast-feeding How should I use this medicine? Take this medicine by mouth with a glass of water. Follow the directions on the prescription label. Take this medicine on an empty stomach, about 30 minutes before eating. Take your doses at regular intervals. Do not take your medicine more often than directed. Do not stop taking except on the advice of your doctor or health care professional. A special MedGuide will be given to you by the pharmacist with each prescription and refill. Be sure to read this information carefully each time. Talk to your pediatrician regarding the use of this medicine in children. Special care may be needed. Overdosage: If you think you have taken too much of this medicine contact a poison control center or emergency room at once. NOTE: This medicine is only for you. Do not share this medicine with others. What if I miss a dose? If you miss a dose, take it as soon as you can. If it is almost time for  your next dose, take only that dose. Do not take double or extra doses. What may interact with this medicine? -acetaminophen -cyclosporine -digoxin -medicines for blood pressure -medicines for diabetes, including insulin -medicines for hay fever and other allergies -medicines for depression, especially an Monoamine Oxidase Inhibitor (MAOI) -medicines for Parkinson's disease, like levodopa -medicines for sleep or for pain -tetracycline This list may not describe all possible interactions. Give your health care provider a list of all the medicines, herbs, non-prescription drugs, or dietary supplements you use. Also tell them if you smoke, drink alcohol, or use illegal drugs. Some items may interact with your medicine. What should I watch for while using this medicine? It may take a few weeks for your stomach condition to start to get better. However, do not take this medicine for longer than 12 weeks. The longer you take this medicine, and the more you take it, the greater your chances are of developing serious side effects. If you are an elderly patient, a male patient, or you have diabetes, you may be at an increased risk for side effects from this medicine. Contact your doctor immediately if you start having movements you cannot control such as lip smacking, rapid movements of the tongue, involuntary or uncontrollable movements of the eyes, head,  arms and legs, or muscle twitches and spasms. Patients and their families should watch out for worsening depression or thoughts of suicide. Also watch out for any sudden or severe changes in feelings such as feeling anxious, agitated, panicky, irritable, hostile, aggressive, impulsive, severely restless, overly excited and hyperactive, or not being able to sleep. If this happens, especially at the beginning of treatment or after a change in dose, call your doctor. Do not treat yourself for high fever. Ask your doctor or health care professional for  advice. You may get drowsy or dizzy. Do not drive, use machinery, or do anything that needs mental alertness until you know how this drug affects you. Do not stand or sit up quickly, especially if you are an older patient. This reduces the risk of dizzy or fainting spells. Alcohol can make you more drowsy and dizzy. Avoid alcoholic drinks. What side effects may I notice from receiving this medicine? Side effects that you should report to your doctor or health care professional as soon as possible: -allergic reactions like skin rash, itching or hives, swelling of the face, lips, or tongue -abnormal production of milk in females -breast enlargement in both males and females -change in the way you walk -difficulty moving, speaking or swallowing -drooling, lip smacking, or rapid movements of the tongue -excessive sweating -fever -involuntary or uncontrollable movements of the eyes, head, arms and legs -irregular heartbeat or palpitations -muscle twitches and spasms -unusually weak or tired Side effects that usually do not require medical attention (report to your doctor or health care professional if they continue or are bothersome): -change in sex drive or performance -depressed mood -diarrhea -difficulty sleeping -headache -menstrual changes -restless or nervous This list may not describe all possible side effects. Call your doctor for medical advice about side effects. You may report side effects to FDA at 1-800-FDA-1088. Where should I keep my medicine? Keep out of the reach of children. Store at room temperature between 20 and 25 degrees C (68 and 77 degrees F). Protect from light. Keep container tightly closed. Throw away any unused medicine after the expiration date. NOTE: This sheet is a summary. It may not cover all possible information. If you have questions about this medicine, talk to your doctor, pharmacist, or health care provider.  2012, [employer]. (01/16/2008  4:30:05 PM)  Acetaminophen; Oxycodone tablets What is this medicine? ACETAMINOPHEN; OXYCODONE (a set a MEE noe fen; ox i KOE done) is a pain reliever. It is used to treat mild to moderate pain. This medicine may be used for other purposes; ask your health care provider or pharmacist if you have questions. What should I tell my health care provider before I take this medicine? They need to know if you have any of these conditions: -brain tumor -Crohn's disease, inflammatory bowel disease, or ulcerative colitis -drink more than 3 alcohol containing drinks per day -drug abuse or addiction -head injury -heart or circulation problems -kidney disease or problems going to the bathroom -liver disease -lung disease, asthma, or breathing problems -an unusual or allergic reaction to acetaminophen, oxycodone, other opioid analgesics, other medicines, foods, dyes, or preservatives -pregnant or trying to get pregnant -breast-feeding How should I use this medicine? Take this medicine by mouth with a full glass of water. Follow the directions on the prescription label. Take your medicine at regular intervals. Do not take your medicine more often than directed. Talk to your pediatrician regarding the use of this medicine in children. Special care may be needed.  Patients over 65 years old may have a stronger reaction and need a smaller dose. Overdosage: If you think you have taken too much of this medicine contact a poison control center or emergency room at once. NOTE: This medicine is only for you. Do not share this medicine with others. What if I miss a dose? If you miss a dose, take it as soon as you can. If it is almost time for your next dose, take only that dose. Do not take double or extra doses. What may interact with this medicine? -alcohol or medicines that contain alcohol -antihistamines -barbiturates like amobarbital, butalbital, butabarbital, methohexital, pentobarbital, phenobarbital,  thiopental, and secobarbital -benztropine -drugs for bladder problems like solifenacin, trospium, oxybutynin, tolterodine, hyoscyamine, and methscopolamine -drugs for breathing problems like ipratropium and tiotropium -drugs for certain stomach or intestine problems like propantheline, homatropine methylbromide, glycopyrrolate, atropine, belladonna, and dicyclomine -general anesthetics like etomidate, ketamine, nitrous oxide, propofol, desflurane, enflurane, halothane, isoflurane, and sevoflurane -medicines for depression, anxiety, or psychotic disturbances -medicines for pain like codeine, morphine, pentazocine, buprenorphine, butorphanol, nalbuphine, tramadol, and propoxyphene -medicines for sleep -muscle relaxants -naltrexone -phenothiazines like perphenazine, thioridazine, chlorpromazine, mesoridazine, fluphenazine, prochlorperazine, promazine, and trifluoperazine -scopolamine -trihexyphenidyl This list may not describe all possible interactions. Give your health care provider a list of all the medicines, herbs, non-prescription drugs, or dietary supplements you use. Also tell them if you smoke, drink alcohol, or use illegal drugs. Some items may interact with your medicine. What should I watch for while using this medicine? Tell your doctor or health care professional if your pain does not go away, if it gets worse, or if you have new or a different type of pain. You may develop tolerance to the medicine. Tolerance means that you will need a higher dose of the medication for pain relief. Tolerance is normal and is expected if you take this medicine for a long time. Do not suddenly stop taking your medicine because you may develop a severe reaction. Your body becomes used to the medicine. This does NOT mean you are addicted. Addiction is a behavior related to getting and using a drug for a nonmedical reason. If you have pain, you have a medical reason to take pain medicine. Your doctor will tell  you how much medicine to take. If your doctor wants you to stop the medicine, the dose will be slowly lowered over time to avoid any side effects. You may get drowsy or dizzy. Do not drive, use machinery, or do anything that needs mental alertness until you know how this medicine affects you. Do not stand or sit up quickly, especially if you are an older patient. This reduces the risk of dizzy or fainting spells. Alcohol may interfere with the effect of this medicine. Avoid alcoholic drinks. The medicine will cause constipation. Try to have a bowel movement at least every 2 to 3 days. If you do not have a bowel movement for 3 days, call your doctor or health care professional. Do not take Tylenol (acetaminophen) or medicines that have acetaminophen with this medicine. Too much acetaminophen can be very dangerous. Many nonprescription medicines contain acetaminophen. Always read the labels carefully to avoid taking more acetaminophen. What side effects may I notice from receiving this medicine? Side effects that you should report to your doctor or health care professional as soon as possible: -allergic reactions like skin rash, itching or hives, swelling of the face, lips, or tongue -breathing difficulties, wheezing -confusion -light headedness or fainting spells -severe stomach pain -  yellowing of the skin or the whites of the eyes Side effects that usually do not require medical attention (report to your doctor or health care professional if they continue or are bothersome): -dizziness -drowsiness -nausea -vomiting This list may not describe all possible side effects. Call your doctor for medical advice about side effects. You may report side effects to FDA at 1-800-FDA-1088. Where should I keep my medicine? Keep out of the reach of children. This medicine can be abused. Keep your medicine in a safe place to protect it from theft. Do not share this medicine with anyone. Selling or giving away this  medicine is dangerous and against the law. Store at room temperature between 20 and 25 degrees C (68 and 77 degrees F). Keep container tightly closed. Protect from light. Flush any unused medicines down the toilet. Do not use the medicine after the expiration date. NOTE: This sheet is a summary. It may not cover all possible information. If you have questions about this medicine, talk to your doctor, pharmacist, or health care provider.  2012, [employer]. (04/21/2008 10:01:21 AM)

## 2011-09-09 ENCOUNTER — Telehealth (INDEPENDENT_AMBULATORY_CARE_PROVIDER_SITE_OTHER): Payer: Self-pay

## 2011-09-09 ENCOUNTER — Emergency Department (HOSPITAL_COMMUNITY): Payer: Medicare Other

## 2011-09-09 ENCOUNTER — Encounter (HOSPITAL_COMMUNITY): Payer: Self-pay | Admitting: Emergency Medicine

## 2011-09-09 ENCOUNTER — Inpatient Hospital Stay (HOSPITAL_COMMUNITY)
Admission: EM | Admit: 2011-09-09 | Discharge: 2011-09-15 | DRG: 418 | Disposition: A | Discharge status: Skilled Nursing Facility | Payer: Medicare Other | Attending: General Surgery | Admitting: General Surgery

## 2011-09-09 DIAGNOSIS — IMO0001 Reserved for inherently not codable concepts without codable children: Secondary | ICD-10-CM | POA: Diagnosis present

## 2011-09-09 DIAGNOSIS — K8010 Calculus of gallbladder with chronic cholecystitis without obstruction: Secondary | ICD-10-CM | POA: Diagnosis present

## 2011-09-09 DIAGNOSIS — K8000 Calculus of gallbladder with acute cholecystitis without obstruction: Principal | ICD-10-CM | POA: Diagnosis present

## 2011-09-09 DIAGNOSIS — K7030 Alcoholic cirrhosis of liver without ascites: Secondary | ICD-10-CM | POA: Diagnosis present

## 2011-09-09 DIAGNOSIS — E876 Hypokalemia: Secondary | ICD-10-CM

## 2011-09-09 DIAGNOSIS — B91 Sequelae of poliomyelitis: Secondary | ICD-10-CM

## 2011-09-09 DIAGNOSIS — R5381 Other malaise: Secondary | ICD-10-CM | POA: Diagnosis not present

## 2011-09-09 DIAGNOSIS — Z794 Long term (current) use of insulin: Secondary | ICD-10-CM

## 2011-09-09 DIAGNOSIS — E785 Hyperlipidemia, unspecified: Secondary | ICD-10-CM | POA: Diagnosis present

## 2011-09-09 DIAGNOSIS — Z79899 Other long term (current) drug therapy: Secondary | ICD-10-CM

## 2011-09-09 DIAGNOSIS — E119 Type 2 diabetes mellitus without complications: Secondary | ICD-10-CM

## 2011-09-09 DIAGNOSIS — IMO0002 Reserved for concepts with insufficient information to code with codable children: Secondary | ICD-10-CM | POA: Diagnosis present

## 2011-09-09 DIAGNOSIS — K219 Gastro-esophageal reflux disease without esophagitis: Secondary | ICD-10-CM | POA: Diagnosis present

## 2011-09-09 DIAGNOSIS — Z87891 Personal history of nicotine dependence: Secondary | ICD-10-CM

## 2011-09-09 DIAGNOSIS — E1165 Type 2 diabetes mellitus with hyperglycemia: Secondary | ICD-10-CM | POA: Diagnosis present

## 2011-09-09 DIAGNOSIS — Z981 Arthrodesis status: Secondary | ICD-10-CM

## 2011-09-09 DIAGNOSIS — I10 Essential (primary) hypertension: Secondary | ICD-10-CM | POA: Diagnosis present

## 2011-09-09 DIAGNOSIS — E871 Hypo-osmolality and hyponatremia: Secondary | ICD-10-CM | POA: Diagnosis present

## 2011-09-09 DIAGNOSIS — M1990 Unspecified osteoarthritis, unspecified site: Secondary | ICD-10-CM | POA: Diagnosis present

## 2011-09-09 DIAGNOSIS — Z8612 Personal history of poliomyelitis: Secondary | ICD-10-CM | POA: Diagnosis present

## 2011-09-09 DIAGNOSIS — R197 Diarrhea, unspecified: Secondary | ICD-10-CM | POA: Diagnosis not present

## 2011-09-09 DIAGNOSIS — K8020 Calculus of gallbladder without cholecystitis without obstruction: Secondary | ICD-10-CM | POA: Diagnosis present

## 2011-09-09 DIAGNOSIS — D509 Iron deficiency anemia, unspecified: Secondary | ICD-10-CM | POA: Diagnosis present

## 2011-09-09 DIAGNOSIS — K5900 Constipation, unspecified: Secondary | ICD-10-CM | POA: Diagnosis present

## 2011-09-09 LAB — URINALYSIS, ROUTINE W REFLEX MICROSCOPIC
Hgb urine dipstick: NEGATIVE
Ketones, ur: >80 mg/dL — AB
Nitrite: NEGATIVE
Protein, ur: NEGATIVE mg/dL
Urobilinogen, UA: 1 mg/dL (ref 0.0–1.0)

## 2011-09-09 LAB — CBC
Hemoglobin: 14.4 g/dL (ref 13.0–17.0)
MCH: 27.6 pg (ref 26.0–34.0)
MCV: 81.2 fL (ref 78.0–100.0)
RBC: 5.22 MIL/uL (ref 4.22–5.81)
WBC: 6.2 10*3/uL (ref 4.0–10.5)

## 2011-09-09 LAB — COMPREHENSIVE METABOLIC PANEL
ALT: 11 U/L (ref 0–53)
Alkaline Phosphatase: 72 U/L (ref 39–117)
BUN: 5 mg/dL — ABNORMAL LOW (ref 6–23)
CO2: 29 mEq/L (ref 19–32)
Chloride: 87 mEq/L — ABNORMAL LOW (ref 96–112)
GFR calc Af Amer: >90 mL/min (ref >90)
GFR calc non Af Amer: >90 mL/min (ref >90)
Glucose, Bld: 207 mg/dL — ABNORMAL HIGH (ref 70–99)
Potassium: 3.1 mEq/L — ABNORMAL LOW (ref 3.5–5.1)
Sodium: 128 mEq/L — ABNORMAL LOW (ref 135–145)
Total Bilirubin: 0.5 mg/dL (ref 0.3–1.2)
Total Protein: 7.6 g/dL (ref 6.0–8.3)

## 2011-09-09 LAB — DIFFERENTIAL
Eosinophils Absolute: 0.1 10*3/uL (ref 0.0–0.7)
Lymphocytes Relative: 18 % (ref 12–46)
Lymphs Abs: 1.1 10*3/uL (ref 0.7–4.0)
Monocytes Relative: 11 % (ref 3–12)
Neutrophils Relative %: 71 % (ref 43–77)

## 2011-09-09 LAB — LIPASE, BLOOD: Lipase: 39 U/L (ref 11–59)

## 2011-09-09 LAB — GLUCOSE, CAPILLARY: Glucose-Capillary: 162 mg/dL — ABNORMAL HIGH (ref 70–99)

## 2011-09-09 MED ORDER — BENAZEPRIL HCL 40 MG PO TABS
40.0000 mg | ORAL_TABLET | Freq: Every day | ORAL | Status: DC
  Administered 2011-09-10: 40 mg via ORAL
  Filled 2011-09-09 (×2): qty 1

## 2011-09-09 MED ORDER — ONDANSETRON HCL 4 MG/2ML IJ SOLN: 4.0000 mg | Freq: Four times a day (QID) | INTRAMUSCULAR | Status: DC | PRN

## 2011-09-09 MED ORDER — POLYVINYL ALCOHOL 1.4 % OP SOLN: 1.0000 [drp] | Freq: Three times a day (TID) | OPHTHALMIC | Status: DC | PRN

## 2011-09-09 MED ORDER — METOPROLOL TARTRATE 25 MG PO TABS
25.0000 mg | ORAL_TABLET | Freq: Every day | ORAL | Status: DC
  Administered 2011-09-10 – 2011-09-15 (×6): 25 mg via ORAL
  Filled 2011-09-09 (×7): qty 1

## 2011-09-09 MED ORDER — HYDROCHLOROTHIAZIDE 25 MG PO TABS
25.0000 mg | ORAL_TABLET | Freq: Every day | ORAL | Status: DC
  Administered 2011-09-10 – 2011-09-15 (×6): 25 mg via ORAL
  Filled 2011-09-09 (×6): qty 1

## 2011-09-09 MED ORDER — DOCUSATE SODIUM 100 MG PO CAPS
100.0000 mg | ORAL_CAPSULE | Freq: Two times a day (BID) | ORAL | Status: DC
  Administered 2011-09-10 – 2011-09-13 (×7): 100 mg via ORAL
  Filled 2011-09-09 (×10): qty 1

## 2011-09-09 MED ORDER — POLYETHYLENE GLYCOL 3350 17 G PO PACK
17.0000 g | PACK | Freq: Every day | ORAL | Status: DC
  Administered 2011-09-10: 17 g via ORAL
  Filled 2011-09-09 (×2): qty 1

## 2011-09-09 MED ORDER — MORPHINE SULFATE 4 MG/ML IJ SOLN
4.0000 mg | Freq: Once | INTRAMUSCULAR | Status: AC
  Administered 2011-09-09: 4 mg via INTRAVENOUS
  Filled 2011-09-09: qty 1

## 2011-09-09 MED ORDER — INSULIN ASPART 100 UNIT/ML ~~LOC~~ SOLN
0.0000 [IU] | SUBCUTANEOUS | Status: DC
  Administered 2011-09-11: 11 [IU] via SUBCUTANEOUS
  Administered 2011-09-11: 7 [IU] via SUBCUTANEOUS
  Administered 2011-09-12: 3 [IU] via SUBCUTANEOUS
  Administered 2011-09-12 (×2): 4 [IU] via SUBCUTANEOUS

## 2011-09-09 MED ORDER — PANTOPRAZOLE SODIUM 40 MG IV SOLR
40.0000 mg | Freq: Every day | INTRAVENOUS | Status: DC
  Administered 2011-09-10 – 2011-09-11 (×2): 40 mg via INTRAVENOUS
  Filled 2011-09-09 (×3): qty 40

## 2011-09-09 MED ORDER — TEARS RENEWED OP SOLN: 1.0000 [drp] | Freq: Three times a day (TID) | OPHTHALMIC | Status: DC | PRN

## 2011-09-09 MED ORDER — ONDANSETRON HCL 4 MG/2ML IJ SOLN
4.0000 mg | Freq: Once | INTRAMUSCULAR | Status: AC
  Administered 2011-09-09: 4 mg via INTRAVENOUS
  Filled 2011-09-09: qty 2

## 2011-09-09 MED ORDER — ENOXAPARIN SODIUM 40 MG/0.4ML ~~LOC~~ SOLN
40.0000 mg | Freq: Every day | SUBCUTANEOUS | Status: DC
  Administered 2011-09-10: 40 mg via SUBCUTANEOUS
  Filled 2011-09-09 (×2): qty 0.4

## 2011-09-09 MED ORDER — SIMVASTATIN 40 MG PO TABS
40.0000 mg | ORAL_TABLET | Freq: Every evening | ORAL | Status: DC
  Administered 2011-09-10 – 2011-09-14 (×5): 40 mg via ORAL
  Filled 2011-09-09 (×6): qty 1

## 2011-09-09 MED ORDER — FLEET ENEMA 7-19 GM/118ML RE ENEM
1.0000 | ENEMA | Freq: Once | RECTAL | Status: AC
  Administered 2011-09-10: 1 via RECTAL
  Filled 2011-09-09 (×2): qty 1

## 2011-09-09 MED ORDER — DIPHENHYDRAMINE HCL 12.5 MG/5ML PO ELIX
12.5000 mg | ORAL_SOLUTION | Freq: Four times a day (QID) | ORAL | Status: DC | PRN
  Filled 2011-09-09: qty 5

## 2011-09-09 MED ORDER — DIPHENHYDRAMINE HCL 50 MG/ML IJ SOLN: 12.5000 mg | Freq: Four times a day (QID) | INTRAMUSCULAR | Status: DC | PRN

## 2011-09-09 MED ORDER — POTASSIUM CHLORIDE CRYS ER 20 MEQ PO TBCR
40.0000 meq | EXTENDED_RELEASE_TABLET | Freq: Once | ORAL | Status: AC
  Administered 2011-09-09: 40 meq via ORAL
  Filled 2011-09-09: qty 2

## 2011-09-09 MED ORDER — SODIUM CHLORIDE 0.9 % IV BOLUS (SEPSIS)
1000.0000 mL | Freq: Once | INTRAVENOUS | Status: AC
  Administered 2011-09-09: 1000 mL via INTRAVENOUS

## 2011-09-09 MED ORDER — POTASSIUM CHLORIDE IN NACL 20-0.9 MEQ/L-% IV SOLN
INTRAVENOUS | Status: DC
  Administered 2011-09-09 – 2011-09-15 (×13): via INTRAVENOUS
  Filled 2011-09-09 (×22): qty 1000

## 2011-09-09 MED ORDER — MORPHINE SULFATE 2 MG/ML IJ SOLN
2.0000 mg | INTRAMUSCULAR | Status: DC | PRN
  Administered 2011-09-10 – 2011-09-11 (×6): 2 mg via INTRAVENOUS
  Administered 2011-09-11: 4 mg via INTRAVENOUS
  Filled 2011-09-09: qty 1
  Filled 2011-09-09: qty 2
  Filled 2011-09-09 (×5): qty 1

## 2011-09-09 NOTE — ED Notes (Signed)
Iv team is on the way here

## 2011-09-09 NOTE — ED Notes (Signed)
The pt has had blood drawn and iv team is coming to start iv..  Pt to xray.  Alert no distress.  abd pain

## 2011-09-09 NOTE — ED Notes (Signed)
The pt  Is c/o more abd pain.  Med given.  He is experiencing some burning and itching along the vein in hiis rt iv site. No other  Itching observed  No sob

## 2011-09-09 NOTE — ED Notes (Signed)
Pt was seen at [facility] for LUQ pain with no diagnosis.  Seen by PCP Monday for same with again no diagnosis.  Pain has been constant.  Pt reports not having BM for 6-7 days but denies that is the cause of his problem.  Pt reports vomiting 1x last week but not since. Pt has not taken anything OTC for constipation.  Pt reports [place] gave him hydrocodone and it made him itch.  Was given percocet instead.

## 2011-09-09 NOTE — Telephone Encounter (Signed)
Pt calling to see if can get sooner appt with Dr Janee Morn. Pt states his discomfort is on his left side of abdomen with some nausea. Pt states this has been going on or 33mo and is wanting to be seen sooner. Pt advised if pain increases or vomiting occurs he needs to go to ER for eval. Pt advised I will send msg to Dr Janee Morn and [place] [facility] to review schedule for sooner appt.

## 2011-09-09 NOTE — ED Provider Notes (Signed)
History     CSN: [id number]  Arrival date & time 09/09/11  1640   First MD Initiated Contact with Patient 09/09/11 1656      Chief Complaint  Patient presents with  . Abdominal Pain    (Consider location/radiation/quality/duration/timing/severity/associated sxs/prior treatment) HPI Comments: 71 yo male with 3-4 weeks of constant LUQ abd pain. Worse with food and then gets better, but never resolves. + Nausea but no vomiting. No BM in 7 days. No blood in stool since 2 months ago. Pain does not radiate anywhere. Has been to ED and PCP multiple times for this pain, and had a negative CT 4-5 days ago (except for gallstones without cholecystitis). Pain has been gradually worsening now, so he called his PCP and came to ED. No cough, dyspnea or chest pain. No back pain or fever.   Patient is a 71 y.o. male presenting with abdominal pain. The history is provided by the patient.  Abdominal Pain The primary symptoms of the illness include abdominal pain and nausea. The primary symptoms of the illness do not include fever, shortness of breath, vomiting or dysuria. Episode onset: 3-4 weeks ago. The onset of the illness was sudden. The problem has been gradually worsening.  The abdominal pain is located in the LUQ and epigastric region. The abdominal pain does not radiate. The severity of the abdominal pain is 8/10. The abdominal pain is relieved by nothing. The abdominal pain is exacerbated by eating.  Additional symptoms associated with the illness include heartburn and constipation (no BM in 7 days). Symptoms associated with the illness do not include chills, urgency, hematuria, frequency or back pain.    Past Medical History  Diagnosis Date  . Diabetes mellitus   . Hypertension   . History of UTI   . Hyperlipidemia   . GERD (gastroesophageal reflux disease)   . History of post-polio syndrome   . H/O acute alcoholic hepatitis   . Cervical myelopathy     Hx of  . Right patella fracture   .  Right sided weakness   . Iron deficiency anemia     Past Surgical History  Procedure Date  . Cervical fusion 04/16/2003  . Orif right femur fracture   . Radioactive iodine treatment 1984  . Esophagogastroduodenoscopy 07/28/2011    Procedure: ESOPHAGOGASTRODUODENOSCOPY (EGD);  Surgeon: Louis Meckel, MD;  Location: [facility];  Service: Endoscopy;  Laterality: N/A;    Family History  Problem Relation Age of Onset  . Diabetes Mother   . Diabetes Father   . Diabetes Brother     History  Substance Use Topics  . Smoking status: Former Smoker    Quit date: 07/27/1973  . Smokeless tobacco: Never Used  . Alcohol Use: No     Quit around 2001      Review of Systems  Constitutional: Negative for fever and chills.  HENT: Negative for congestion and rhinorrhea.   Respiratory: Negative for cough and shortness of breath.   Cardiovascular: Negative for chest pain and leg swelling.  Gastrointestinal: Positive for heartburn, nausea, abdominal pain and constipation (no BM in 7 days). Negative for vomiting, blood in stool and abdominal distention.  Genitourinary: Negative for dysuria, urgency, frequency, hematuria and decreased urine volume.  Musculoskeletal: Negative for back pain.  Neurological: Negative for numbness and headaches.  Psychiatric/Behavioral: Negative for confusion.  All other systems reviewed and are negative.    Allergies  Review of patient's allergies indicates no known allergies.  Home Medications   Current Outpatient  Rx  Name Route Sig Dispense Refill  . BENAZEPRIL HCL 40 MG PO TABS Oral Take 40 mg by mouth daily.     . TEARS RENEWED OP SOLN Both Eyes Place 1 drop into both eyes 3 (three) times daily as needed. For dry eyes    . HYDROCHLOROTHIAZIDE 25 MG PO TABS Oral Take 25 mg by mouth daily.    Marland Kitchen HYDROCODONE-ACETAMINOPHEN 5-325 MG PO TABS Oral Take 1 tablet by mouth every 6 (six) hours as needed. For pain    . INSULIN GLARGINE 100 UNIT/ML [place] SOLN  Subcutaneous Inject 20-30 Units into the skin at bedtime. Per sliding scale    . METFORMIN HCL 500 MG PO TABS Oral Take 1,000 mg by mouth 2 (two) times daily with a meal.    . METOCLOPRAMIDE HCL 10 MG PO TABS Oral Take 10 mg by mouth every 6 (six) hours as needed. For nausea    . METOPROLOL TARTRATE 25 MG PO TABS Oral Take 25 mg by mouth daily.    . ADULT MULTIVITAMIN W/MINERALS CH Oral Take 1 tablet by mouth daily.    Marland Kitchen OMEPRAZOLE 40 MG PO CPDR Oral Take 40 mg by mouth 2 (two) times daily.    Marland Kitchen SIMVASTATIN 40 MG PO TABS Oral Take 40 mg by mouth every evening.      BP 155/92  Pulse 80  Temp(Src) 97.9 F (36.6 C) (Oral)  Resp 18  SpO2 96%  Physical Exam  Nursing note and vitals reviewed. Constitutional: He is oriented to person, place, and time. He appears well-developed and well-nourished.  HENT:  Head: Normocephalic and atraumatic.  Right Ear: External ear normal.  Left Ear: External ear normal.  Nose: Nose normal.  Neck: Neck supple.  Cardiovascular: Normal rate, regular rhythm, normal heart sounds and intact distal pulses.   Pulmonary/Chest: Effort normal.  Abdominal: Soft. He exhibits no distension and no mass. There is tenderness in the epigastric area and left upper quadrant. There is no rigidity, no rebound and no guarding.  Genitourinary: Rectum normal. Guaiac negative stool.  Musculoskeletal: He exhibits no edema.  Lymphadenopathy:    He has no cervical adenopathy.  Neurological: He is alert and oriented to person, place, and time.  Skin: Skin is warm and dry.    ED Course  Procedures (including critical care time)  Labs Reviewed  COMPREHENSIVE METABOLIC PANEL - Abnormal; Notable for the following:    Sodium 128 (*)    Potassium 3.1 (*)    Chloride 87 (*)    Glucose, Bld 207 (*)    BUN 5 (*)    Creatinine, Ser 0.40 (*)    All other components within normal limits  URINALYSIS, ROUTINE W REFLEX MICROSCOPIC - Abnormal; Notable for the following:    Bilirubin  Urine SMALL (*)    Ketones, ur >80 (*)    All other components within normal limits  GLUCOSE, CAPILLARY - Abnormal; Notable for the following:    Glucose-Capillary 162 (*)    All other components within normal limits  CBC  DIFFERENTIAL  LIPASE, BLOOD  OCCULT BLOOD, POC DEVICE  COMPREHENSIVE METABOLIC PANEL  CBC  PROTIME-INR  APTT  HEMOGLOBIN A1C  SURGICAL PCR SCREEN   Dg Abd Acute W/chest  09/09/2011  *RADIOLOGY REPORT*  Clinical Data: Left sided abdominal pain for 3 weeks, mid chest pain, cough, former smoking history  ACUTE ABDOMEN SERIES (ABDOMEN 2 VIEW & CHEST 1 VIEW)  Comparison: CT abdomen pelvis of 09/05/2011  Findings: Mild under aeration of  the lung bases is noted.  There is cardiomegaly present.  No infiltrate or effusion is seen.  Supine and left lateral decubitus films of the abdomen show no bowel obstruction.  No free air is seen.  Some residual contrast is noted scattered throughout the nondistended colon.  There are diffuse degenerative changes throughout the lumbar spine.  IMPRESSION:  1.  Cardiomegaly.  Suboptimal inspiration with mild basilar volume loss. 2.  No bowel obstruction.  No free air.  Original Report Authenticated By: Juline Patch, M.D.     Diagnosis 1. Abdominal Pain 2. Constipation 3. Hypokalemia    MDM  71 yo male presents with worsening abd pain. Tenderness as above, though no peritoneal signs of acute abd. Constipation on rectal exam but no impaction. Xrays confirms large stool burden. Pain better controlled with morphine. Patient insistent on seeing his surgeon (has follow up in 4-5 days in clinic). Discussed with surgeon on call (Dr. Andrey Campanile), who will admit patient for cholecystectomy as cause may be his symptomatic gallstones. No evidence of cholecystitis.         Pricilla Loveless, MD 09/10/11 0100

## 2011-09-09 NOTE — H&P (Addendum)
Timothy Gross is an 71 y.o. male.   Chief Complaint: epigastric / luq pain HPI: 71 year old morbidly obese African American male with a history of diabetes mellitus, hypertension, hyperlipidemia, gastroesophageal reflux disease, history of post polio syndrome comes to the ER complaining of ongoing epigastric and left upper quadrant pain. This has been going on for several months. He has been to the emergency room several times. His most recent trip to the emergency room was on April 1. He is scheduled to see Dr. Janee Morn this Tuesday. He states for the past 3 weeks he's had constant left upper quadrant as well as epigastric pain. The pain is worsened after eating. He describes it as a sharp pain. It is associated with nausea. He only has vomited once. He denies any hematemesis, melena, or hematochezia. He has not had a bowel movement in over a week. He normally has a bowel movement every day. However he has been taking some oxycodone every day since it was given to him in the emergency department. He was actually admitted to [facility] hospital in February for similar complaints. He had a CT scan performed in February which demonstrated some mild thickening around the duodenum. It was thought he might have an ulcer. He was seen by gastroenterology and an upper endoscopy was performed. His upper endoscopy was completely normal. The patient continues to take [facility] powders on a daily basis. He denies any bloating. He denies any jaundice or pruritus. He has had some mild LFT elevation in Feb 2013.  Past Medical History  Diagnosis Date  . Diabetes mellitus   . Hypertension   . History of UTI   . Hyperlipidemia   . GERD (gastroesophageal reflux disease)   . History of post-polio syndrome   . H/O acute alcoholic hepatitis   . Cervical myelopathy     Hx of  . Right patella fracture   . Right sided weakness   . Iron deficiency anemia     Past Surgical History  Procedure Date  . Cervical fusion  04/16/2003  . Orif right femur fracture   . Radioactive iodine treatment 1984  . Esophagogastroduodenoscopy 07/28/2011    Procedure: ESOPHAGOGASTRODUODENOSCOPY (EGD);  Surgeon: Louis Meckel, MD;  Location: [facility];  Service: Endoscopy;  Laterality: N/A;    Family History  Problem Relation Age of Onset  . Diabetes Mother   . Diabetes Father   . Diabetes Brother    Social History:  reports that he quit smoking about 38 years ago. He has never used smokeless tobacco. He reports that he does not drink alcohol. His drug history not on file.  Allergies: No Known Allergies  Medications Prior to Admission  Medication Dose Route Frequency Provider Last Rate Last Dose  . morphine 4 MG/ML injection 4 mg  4 mg Intravenous Once Pricilla Loveless, MD   4 mg at 09/09/11 1856  . ondansetron (ZOFRAN) injection 4 mg  4 mg Intravenous Once Pricilla Loveless, MD   4 mg at 09/09/11 1856  . potassium chloride SA (K-DUR,KLOR-CON) CR tablet 40 mEq  40 mEq Oral Once Pricilla Loveless, MD   40 mEq at 09/09/11 1856  . sodium chloride 0.9 % bolus 1,000 mL  1,000 mL Intravenous Once Pricilla Loveless, MD   1,000 mL at 09/09/11 1906   Medications Prior to Admission  Medication Sig Dispense Refill  . benazepril (LOTENSIN) 40 MG tablet Take 40 mg by mouth daily.       Marland Kitchen dextran 70-hypromellose (TEARS RENEWED) ophthalmic  solution Place 1 drop into both eyes 3 (three) times daily as needed. For dry eyes      . hydrochlorothiazide (HYDRODIURIL) 25 MG tablet Take 25 mg by mouth daily.      . insulin glargine (LANTUS) 100 UNIT/ML injection Inject 20-30 Units into the skin at bedtime. Per sliding scale      . metFORMIN (GLUCOPHAGE) 500 MG tablet Take 1,000 mg by mouth 2 (two) times daily with a meal.      . metoCLOPramide (REGLAN) 10 MG tablet Take 10 mg by mouth every 6 (six) hours as needed. For nausea      . Multiple Vitamin (MULITIVITAMIN WITH MINERALS) TABS Take 1 tablet by mouth daily.      Marland Kitchen omeprazole (PRILOSEC) 40 MG  capsule Take 40 mg by mouth 2 (two) times daily.      . simvastatin (ZOCOR) 40 MG tablet Take 40 mg by mouth every evening.      Marland Kitchen DISCONTD: hyoscyamine (LEVBID) 0.375 MG 12 hr tablet Take 1 tablet (0.375 mg total) by mouth every 12 (twelve) hours.  60 tablet  0  . DISCONTD: metoCLOPramide (REGLAN) 10 MG tablet Take 1 tablet (10 mg total) by mouth every 6 (six) hours as needed (nausea).  30 tablet  0    Results for orders placed during the hospital encounter of 09/09/11 (from the past 48 hour(s))  CBC     Status: Normal   Collection Time   09/09/11  5:50 PM      Component Value Range Comment   WBC 6.2  4.0 - 10.5 (K/uL)    RBC 5.22  4.22 - 5.81 (MIL/uL)    Hemoglobin 14.4  13.0 - 17.0 (g/dL)    HCT 16.1  09.6 - 04.5 (%)    MCV 81.2  78.0 - 100.0 (fL)    MCH 27.6  26.0 - 34.0 (pg)    MCHC 34.0  30.0 - 36.0 (g/dL)    RDW 40.9  81.1 - 91.4 (%)    Platelets 325  150 - 400 (K/uL)   DIFFERENTIAL     Status: Normal   Collection Time   09/09/11  5:50 PM      Component Value Range Comment   Neutrophils Relative 71  43 - 77 (%)    Neutro Abs 4.4  1.7 - 7.7 (K/uL)    Lymphocytes Relative 18  12 - 46 (%)    Lymphs Abs 1.1  0.7 - 4.0 (K/uL)    Monocytes Relative 11  3 - 12 (%)    Monocytes Absolute 0.7  0.1 - 1.0 (K/uL)    Eosinophils Relative 1  0 - 5 (%)    Eosinophils Absolute 0.1  0.0 - 0.7 (K/uL)    Basophils Relative 0  0 - 1 (%)    Basophils Absolute 0.0  0.0 - 0.1 (K/uL)   COMPREHENSIVE METABOLIC PANEL     Status: Abnormal   Collection Time   09/09/11  5:50 PM      Component Value Range Comment   Sodium 128 (*) 135 - 145 (mEq/L)    Potassium 3.1 (*) 3.5 - 5.1 (mEq/L)    Chloride 87 (*) 96 - 112 (mEq/L)    CO2 29  19 - 32 (mEq/L)    Glucose, Bld 207 (*) 70 - 99 (mg/dL)    BUN 5 (*) 6 - 23 (mg/dL)    Creatinine, Ser 7.82 (*) 0.50 - 1.35 (mg/dL)    Calcium 9.6  8.4 - 10.5 (  mg/dL)    Total Protein 7.6  6.0 - 8.3 (g/dL)    Albumin 3.5  3.5 - 5.2 (g/dL)    AST 16  0 - 37 (U/L)    ALT  11  0 - 53 (U/L)    Alkaline Phosphatase 72  39 - 117 (U/L)    Total Bilirubin 0.5  0.3 - 1.2 (mg/dL)    GFR calc non Af Amer >90  >90 (mL/min)    GFR calc Af Amer >90  >90 (mL/min)   LIPASE, BLOOD     Status: Normal   Collection Time   09/09/11  5:50 PM      Component Value Range Comment   Lipase 39  11 - 59 (U/L)   URINALYSIS, ROUTINE W REFLEX MICROSCOPIC     Status: Abnormal   Collection Time   09/09/11  5:52 PM      Component Value Range Comment   Color, Urine YELLOW  YELLOW     APPearance CLEAR  CLEAR     Specific Gravity, Urine 1.017  1.005 - 1.030     pH 7.0  5.0 - 8.0     Glucose, UA NEGATIVE  NEGATIVE (mg/dL)    Hgb urine dipstick NEGATIVE  NEGATIVE     Bilirubin Urine SMALL (*) NEGATIVE     Ketones, ur >80 (*) NEGATIVE (mg/dL)    Protein, ur NEGATIVE  NEGATIVE (mg/dL)    Urobilinogen, UA 1.0  0.0 - 1.0 (mg/dL)    Nitrite NEGATIVE  NEGATIVE     Leukocytes, UA NEGATIVE  NEGATIVE  MICROSCOPIC NOT DONE ON URINES WITH NEGATIVE PROTEIN, BLOOD, LEUKOCYTES, NITRITE, OR GLUCOSE <1000 mg/dL.  OCCULT BLOOD, POC DEVICE     Status: Normal   Collection Time   09/09/11  6:01 PM      Component Value Range Comment   Fecal Occult Bld NEGATIVE       RADIOLOGICAL STUDIES: I have personally reviewed the radiological exams myself  Dg Abd Acute W/chest  09/09/2011  *RADIOLOGY REPORT*  Clinical Data: Left sided abdominal pain for 3 weeks, mid chest pain, cough, former smoking history  ACUTE ABDOMEN SERIES (ABDOMEN 2 VIEW & CHEST 1 VIEW)  Comparison: CT abdomen pelvis of 09/05/2011  Findings: Mild under aeration of the lung bases is noted.  There is cardiomegaly present.  No infiltrate or effusion is seen.  Supine and left lateral decubitus films of the abdomen show no bowel obstruction.  No free air is seen.  Some residual contrast is noted scattered throughout the nondistended colon.  There are diffuse degenerative changes throughout the lumbar spine.  IMPRESSION:  1.  Cardiomegaly.  Suboptimal  inspiration with mild basilar volume loss. 2.  No bowel obstruction.  No free air.  Original Report Authenticated By: Juline Patch, M.D.   ABDOMINAL U/S EGD 07/28/11 CT ABD/PELVIS 09/2011   Review of Systems  Constitutional: Positive for chills. Negative for fever, weight loss, malaise/fatigue and diaphoresis.  HENT: Negative for hearing loss and congestion.   Eyes: Negative for double vision.       +glasses   Respiratory: Negative for cough, sputum production, shortness of breath and wheezing.   Cardiovascular: Positive for leg swelling. Negative for chest pain, palpitations, orthopnea and PND.  Gastrointestinal:       See hpi  Genitourinary: Negative for dysuria and urgency.       +nocturia, weak stream  Musculoskeletal: Positive for joint pain (chronic).       Post-polio syndrome- has b/l LE  weakness (Rt>Lt weak); uses a walker  Skin: Negative for itching and rash.  Neurological: Negative for dizziness, focal weakness, seizures, loss of consciousness and headaches.       Denies amaurosis fugax, TIA  Endo/Heme/Allergies:       DM since 1995  Psychiatric/Behavioral: Negative for suicidal ideas.    Blood pressure 155/92, pulse 80, temperature 97.9 F (36.6 C), temperature source Oral, resp. rate 18, SpO2 96.00%. Physical Exam  Vitals reviewed. Constitutional: He is oriented to person, place, and time. He appears well-developed and well-nourished. He is cooperative.  Non-toxic appearance. He does not have a sickly appearance. He does not appear ill. No distress.       Morbidly obese  HENT:  Head: Normocephalic and atraumatic.  Right Ear: External ear normal.  Left Ear: External ear normal.  Eyes: Conjunctivae are normal. Right eye exhibits no discharge. Left eye exhibits no discharge. No scleral icterus.  Neck: Normal range of motion. Neck supple. No tracheal deviation present.    Cardiovascular: Normal rate, regular rhythm and normal heart sounds.   Pulses:      Radial  pulses are 2+ on the right side, and 2+ on the left side.       Femoral pulses are 2+ on the right side, and 2+ on the left side. Respiratory: Effort normal and breath sounds normal. No respiratory distress. He has no wheezes.  GI: Soft. Bowel sounds are normal. He exhibits no distension. There is tenderness (epigastric and LUQ; more epigastric). There is no rebound and no guarding.       Obese, large abdomen;  Musculoskeletal: He exhibits edema (1+edema LLE).       B/l LE - ext rotated. B/l LE weakness. L is stronger than RLE.  Neurological: He is alert and oriented to person, place, and time. He exhibits abnormal muscle tone (RLE).  Skin: Skin is warm and dry. No rash noted. No erythema.  Psychiatric: He has a normal mood and affect. His behavior is normal. Judgment and thought content normal.     Assessment/Plan Patient Active Problem List  Diagnoses  . Hypertension  . DM (diabetes mellitus), type 2, uncontrolled  . Hyperlipidemia  . GERD (gastroesophageal reflux disease)  . Hyponatremia  . Generalized weakness  . History of post-polio syndrome  . Dehydration  . Symptomatic cholelithiasis, possible chronic cholecystitis   - hypokalemia -constipation   This is the patient's third trip to the emergency department within the past 2 weeks. I believe he has failed outpatient management for his symptomatic cholelithiasis. I believe his symptoms are attributable to his gallbladder. However his symptoms are a little atypical for classic gallbladder disease. His abdominal pain could also be due to gastroparesis.   Since he has had an upper endoscopy which was normal, I am more willing to offer him a laparoscopic cholecystectomy. We discussed gallbladder disease.  We discussed non-operative and operative management.   I discussed laparoscopic cholecystectomy with ioc in detail.  The patient was given diagrams detailing the procedure.  We discussed the risks and benefits of a laparoscopic  cholecystectomy including, but not limited to bleeding, infection, injury to surrounding structures such as the intestine or liver, bile leak, retained gallstones, need to convert to an open procedure, prolonged diarrhea, blood clots such as  DVT, common bile duct injury, anesthesia risks, and possible need for additional procedures.  We discussed the typical post-operative recovery course. I explained that the likelihood of improvement of their symptoms is fair to good.  Admit pt for pain control. Hypokalemia - replace potassium Hyponatremia - should correct with IVF DM - will place on SSI VTE prophylaxis - scds, lovenox HTN- cont home BP meds Symptomatic cholelithiasis vs chronic cholecystitis - NPO p MN,  Possible OR 4/6; explained to pt that there is no indication his GB is infected; therefore, if a 'sicker' pt comes in, we operate on the sickest to the least sickest. Constipation - enema and laxative  Instructed pt that he should stop taking BC powders daily as these are ulcerogenic.  Mary Sella. Andrey Campanile, MD, FACS General, Bariatric, & Minimally Invasive Surgery [facility], [place]   [facility] M 09/09/2011, 8:52 PM

## 2011-09-10 LAB — CBC
HCT: 39.3 % (ref 39.0–52.0)
Hemoglobin: 13.1 g/dL (ref 13.0–17.0)
MCH: 27.5 pg (ref 26.0–34.0)
MCV: 82.6 fL (ref 78.0–100.0)
RBC: 4.76 MIL/uL (ref 4.22–5.81)

## 2011-09-10 LAB — COMPREHENSIVE METABOLIC PANEL
AST: 15 U/L (ref 0–37)
Albumin: 3.1 g/dL — ABNORMAL LOW (ref 3.5–5.2)
Calcium: 8.9 mg/dL (ref 8.4–10.5)
Chloride: 95 mEq/L — ABNORMAL LOW (ref 96–112)
Creatinine, Ser: 0.42 mg/dL — ABNORMAL LOW (ref 0.50–1.35)
Total Protein: 6.7 g/dL (ref 6.0–8.3)

## 2011-09-10 LAB — GLUCOSE, CAPILLARY

## 2011-09-10 LAB — SURGICAL PCR SCREEN: Staphylococcus aureus: POSITIVE — AB

## 2011-09-10 LAB — HEMOGLOBIN A1C
Hgb A1c MFr Bld: 9 % — ABNORMAL HIGH (ref <5.7)
Mean Plasma Glucose: 212 mg/dL — ABNORMAL HIGH (ref <117)

## 2011-09-10 MED ORDER — CHLORHEXIDINE GLUCONATE CLOTH 2 % EX PADS
6.0000 | MEDICATED_PAD | Freq: Every day | CUTANEOUS | Status: DC
  Administered 2011-09-10: 6 via TOPICAL

## 2011-09-10 MED ORDER — MUPIROCIN 2 % EX OINT
1.0000 "application " | TOPICAL_OINTMENT | Freq: Two times a day (BID) | CUTANEOUS | Status: AC
  Administered 2011-09-10 – 2011-09-14 (×10): 1 via NASAL
  Filled 2011-09-10: qty 22

## 2011-09-10 MED ORDER — WHITE PETROLATUM GEL
Status: AC
  Filled 2011-09-10: qty 5

## 2011-09-10 NOTE — Progress Notes (Signed)
Subjective: Pain improved.  No nausea.  Objective: Vital signs in last 24 hours: Temp:  [97.9 F (36.6 C)-98.9 F (37.2 C)] 98.7 F (37.1 C) (04/06 0706) Pulse Rate:  [72-88] 78  (04/06 0706) Resp:  [18-22] 20  (04/06 0706) BP: (155-185)/(85-92) 160/85 mmHg (04/06 0706) SpO2:  [96 %-99 %] 97 % (04/06 0706) Weight:  [261 lb (118.389 kg)] 261 lb (118.389 kg) (04/05 2320) Last BM Date: 09/10/11  Intake/Output from previous day: 04/05 0701 - 04/06 0700 In: 2000 [I.V.:2000] Out: 525 [Urine:525] Intake/Output this shift:    General appearance: alert, cooperative and no distress GI: Morbidly obese.  Mild epigastric tenderness  Lab Results:   Basename 09/09/11 1750  WBC 6.2  HGB 14.4  HCT 42.4  PLT 325   BMET  Basename 09/09/11 1750  NA 128*  K 3.1*  CL 87*  CO2 29  GLUCOSE 207*  BUN 5*  CREATININE 0.40*  CALCIUM 9.6   PT/INR No results found for this basename: LABPROT:2,INR:2 in the last 72 hours ABG No results found for this basename: PHART:2,PCO2:2,PO2:2,HCO3:2 in the last 72 hours  Studies/Results: Dg Abd Acute W/chest  09/09/2011  *RADIOLOGY REPORT*  Clinical Data: Left sided abdominal pain for 3 weeks, mid chest pain, cough, former smoking history  ACUTE ABDOMEN SERIES (ABDOMEN 2 VIEW & CHEST 1 VIEW)  Comparison: CT abdomen pelvis of 09/05/2011  Findings: Mild under aeration of the lung bases is noted.  There is cardiomegaly present.  No infiltrate or effusion is seen.  Supine and left lateral decubitus films of the abdomen show no bowel obstruction.  No free air is seen.  Some residual contrast is noted scattered throughout the nondistended colon.  There are diffuse degenerative changes throughout the lumbar spine.  IMPRESSION:  1.  Cardiomegaly.  Suboptimal inspiration with mild basilar volume loss. 2.  No bowel obstruction.  No free air.  Original Report Authenticated By: Juline Patch, M.D.    Anti-infectives: Anti-infectives    None       Assessment/Plan: s/p Procedure(s) (LRB): LAPAROSCOPIC CHOLECYSTECTOMY WITH INTRAOPERATIVE CHOLANGIOGRAM (N/A) Will delay lap chole until tomorrow.   Will let eat today.  LOS: 1 day    [facility] 09/10/2011

## 2011-09-10 NOTE — Progress Notes (Signed)
Patient adamently refusing Novolog sliding scale injections.  Patient states that he told MD in ER that he did not take Novolog prior to admission.  Explained to patient the need for Novolog insulin coverage to keep blood sugars under control.  Patient still refusing injections.

## 2011-09-10 NOTE — Anesthesia Preprocedure Evaluation (Addendum)
Anesthesia Evaluation  Patient identified by MRN, date of birth, ID band Patient awake    Reviewed: Allergy & Precautions, H&P , NPO status , Patient's Chart, lab work & pertinent test results, reviewed documented beta blocker date and time   History of Anesthesia Complications Negative for: history of anesthetic complications  Airway Mallampati: II TM Distance: >3 FB Neck ROM: Limited    Dental  (+) Edentulous Upper, Chipped, Poor Dentition and Dental Advisory Given   Pulmonary neg pulmonary ROS,    Pulmonary exam normal       Cardiovascular Exercise Tolerance: Poor hypertension, On Medications, Pt. on medications and Pt. 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 negative psych ROS   GI/Hepatic negative GI ROS, GERD-  Medicated and Controlled,(+) Cirrhosis -    substance abuse   , Hepatitis - (alcholic hepatitis), Toxin Related  Endo/Other  Diabetes mellitus-, Poorly Controlled, Insulin Dependent and Oral Hypoglycemic AgentsMorbid obesity  Renal/GU negative Renal ROS  negative genitourinary   Musculoskeletal  (+) Arthritis -, Osteoarthritis,    Abdominal (+) + obese,   Peds  Hematology negative hematology ROS (+)   Anesthesia Other Findings See surgeon's H&P   Reproductive/Obstetrics negative OB ROS                         Anesthesia Physical Anesthesia Plan  ASA: IV  Anesthesia Plan: General   Post-op Pain Management:    Induction: Intravenous  Airway Management Planned: Oral ETT and Video Laryngoscope Planned  Additional Equipment:   Intra-op Plan:   Post-operative Plan: Extubation in OR  Informed Consent: I have reviewed the patients History and Physical, chart, labs and discussed the procedure including the risks, benefits and alternatives for the proposed anesthesia with the patient or  authorized representative who has indicated his/her understanding and acceptance.     Plan Discussed with: CRNA and Surgeon  Anesthesia Plan Comments:        Anesthesia Quick Evaluation

## 2011-09-11 ENCOUNTER — Encounter (HOSPITAL_COMMUNITY): Payer: Self-pay | Admitting: Anesthesiology

## 2011-09-11 ENCOUNTER — Inpatient Hospital Stay (HOSPITAL_COMMUNITY): Payer: Medicare Other

## 2011-09-11 ENCOUNTER — Encounter (HOSPITAL_COMMUNITY): Admission: EM | Disposition: A | Discharge status: Skilled Nursing Facility | Payer: Self-pay | Source: Home / Self Care

## 2011-09-11 ENCOUNTER — Inpatient Hospital Stay (HOSPITAL_COMMUNITY): Payer: Medicare Other | Admitting: Anesthesiology

## 2011-09-11 HISTORY — PX: CHOLECYSTECTOMY: SHX55

## 2011-09-11 LAB — GLUCOSE, CAPILLARY
Glucose-Capillary: 221 mg/dL — ABNORMAL HIGH (ref 70–99)
Glucose-Capillary: 206 mg/dL — ABNORMAL HIGH (ref 70–99)
Glucose-Capillary: 249 mg/dL — ABNORMAL HIGH (ref 70–99)

## 2011-09-11 SURGERY — LAPAROSCOPIC CHOLECYSTECTOMY WITH INTRAOPERATIVE CHOLANGIOGRAM
Anesthesia: General | Site: Abdomen | Wound class: Clean Contaminated

## 2011-09-11 MED ORDER — ROCURONIUM BROMIDE 100 MG/10ML IV SOLN
INTRAVENOUS | Status: DC | PRN
  Administered 2011-09-11: 50 mg via INTRAVENOUS

## 2011-09-11 MED ORDER — GLYCOPYRROLATE 0.2 MG/ML IJ SOLN
INTRAMUSCULAR | Status: DC | PRN
  Administered 2011-09-11: 0.4 mg via INTRAVENOUS

## 2011-09-11 MED ORDER — LACTATED RINGERS IV SOLN
INTRAVENOUS | Status: DC | PRN
  Administered 2011-09-11 (×2): via INTRAVENOUS

## 2011-09-11 MED ORDER — METFORMIN HCL 500 MG PO TABS
1000.0000 mg | ORAL_TABLET | Freq: Two times a day (BID) | ORAL | Status: DC
  Administered 2011-09-11 – 2011-09-15 (×8): 1000 mg via ORAL
  Filled 2011-09-11 (×10): qty 2

## 2011-09-11 MED ORDER — ONDANSETRON HCL 4 MG/2ML IJ SOLN
INTRAMUSCULAR | Status: DC | PRN
  Administered 2011-09-11: 4 mg via INTRAVENOUS

## 2011-09-11 MED ORDER — DEXTROSE 5 % IV SOLN
1.0000 g | INTRAVENOUS | Status: DC | PRN
  Administered 2011-09-11: 2 g via INTRAVENOUS

## 2011-09-11 MED ORDER — HYDROCODONE-ACETAMINOPHEN 5-325 MG PO TABS
1.0000 | ORAL_TABLET | ORAL | Status: DC | PRN
  Administered 2011-09-11 – 2011-09-15 (×14): 2 via ORAL
  Filled 2011-09-11 (×14): qty 2

## 2011-09-11 MED ORDER — METOCLOPRAMIDE HCL 5 MG/ML IJ SOLN
INTRAMUSCULAR | Status: DC | PRN
  Administered 2011-09-11: 10 mg via INTRAVENOUS

## 2011-09-11 MED ORDER — BUPIVACAINE-EPINEPHRINE 0.25% -1:200000 IJ SOLN
INTRAMUSCULAR | Status: DC | PRN
  Administered 2011-09-11: 15 mL

## 2011-09-11 MED ORDER — FENTANYL CITRATE 0.05 MG/ML IJ SOLN
INTRAMUSCULAR | Status: DC | PRN
  Administered 2011-09-11 (×2): 50 ug via INTRAVENOUS
  Administered 2011-09-11: 100 ug via INTRAVENOUS

## 2011-09-11 MED ORDER — NEOSTIGMINE METHYLSULFATE 1 MG/ML IJ SOLN
INTRAMUSCULAR | Status: DC | PRN
  Administered 2011-09-11: 3 mg via INTRAVENOUS

## 2011-09-11 MED ORDER — DEXTROSE 5 % IV SOLN
INTRAVENOUS | Status: AC
  Filled 2011-09-11 (×2): qty 1

## 2011-09-11 MED ORDER — PHENOL 1.4 % MT LIQD: 1.0000 | OROMUCOSAL | Status: DC | PRN

## 2011-09-11 MED ORDER — SODIUM CHLORIDE 0.9 % IR SOLN
Status: DC | PRN
  Administered 2011-09-11: 1000 mL

## 2011-09-11 MED ORDER — MENTHOL 3 MG MT LOZG: 1.0000 | LOZENGE | OROMUCOSAL | Status: DC | PRN

## 2011-09-11 MED ORDER — PROPOFOL 10 MG/ML IV EMUL
INTRAVENOUS | Status: DC | PRN
  Administered 2011-09-11: 200 mg via INTRAVENOUS

## 2011-09-11 MED ORDER — LIDOCAINE HCL (CARDIAC) 20 MG/ML IV SOLN
INTRAVENOUS | Status: DC | PRN
  Administered 2011-09-11: 60 mg via INTRAVENOUS

## 2011-09-11 MED ORDER — PHENYLEPHRINE HCL 10 MG/ML IJ SOLN
INTRAMUSCULAR | Status: DC | PRN
  Administered 2011-09-11 (×4): 80 ug via INTRAVENOUS

## 2011-09-11 MED ORDER — ENOXAPARIN SODIUM 40 MG/0.4ML ~~LOC~~ SOLN
40.0000 mg | SUBCUTANEOUS | Status: DC
  Administered 2011-09-11 – 2011-09-14 (×4): 40 mg via SUBCUTANEOUS
  Filled 2011-09-11 (×5): qty 0.4

## 2011-09-11 SURGICAL SUPPLY — 47 items
ADH SKN CLS APL DERMABOND .7 (GAUZE/BANDAGES/DRESSINGS) ×1
APL SKNCLS STERI-STRIP NONHPOA (GAUZE/BANDAGES/DRESSINGS) ×1
APPLIER CLIP 5 13 M/L LIGAMAX5 (MISCELLANEOUS) ×2
APR CLP MED LRG 5 ANG JAW (MISCELLANEOUS) ×1
BAG SPEC RTRVL LRG 6X4 10 (ENDOMECHANICALS) ×1
BANDAGE ADHESIVE 1X3 (GAUZE/BANDAGES/DRESSINGS) ×6 IMPLANT
BENZOIN TINCTURE PRP APPL 2/3 (GAUZE/BANDAGES/DRESSINGS) ×2 IMPLANT
BLADE SURG ROTATE 9660 (MISCELLANEOUS) IMPLANT
CANISTER SUCTION 2500CC (MISCELLANEOUS) ×2 IMPLANT
CHLORAPREP W/TINT 26ML (MISCELLANEOUS) ×2 IMPLANT
CLIP APPLIE 5 13 M/L LIGAMAX5 (MISCELLANEOUS) ×1 IMPLANT
CLOTH BEACON ORANGE TIMEOUT ST (SAFETY) ×2 IMPLANT
COVER MAYO STAND STRL (DRAPES) ×1 IMPLANT
COVER SURGICAL LIGHT HANDLE (MISCELLANEOUS) ×2 IMPLANT
DECANTER SPIKE VIAL GLASS SM (MISCELLANEOUS) ×2 IMPLANT
DERMABOND ADVANCED (GAUZE/BANDAGES/DRESSINGS) ×1
DERMABOND ADVANCED .7 DNX12 (GAUZE/BANDAGES/DRESSINGS) IMPLANT
DRAPE C-ARM 42X72 X-RAY (DRAPES) ×1 IMPLANT
DRAPE UTILITY 15X26 W/TAPE STR (DRAPE) ×4 IMPLANT
DRSG TEGADERM 4X4.75 (GAUZE/BANDAGES/DRESSINGS) ×2 IMPLANT
ELECT REM PT RETURN 9FT ADLT (ELECTROSURGICAL) ×2
ELECTRODE REM PT RTRN 9FT ADLT (ELECTROSURGICAL) ×1 IMPLANT
GAUZE SPONGE 2X2 8PLY STRL LF (GAUZE/BANDAGES/DRESSINGS) ×1 IMPLANT
GLOVE BIOGEL M STRL SZ7.5 (GLOVE) ×2 IMPLANT
GLOVE BIOGEL PI IND STRL 8 (GLOVE) ×1 IMPLANT
GLOVE BIOGEL PI INDICATOR 8 (GLOVE) ×1
GOWN STRL NON-REIN LRG LVL3 (GOWN DISPOSABLE) ×4 IMPLANT
GOWN STRL REIN XL XLG (GOWN DISPOSABLE) ×2 IMPLANT
KIT BASIN OR (CUSTOM PROCEDURE TRAY) ×2 IMPLANT
KIT ROOM TURNOVER OR (KITS) ×2 IMPLANT
NS IRRIG 1000ML POUR BTL (IV SOLUTION) ×2 IMPLANT
PAD ARMBOARD 7.5X6 YLW CONV (MISCELLANEOUS) ×4 IMPLANT
POUCH SPECIMEN RETRIEVAL 10MM (ENDOMECHANICALS) ×2 IMPLANT
SCISSORS LAP 5X35 DISP (ENDOMECHANICALS) ×1 IMPLANT
SET CHOLANGIOGRAPH 5 50 .035 (SET/KITS/TRAYS/PACK) IMPLANT
SET IRRIG TUBING LAPAROSCOPIC (IRRIGATION / IRRIGATOR) ×2 IMPLANT
SLEEVE ENDOPATH XCEL 5M (ENDOMECHANICALS) ×4 IMPLANT
SPECIMEN JAR SMALL (MISCELLANEOUS) ×2 IMPLANT
SPONGE GAUZE 2X2 STER 10/PKG (GAUZE/BANDAGES/DRESSINGS) ×1
SUT MNCRL AB 4-0 PS2 18 (SUTURE) ×2 IMPLANT
SUT VICRYL 0 UR6 27IN ABS (SUTURE) IMPLANT
TOWEL OR 17X24 6PK STRL BLUE (TOWEL DISPOSABLE) ×2 IMPLANT
TOWEL OR 17X26 10 PK STRL BLUE (TOWEL DISPOSABLE) ×2 IMPLANT
TRAY LAPAROSCOPIC (CUSTOM PROCEDURE TRAY) ×2 IMPLANT
TROCAR XCEL BLUNT TIP 100MML (ENDOMECHANICALS) ×2 IMPLANT
TROCAR XCEL NON-BLD 5MMX100MML (ENDOMECHANICALS) ×2 IMPLANT
WATER STERILE IRR 1000ML POUR (IV SOLUTION) IMPLANT

## 2011-09-11 NOTE — Transfer of Care (Signed)
Immediate Anesthesia Transfer of Care Note  Patient: Timothy Gross  Procedure(s) Performed: Procedure(s) (LRB): LAPAROSCOPIC CHOLECYSTECTOMY WITH INTRAOPERATIVE CHOLANGIOGRAM (N/A)  Patient Location: PACU  Anesthesia Type: General  Level of Consciousness: awake, alert  and oriented  Airway & Oxygen Therapy: Patient Spontanous Breathing and Patient connected to nasal cannula oxygen  Post-op Assessment: Report given to PACU RN, Post -op Vital signs reviewed and stable and Patient moving all extremities  Post vital signs: Reviewed and stable  Complications: No apparent anesthesia complications

## 2011-09-11 NOTE — Anesthesia Postprocedure Evaluation (Signed)
Anesthesia Post Note  Patient: MATTIA LIFORD  Procedure(s) Performed: Procedure(s) (LRB): LAPAROSCOPIC CHOLECYSTECTOMY WITH INTRAOPERATIVE CHOLANGIOGRAM (N/A)  Anesthesia type: general  Patient location: PACU  Post pain: Pain level controlled  Post assessment: Patient's Cardiovascular Status Stable  Last Vitals:  Filed Vitals:   09/11/11 1007  BP:   Pulse: 68  Temp:   Resp: 10    Post vital signs: Reviewed and stable  Level of consciousness: sedated  Complications: No apparent anesthesia complications  Anesthesia Post Note  Patient: Charissa Bash  Procedure(s) Performed: Procedure(s) (LRB): LAPAROSCOPIC CHOLECYSTECTOMY WITH INTRAOPERATIVE CHOLANGIOGRAM (N/A)  Anesthesia type: general  Patient location: PACU  Post pain: Pain level controlled  Post assessment: Patient's Cardiovascular Status Stable  Last Vitals:  Filed Vitals:   09/11/11 1007  BP:   Pulse: 68  Temp:   Resp: 10    Post vital signs: Reviewed and stable  Level of consciousness: sedated  Complications: No apparent anesthesia complications Neuro exam c/w pre-op weakness.

## 2011-09-11 NOTE — Anesthesia Procedure Notes (Signed)
Procedure Name: Intubation Date/Time: 09/11/2011 7:46 AM Performed by: Fuller Canada Pre-anesthesia Checklist: Patient identified, Timeout performed, Emergency Drugs available, Suction available and Patient being monitored Patient Re-evaluated:Patient Re-evaluated prior to inductionOxygen Delivery Method: Circle system utilized Preoxygenation: Pre-oxygenation with 100% oxygen Intubation Type: IV induction Ventilation: Mask ventilation with difficulty, Two handed mask ventilation required and Oral airway inserted - appropriate to patient size Grade View: Grade I Tube size: 7.5 mm Number of attempts: 1 Airway Equipment and Method: Rigid stylet and Video-laryngoscopy Placement Confirmation: ETT inserted through vocal cords under direct vision,  positive ETCO2 and breath sounds checked- equal and bilateral Secured at: 23 cm Tube secured with: Tape Dental Injury: Teeth and Oropharynx as per pre-operative assessment

## 2011-09-11 NOTE — Op Note (Signed)
Laparoscopic Cholecystectomy with IOC Procedure Note  Indications: This patient presents with symptomatic gallbladder disease and will undergo laparoscopic cholecystectomy.  Pre-operative Diagnosis: Calculus of gallbladder with other cholecystitis, without mention of obstruction  Post-operative Diagnosis: Same  Surgeon: Atilano Ina   Assistants: none  Anesthesia: General endotracheal anesthesia and Local anesthesia 0.25.% bupivacaine, with epinephrine  ASA Class: 4  Procedure Details  The patient was seen again in the Holding Room. The risks, benefits, complications, treatment options, and expected outcomes were discussed with the patient. The possibilities of reaction to medication, pulmonary aspiration, perforation of viscus, bleeding, recurrent infection, finding a normal gallbladder, the need for additional procedures, failure to diagnose a condition, the possible need to convert to an open procedure, and creating a complication requiring transfusion or operation were discussed with the patient. The likelihood of improving the patient's symptoms with return to their baseline status is good.  The patient and/or family concurred with the proposed plan, giving informed consent. The site of surgery properly noted. The patient was taken to Operating Room, identified as Timothy Gross and the procedure verified as Laparoscopic Cholecystectomy with Intraoperative Cholangiogram. A Time Out was held and the above information confirmed.  Prior to the induction of general anesthesia, antibiotic prophylaxis was administered. General endotracheal anesthesia was then administered and tolerated well. After the induction, the abdomen was prepped with Chloraprep and draped in the sterile fashion. The patient was positioned in the supine position.  Local anesthetic agent was injected into the skin near the umbilicus and an incision made. We dissected down to the abdominal fascia with blunt dissection.   The fascia was incised vertically and we entered the peritoneal cavity bluntly.  A pursestring suture of 0-Vicryl was placed around the fascial opening.  The Hasson cannula was inserted and secured with the stay suture.  Pneumoperitoneum was then created with CO2 and tolerated well without any adverse changes in the patient's vital signs. An 5-mm port was placed in the subxiphoid position.  Two 5-mm ports were placed in the right upper quadrant. All skin incisions were infiltrated with a local anesthetic agent before making the incision and placing the trocars.   We positioned the patient in reverse Trendelenburg, tilted slightly to the patient's left.  The gallbladder was identified, the fundus grasped and retracted cephalad. Adhesions were lysed bluntly and with the electrocautery where indicated, taking care not to injure any adjacent organs or viscus. The infundibulum was grasped and retracted laterally, exposing the peritoneum overlying the triangle of Calot. The patient had a fatty liver. There was a fair amount of fat surrounding the gallbladder. This was then divided and exposed in a blunt fashion. A critical view of the cystic duct and cystic artery was obtained.  The cystic duct was clearly identified and bluntly dissected circumferentially. The cystic duct was ligated with a clip distally.   An incision was made in the cystic duct and the [facility] cholangiogram catheter introduced. The catheter was secured using a clip. A cholangiogram was then obtained which showed good visualization of the distal and proximal biliary tree with no sign of filling defects or obstruction.  Contrast flowed easily into the duodenum. The catheter was then removed.   The cystic duct was then ligated with clips and divided. The cystic artery was identified, dissected free, ligated with clips and divided as well.   The gallbladder was dissected from the liver bed in retrograde fashion with the electrocautery. The gallbladder  was removed and placed in an Endocatch sac.  The gallbladder and Endocatch sac were then removed through the umbilical port site. The liver bed was irrigated and inspected. Hemostasis was achieved with the electrocautery. A piece of snow was placed in the gallbladder fossa. Copious irrigation was utilized and was repeatedly aspirated until clear.  The pursestring suture was used to close the umbilical fascia.    We again inspected the right upper quadrant for hemostasis.  The umbilical closure was inspected and there was no air leak and nothing trapped within the closure. I placed an additional 0-vicryl suture for additional support given his morbid obesity. Pneumoperitoneum was released as we removed the trocars.  4-0 Monocryl was used to close the skin.   Benzoin, steri-strips, and clean dressings were applied. The patient was then extubated and brought to the recovery room in stable condition. Instrument, sponge, and needle counts were correct at closure and at the conclusion of the case.   Findings: Chronic Cholecystitis with Cholelithiasis  Estimated Blood Loss: Minimal         Drains: none         Specimens: Gallbladder           Complications: None; patient tolerated the procedure well.         Disposition: PACU - hemodynamically stable.         Condition: stable  Mary Sella. Andrey Campanile, MD, FACS General, Bariatric, & Minimally Invasive Surgery [facility], [place]

## 2011-09-11 NOTE — Interval H&P Note (Signed)
History and Physical Interval Note:  09/11/2011 7:34 AM  Timothy Gross  has presented today for surgery, with the diagnosis of Cholecystitis  The various methods of treatment have been discussed with the patient and family. After consideration of risks, benefits and other options for treatment, the patient has consented to  Procedure(s) (LRB): LAPAROSCOPIC CHOLECYSTECTOMY WITH INTRAOPERATIVE CHOLANGIOGRAM (N/A) as a surgical intervention .  The patients' history has been reviewed, patient examined, no change in status, stable for surgery.  I have reviewed the patients' chart and labs.  Questions were answered to the patient's satisfaction.    Mary Sella. Andrey Campanile, MD, FACS General, Bariatric, & Minimally Invasive Surgery [facility], [place] Marland Kitchen  Atilano Ina

## 2011-09-11 NOTE — Preoperative (Signed)
Beta Blockers   Reason not to administer Beta Blockers:Not Applicable 

## 2011-09-12 ENCOUNTER — Encounter (HOSPITAL_COMMUNITY): Payer: Self-pay | Admitting: General Surgery

## 2011-09-12 DIAGNOSIS — R5381 Other malaise: Secondary | ICD-10-CM

## 2011-09-12 LAB — GLUCOSE, CAPILLARY
Glucose-Capillary: 140 mg/dL — ABNORMAL HIGH (ref 70–99)
Glucose-Capillary: 184 mg/dL — ABNORMAL HIGH (ref 70–99)
Glucose-Capillary: 160 mg/dL — ABNORMAL HIGH (ref 70–99)

## 2011-09-12 MED ORDER — PANTOPRAZOLE SODIUM 40 MG PO TBEC
40.0000 mg | DELAYED_RELEASE_TABLET | Freq: Every day | ORAL | Status: DC
  Administered 2011-09-12 – 2011-09-14 (×3): 40 mg via ORAL
  Filled 2011-09-12 (×3): qty 1

## 2011-09-12 NOTE — Progress Notes (Signed)
Patient ID: Timothy Gross, male   DOB: 15-Apr-1941, 71 y.o.   MRN: [id number] 1 Day Post-Op  Subjective: Pt feeling weak.  Tolerating regular diet.  Taking po pain meds without difficulties.  Objective: Vital signs in last 24 hours: Temp:  [97 F (36.1 C)-98.7 F (37.1 C)] 98.6 F (37 C) (04/08 0604) Pulse Rate:  [67-102] 101  (04/08 0604) Resp:  [9-25] 20  (04/08 0604) BP: (109-154)/(52-73) 139/63 mmHg (04/08 0604) SpO2:  [95 %-100 %] 98 % (04/08 0604) Last BM Date: 09/10/11  Intake/Output from previous day: 04/07 0701 - 04/08 0700 In: 2400 [P.O.:600; I.V.:1800] Out: 1525 [Urine:1525] Intake/Output this shift:    PE: Abd: soft, mildly tender, +BS, ND, incisions c/d/i  Lab Results:   Basename 09/10/11 0910 09/09/11 1750  WBC 5.6 6.2  HGB 13.1 14.4  HCT 39.3 42.4  PLT 313 325   BMET  Basename 09/10/11 0910 09/09/11 1750  NA 133* 128*  K 3.8 3.1*  CL 95* 87*  CO2 27 29  GLUCOSE 119* 207*  BUN 4* 5*  CREATININE 0.42* 0.40*  CALCIUM 8.9 9.6   PT/INR  Basename 09/10/11 0910  LABPROT 13.6  INR 1.02   CMP     Component Value Date/Time   NA 133* 09/10/2011 0910   K 3.8 09/10/2011 0910   CL 95* 09/10/2011 0910   CO2 27 09/10/2011 0910   GLUCOSE 119* 09/10/2011 0910   BUN 4* 09/10/2011 0910   CREATININE 0.42* 09/10/2011 0910   CALCIUM 8.9 09/10/2011 0910   PROT 6.7 09/10/2011 0910   ALBUMIN 3.1* 09/10/2011 0910   AST 15 09/10/2011 0910   ALT 11 09/10/2011 0910   ALKPHOS 62 09/10/2011 0910   BILITOT 0.5 09/10/2011 0910   GFRNONAA >90 09/10/2011 0910   GFRAA >90 09/10/2011 0910   Lipase     Component Value Date/Time   LIPASE 39 09/09/2011 1750       Studies/Results: Dg Cholangiogram Operative  09/11/2011  *RADIOLOGY REPORT*  Clinical Data:   Cholecystectomy for cholelithiasis.  INTRAOPERATIVE CHOLANGIOGRAM  Technique:  Cholangiographic images from the C-arm fluoroscopic device were submitted for interpretation post-operatively.  Please see the procedural report for the amount of  contrast and the fluoroscopy time utilized.  Comparison:  CT of the abdomen dated 09/05/2011 and ultrasound of the abdomen dated 07/28/2011.  Findings:  Intraoperative cholangiogram shows normal caliber bile ducts without filling defect or obstruction.  Contrast enters the duodenum.  Visualized intrahepatic ducts are normal.  IMPRESSION: Normal intraoperative cholangiogram.  Original Report Authenticated By: Reola Calkins, M.D.    Anti-infectives: Anti-infectives    None       Assessment/Plan  1. Cholecystitis, s/p lap chole 2. DM 3. HTN 4. Post-polio syndrome  Plan: 1. Will have PT eval patient today for home vs rehab recommendations.  Patient would like to be able to go home if able, but is aware that he feels quite a bit weaker than normal.  He lives with 57 yo brother who will be unable to provide assistance for him at home. 2. Continue regular diet. 3. Anticipate d/c once recs are made for dispo.   LOS: 3 days    Ariadna Setter E 09/12/2011

## 2011-09-12 NOTE — Evaluation (Signed)
Physical Therapy Evaluation Patient Details Name: Timothy Gross MRN: [id number] DOB: [birth year] Today's Date: 09/12/2011  Problem List:  Patient Active Problem List  Diagnoses  . Hypertension  . History of UTI  . Iron deficiency anemia  . DM (diabetes mellitus), type 2, uncontrolled  . Hyperlipidemia  . GERD (gastroesophageal reflux disease)  . Hyponatremia  . Elevated LFTs  . Abdominal pain  . Generalized weakness  . History of post-polio syndrome  . Dehydration  . Symptomatic cholelithiasis  . Constipation    Past Medical History:  Past Medical History  Diagnosis Date  . Diabetes mellitus   . Hypertension   . History of UTI   . Hyperlipidemia   . GERD (gastroesophageal reflux disease)   . History of post-polio syndrome   . H/O acute alcoholic hepatitis   . Cervical myelopathy     Hx of  . Right patella fracture   . Right sided weakness   . Iron deficiency anemia    Past Surgical History:  Past Surgical History  Procedure Date  . Cervical fusion 04/16/2003  . Orif right femur fracture   . Radioactive iodine treatment 1984  . Esophagogastroduodenoscopy 07/28/2011    Procedure: ESOPHAGOGASTRODUODENOSCOPY (EGD);  Surgeon: Louis Meckel, MD;  Location: [facility];  Service: Endoscopy;  Laterality: N/A;  . Cholecystectomy 09/11/2011    Procedure: LAPAROSCOPIC CHOLECYSTECTOMY WITH INTRAOPERATIVE CHOLANGIOGRAM;  Surgeon: Atilano Ina, MD,FACS;  Location: [facility] OR;  Service: General;  Laterality: N/A;    PT Assessment/Plan/Recommendation PT Assessment Clinical Impression Statement: Pt adm with lap cholecystectomy.  Pt with history of polio and post polio syndrome which will slow recovery of mobility.  Pt has been independent at home and is motivated to return to independence.  Recommend inpatient rehab.  Expect pt will be able to return home after short rehab stay. PT Recommendation/Assessment: Patient will need skilled PT in the acute care venue PT Problem List:  Decreased strength;Decreased mobility;Decreased balance;Decreased activity tolerance Barriers to Discharge: Decreased caregiver support Barriers to Discharge Comments: brother is 79  PT Therapy Diagnosis : Difficulty walking;Abnormality of gait;Generalized weakness PT Plan PT Frequency: Min 5X/week PT Treatment/Interventions: DME instruction;Gait training;Functional mobility training;Therapeutic activities;Therapeutic exercise;Balance training;Patient/family education PT Recommendation Recommendations for Other Services: Rehab consult Follow Up Recommendations: Inpatient Rehab Equipment Recommended: Defer to next venue PT Goals  Acute Rehab PT Goals PT Goal Formulation: With patient Time For Goal Achievement: 7 days Pt will go Supine/Side to Sit: with modified independence PT Goal: Supine/Side to Sit - Progress: Goal set today Pt will go Sit to Supine/Side: with modified independence PT Goal: Sit to Supine/Side - Progress: Goal set today Pt will go Sit to Stand: with modified independence;with upper extremity assist;from elevated surface PT Goal: Sit to Stand - Progress: Goal set today Pt will go Stand to Sit: with modified independence;with upper extremity assist PT Goal: Stand to Sit - Progress: Goal set today Pt will Ambulate: 16 - 50 feet;with modified independence;with rolling walker PT Goal: Ambulate - Progress: Goal set today  PT Evaluation Precautions/Restrictions  Precautions Precautions: Fall Prior Functioning  Home Living Lives With: Other (Comment) (brother (49 years old)) Type of Home: House Home Layout: One level Home Access: Ramped entrance Home Adaptive Equipment: Walker - rolling;Wheelchair - manual Prior Function Level of Independence: Independent with basic ADLs;Independent with transfers;Independent with homemaking with ambulation;Independent with gait;Requires assistive device for independence Driving: Yes Vocation:  Retired Financial risk analyst Arousal/Alertness: Awake/alert Overall Cognitive Status: Appears within functional limits for tasks assessed Orientation Level:  Oriented X4 Sensation/Coordination   Extremity Assessment RLE Strength RLE Overall Strength Comments: less than 3/5 LLE Strength LLE Overall Strength Comments: less than 3/5 Mobility (including Balance) Bed Mobility Bed Mobility: Yes Supine to Sit: 4: Min assist;HOB elevated (Comment degrees);With rails (HOB 45 degrees) Supine to Sit Details (indicate cue type and reason): Pt pulls up into sitting using bed rails before using hands to bring legs over edge of bed.  Needed assist to bring leg over. Sit to Supine: 4: Min assist;With rail Sit to Supine - Details (indicate cue type and reason): assist with legs Transfers Transfers: Yes Sit to Stand: 1: +2 Total assist;With upper extremity assist;From elevated surface;From bed;Patient percentage (comment) (Pt = 50%) Sit to Stand Details (indicate cue type and reason): Pt has to keep knees hyperextended out in front to get up. Stands with knees hyperextended and trunk flexed over walker.  Wide base to accomodate hyperextended knees. Stand to Sit: 1: +2 Total assist;To bed;Patient percentage (comment) (pt = 60%) Stand to Sit Details: assist to control descent Ambulation/Gait Ambulation/Gait: Yes Ambulation/Gait Assistance: 1: +2 Total assist;Patient percentage (comment) (pt=70%) Ambulation Distance (Feet): 2 Feet Assistive device: Rolling walker Gait Pattern:  (wide base, hyperextended knees, flexed at hips)  Static Standing Balance Static Standing - Balance Support: Bilateral upper extremity supported (on walker) Static Standing - Level of Assistance: 4: Min assist Exercise    End of Session PT - End of Session Equipment Utilized During Treatment:  (no gait belt due to pain in abdomen) Activity Tolerance: Patient limited by fatigue Patient left: in bed;with call bell in  reach Nurse Communication: Mobility status for transfers General Behavior During Session: [facility] for tasks performed Cognition: [facility] for tasks performed  [facility] 09/12/2011, 2:40 PM  [employer] PT [phone]

## 2011-09-12 NOTE — Progress Notes (Signed)
Inpatient Diabetes Program Recommendations  AACE/ADA: New Consensus Statement on Inpatient Glycemic Control (2009)  Target Ranges:  Prepandial:   less than 140 mg/dL      Peak postprandial:   less than 180 mg/dL (1-2 hours)      Critically ill patients:  140 - 180 mg/dL   Reason for Visit: Hyperglycemia with elevated Hgb A1C  Inpatient Diabetes Program Recommendations HgbA1C: Note that Hgb A1C is 9.0 this admission.  Previous Hgb A1C was 9.0 on 07/27/11.  Note: Being evaluated for possible Inpatient Rehab.  Persistent sub-optimal glycemic control.  If MD deems appropriate, patient willing, and patient physically able, may benefit from referral for Outpatient Diabetes Education after discharge to home.  If not physically able, may benefit from Home Health f/u short-term to address home glycemic control.  Will request nursing staff review diabetes self-care while hospitalized.

## 2011-09-12 NOTE — Consult Note (Signed)
Physical Medicine and Rehabilitation Consult Reason for Consult: Deconditioning Referring Phsyician:  CCS    HPI: Timothy Gross is an 71 y.o. male with history of DM, HTN, post polio syndrome with right sided weakness and recent issues with  symptomatic cholelithiasis.  Admitted with ED on 04/05b with LUQ and epigastric pain with nausea.  Admitted for pain management and symptom control and  underwent Lap Chole on 04/07 by Dr. Andrey Campanile.  Post op with increased weakness and problems with mobility. MD, PT recommending CIR.  Review of Systems  Eyes: Negative for blurred vision and double vision.  Respiratory: Negative for shortness of breath.   Cardiovascular: Negative for chest pain.  Gastrointestinal: Negative for nausea, vomiting and abdominal pain.  Musculoskeletal: Negative for back pain and joint pain.  Neurological: Positive for focal weakness and weakness.  All other systems reviewed and are negative.   Past Medical History  Diagnosis Date  . Diabetes mellitus   . Hypertension   . History of UTI   . Hyperlipidemia   . GERD (gastroesophageal reflux disease)   . History of post-polio syndrome     right sided weakness  . H/O acute alcoholic hepatitis   . Cervical myelopathy     Hx of  . Right patella fracture   . Right sided weakness   . Iron deficiency anemia    Past Surgical History  Procedure Date  . Cervical fusion 04/16/2003  . Orif right femur fracture   . Radioactive iodine treatment 1984  . Esophagogastroduodenoscopy 07/28/2011    Procedure: ESOPHAGOGASTRODUODENOSCOPY (EGD);  Surgeon: Louis Meckel, MD;  Location: [facility];  Service: Endoscopy;  Laterality: N/A;  . Cholecystectomy 09/11/2011    Procedure: LAPAROSCOPIC CHOLECYSTECTOMY WITH INTRAOPERATIVE CHOLANGIOGRAM;  Surgeon: Atilano Ina, MD,FACS;  Location: [facility];  Service: General;  Laterality: N/A;   Family History  Problem Relation Age of Onset  . Diabetes Mother   . Diabetes Father   . Diabetes  Brother    Social History: Lives with brother (who's 82-can provide supervision only).  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  Independent with walker for past 6 years. He reports that he quit smoking about 38 years ago. He has never used smokeless tobacco. He reports that he does not drink alcohol. His drug history not on file.   No Known Allergies  Prior to Admission medications   Medication Sig Start Date End Date Taking? Authorizing Provider  benazepril (LOTENSIN) 40 MG tablet Take 40 mg by mouth daily.    Yes Historical Provider, MD  dextran 70-hypromellose (TEARS RENEWED) ophthalmic solution Place 1 drop into both eyes 3 (three) times daily as needed. For dry eyes   Yes Historical Provider, MD  hydrochlorothiazide (HYDRODIURIL) 25 MG tablet Take 25 mg by mouth daily.   Yes Historical Provider, MD  HYDROcodone-acetaminophen (NORCO) 5-325 MG per tablet Take 1 tablet by mouth every 6 (six) hours as needed. For pain   Yes Historical Provider, MD  insulin glargine (LANTUS) 100 UNIT/ML injection Inject 20-30 Units into the skin at bedtime. Per sliding scale   Yes Historical Provider, MD  metFORMIN (GLUCOPHAGE) 500 MG tablet Take 1,000 mg by mouth 2 (two) times daily with a meal.   Yes Historical Provider, MD  metoCLOPramide (REGLAN) 10 MG tablet Take 10 mg by mouth every 6 (six) hours as needed. For nausea 09/05/11 09/15/11 Yes Dione Booze, MD  metoprolol tartrate (LOPRESSOR) 25 MG tablet Take 25 mg by mouth daily.   Yes Historical Provider, MD  Multiple Vitamin (MULITIVITAMIN WITH MINERALS) TABS Take 1 tablet by mouth daily.   Yes Historical Provider, MD  omeprazole (PRILOSEC) 40 MG capsule Take 40 mg by mouth 2 (two) times daily.   Yes Historical Provider, MD  simvastatin (ZOCOR) 40 MG tablet Take 40 mg by mouth every evening.   Yes Historical Provider, MD   Scheduled Medications:    . docusate sodium  100 mg Oral BID  . enoxaparin  40 mg Subcutaneous Q24H  . hydrochlorothiazide  25 mg Oral Daily    . insulin aspart  0-20 Units Subcutaneous TID WC  . metFORMIN  1,000 mg Oral BID WC  . metoprolol tartrate  25 mg Oral Daily  . mupirocin ointment  1 application Nasal BID  . pantoprazole  40 mg Oral Q1200  . simvastatin  40 mg Oral QPM  . DISCONTD: insulin aspart  0-20 Units Subcutaneous Q4H  . DISCONTD: pantoprazole (PROTONIX) IV  40 mg Intravenous QHS   PRN MED's: diphenhydrAMINE, diphenhydrAMINE, HYDROcodone-acetaminophen, menthol-cetylpyridinium, morphine, ondansetron, phenol, polyvinyl alcohol, simethicone Home: Home Living Lives With: Other (Comment) (brother (50 years old)) Type of Home: House Home Layout: One level Home Access: Ramped entrance Home Adaptive Equipment: Walker - rolling;Wheelchair - manual  Functional History: Prior Function Level of Independence: Independent with basic ADLs;Independent with transfers;Independent with homemaking with ambulation;Independent with gait;Requires assistive device for independence Driving: Yes Vocation: Retired Functional Status:  Mobility: Bed Mobility Bed Mobility: Yes Supine to Sit: 4: Min assist;HOB elevated (Comment degrees);With rails (HOB 45 degrees) Supine to Sit Details (indicate cue type and reason): Pt pulls up into sitting using bed rails before using hands to bring legs over edge of bed.  Needed assist to bring leg over. Sit to Supine: 4: Min assist;With rail Sit to Supine - Details (indicate cue type and reason): assist with legs Transfers Transfers: Yes Sit to Stand: 1: +2 Total assist;With upper extremity assist;From elevated surface;From bed;Patient percentage (comment) (Pt = 50%) Sit to Stand Details (indicate cue type and reason): Pt has to keep knees hyperextended out in front to get up. Stands with knees hyperextended and trunk flexed over walker.  Wide base to accomodate hyperextended knees. Stand to Sit: 1: +2 Total assist;To bed;Patient percentage (comment) (pt = 60%) Stand to Sit Details: assist to  control descent Ambulation/Gait Ambulation/Gait: Yes Ambulation/Gait Assistance: 1: +2 Total assist;Patient percentage (comment) (pt=70%) Ambulation Distance (Feet): 2 Feet Assistive device: Rolling walker Gait Pattern:  (wide base, hyperextended knees, flexed at hips)    ADL:    Cognition: Cognition Arousal/Alertness: Awake/alert Orientation Level: Oriented X4 Cognition Arousal/Alertness: Awake/alert Overall Cognitive Status: Appears within functional limits for tasks assessed Orientation Level: Oriented X4  Blood pressure 164/86, pulse 95, temperature 98 F (36.7 C), temperature source Oral, resp. rate 18, height 5\' 10"  (1.778 m), weight 118.389 kg (261 lb), SpO2 95.00%. Physical Exam  Nursing note and vitals reviewed. Constitutional: He is oriented to person, place, and time. He appears well-developed and well-nourished.       obese  HENT:  Head: Normocephalic and atraumatic.  Eyes: Conjunctivae and EOM are normal. Pupils are equal, round, and reactive to light.  Neck: Normal range of motion. Neck supple.  Cardiovascular: Normal rate and regular rhythm.   Pulmonary/Chest: Effort normal and breath sounds normal.  Abdominal: Soft. Bowel sounds are normal.  Musculoskeletal: He exhibits edema (1+ pedal (B)).       RUE/LLE weakness> RLE/LUE.  Neurological: He is alert and oriented to person, place, and time. Coordination abnormal.  Reflex Scores:      Tricep reflexes are 1+ on the right side and 1+ on the left side.      Bicep reflexes are 1+ on the right side and 1+ on the left side.      Brachioradialis reflexes are 1+ on the right side and 1+ on the left side.      Patellar reflexes are 1+ on the right side and 1+ on the left side.      Achilles reflexes are 1+ on the right side and 1+ on the left side.      Upper extremity strength grossly 5 out of 5. Left lower extremity is 2/5 proximal to 2+/3/5 distally. Right lower extremity grossly 1-2/5 proximal to 2/5 distally. He  does have wasting at the right calf and tibialis anterior. There is some resting tightness in the lower extremity but this is more due to contracture. Sensory exam grossly intact. Cognitively patient is quite appropriate and pleasant.  Skin: Skin is warm and dry.  Psychiatric: He has a normal mood and affect. His behavior is normal. Judgment and thought content normal.    Results for orders placed during the hospital encounter of 09/09/11 (from the past 24 hour(s))  GLUCOSE, CAPILLARY     Status: Abnormal   Collection Time   09/12/11 11:13 AM      Component Value Range   Glucose-Capillary 184 (*) 70 - 99 (mg/dL)   Comment 1 Documented in Chart     Comment 2 Notify RN    GLUCOSE, CAPILLARY     Status: Abnormal   Collection Time   09/12/11  4:43 PM      Component Value Range   Glucose-Capillary 160 (*) 70 - 99 (mg/dL)   Comment 1 Documented in Chart     Comment 2 Notify RN    GLUCOSE, CAPILLARY     Status: Abnormal   Collection Time   09/12/11  9:40 PM      Component Value Range   Glucose-Capillary 134 (*) 70 - 99 (mg/dL)  GLUCOSE, CAPILLARY     Status: Abnormal   Collection Time   09/13/11  7:56 AM      Component Value Range   Glucose-Capillary 182 (*) 70 - 99 (mg/dL)   Dg Cholangiogram Operative  09/11/2011  *RADIOLOGY REPORT*  Clinical Data:   Cholecystectomy for cholelithiasis.  INTRAOPERATIVE CHOLANGIOGRAM  Technique:  Cholangiographic images from the C-arm fluoroscopic device were submitted for interpretation post-operatively.  Please see the procedural report for the amount of contrast and the fluoroscopy time utilized.  Comparison:  CT of the abdomen dated 09/05/2011 and ultrasound of the abdomen dated 07/28/2011.  Findings:  Intraoperative cholangiogram shows normal caliber bile ducts without filling defect or obstruction.  Contrast enters the duodenum.  Visualized intrahepatic ducts are normal.  IMPRESSION: Normal intraoperative cholangiogram.  Original Report Authenticated By: Reola Calkins, M.D.    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 Patient is status post cholecystectomy. He has post polio syndrome as well. 1. Does the need for close, 24 hr/day medical supervision in concert with the patient's rehab needs make it unreasonable for this patient to be served in a less intensive setting? Yes 2. Co-Morbidities requiring supervision/potential complications: diabetes, GERD, hypertension 3. Due to bladder management, bowel management, safety, skin/wound care, disease management, medication administration, pain management and patient education, does the patient require 24 hr/day rehab nursing? Yes 4. Does the patient require coordinated care of a physician, rehab nurse, PT (1-2  hrs/day, 5 days/week) and OT (1--2 hrs/day, 5 days/week) to address physical and functional deficits in the context of the above medical diagnosis(es)? Yes Addressing deficits in the following areas: balance, endurance, locomotion, strength, transferring, bowel/bladder control, bathing, dressing, feeding, grooming, toileting and psychosocial support 5. Can the patient actively participate in an intensive therapy program of at least 3 hrs of therapy per day at least 5 days per week? Yes 6. The potential for patient to make measurable gains while on inpatient rehab is excellent 7. Anticipated functional outcomes upon discharge from inpatients are modified independent to supervision PT, modified independent to set up OT,  8. Estimated rehab length of stay to reach the above functional goals is: 1-2 weeks 9. Does the patient have adequate social supports to accommodate these discharge functional goals? Yes and Potentially 10. Anticipated D/C setting: Home 11. Anticipated post D/C treatments: HH therapy 12. Overall Rehab/Functional Prognosis: excellent  RECOMMENDATIONS: This patient's condition is appropriate for continued rehabilitative care in the following setting:  CIR Patient has agreed to participate in recommended program. Yes Note that insurance prior authorization may be required for reimbursement for recommended care.  Comment:I think this patient can progress given the fact that he is quite motivated. We did discuss the fact that ultimately he will have to reconcile with the idea that he needs more assistance at home.he'll need further adaptive equipment as well such as a wheelchair or powered ability device. He has declined over the last several weeks due to his nutritional status he feels. This certainly is possible. I think he could potentially function still at a walker level for short distances around his home. Ultimately long-term, he'll have to look for alternative livingarrangements, opinion.   Ranelle Oyster M.D. 09/13/2011

## 2011-09-12 NOTE — Progress Notes (Signed)
Thank you for consult on Mr. Timothy Gross. Note that patient admitted 04/05 and  had Lap chole yesterday.  PT evaluation pending.  Will follow up in am for formal evaluation.

## 2011-09-12 NOTE — Telephone Encounter (Signed)
Looks like the patient was admitted and operated on over the weekend.

## 2011-09-12 NOTE — Progress Notes (Signed)
May need CIR vs SNF Patient examined and I agree with the assessment and plan  Violeta Gelinas, MD, MPH, FACS Pager: [phone]  09/12/2011 2:56 PM

## 2011-09-13 ENCOUNTER — Encounter (HOSPITAL_COMMUNITY): Payer: Self-pay | Admitting: Physical Medicine and Rehabilitation

## 2011-09-13 DIAGNOSIS — B91 Sequelae of poliomyelitis: Secondary | ICD-10-CM

## 2011-09-13 DIAGNOSIS — R5381 Other malaise: Secondary | ICD-10-CM

## 2011-09-13 LAB — GLUCOSE, CAPILLARY: Glucose-Capillary: 182 mg/dL — ABNORMAL HIGH (ref 70–99)

## 2011-09-13 MED ORDER — INSULIN ASPART 100 UNIT/ML ~~LOC~~ SOLN
0.0000 [IU] | Freq: Three times a day (TID) | SUBCUTANEOUS | Status: DC
  Administered 2011-09-13 – 2011-09-14 (×4): 4 [IU] via SUBCUTANEOUS
  Administered 2011-09-14: 3 [IU] via SUBCUTANEOUS
  Administered 2011-09-15 (×2): 4 [IU] via SUBCUTANEOUS

## 2011-09-13 MED ORDER — SIMETHICONE 80 MG PO CHEW
80.0000 mg | CHEWABLE_TABLET | Freq: Four times a day (QID) | ORAL | Status: DC | PRN
  Filled 2011-09-13: qty 1

## 2011-09-13 NOTE — Progress Notes (Signed)
Patient ID: SACRAMENTO MONDS, male   DOB: 1940-09-04, 71 y.o.   MRN: [id number] 2 Days Post-Op  Subjective: Pt feels ok. C/o gas pains.  Objective: Vital signs in last 24 hours: Temp:  [98 F (36.7 C)-99.2 F (37.3 C)] 98 F (36.7 C) (04/09 0647) Pulse Rate:  [79-95] 95  (04/09 0647) Resp:  [18-20] 18  (04/09 0647) BP: (153-164)/(80-86) 164/86 mmHg (04/09 0647) SpO2:  [95 %-99 %] 95 % (04/09 0647) Last BM Date: 09/11/11  Intake/Output from previous day: 04/08 0701 - 04/09 0700 In: 1508.8 [P.O.:240; I.V.:1268.8] Out: 1775 [Urine:1775] Intake/Output this shift:    PE: Abd: soft, minimally tender, +BS, ND, incisions c/d/i  Lab Results:   Basename 09/10/11 0910  WBC 5.6  HGB 13.1  HCT 39.3  PLT 313   BMET  Basename 09/10/11 0910  NA 133*  K 3.8  CL 95*  CO2 27  GLUCOSE 119*  BUN 4*  CREATININE 0.42*  CALCIUM 8.9   PT/INR  Basename 09/10/11 0910  LABPROT 13.6  INR 1.02   CMP     Component Value Date/Time   NA 133* 09/10/2011 0910   K 3.8 09/10/2011 0910   CL 95* 09/10/2011 0910   CO2 27 09/10/2011 0910   GLUCOSE 119* 09/10/2011 0910   BUN 4* 09/10/2011 0910   CREATININE 0.42* 09/10/2011 0910   CALCIUM 8.9 09/10/2011 0910   PROT 6.7 09/10/2011 0910   ALBUMIN 3.1* 09/10/2011 0910   AST 15 09/10/2011 0910   ALT 11 09/10/2011 0910   ALKPHOS 62 09/10/2011 0910   BILITOT 0.5 09/10/2011 0910   GFRNONAA >90 09/10/2011 0910   GFRAA >90 09/10/2011 0910   Lipase     Component Value Date/Time   LIPASE 39 09/09/2011 1750       Studies/Results: Dg Cholangiogram Operative  09/11/2011  *RADIOLOGY REPORT*  Clinical Data:   Cholecystectomy for cholelithiasis.  INTRAOPERATIVE CHOLANGIOGRAM  Technique:  Cholangiographic images from the C-arm fluoroscopic device were submitted for interpretation post-operatively.  Please see the procedural report for the amount of contrast and the fluoroscopy time utilized.  Comparison:  CT of the abdomen dated 09/05/2011 and ultrasound of the abdomen dated  07/28/2011.  Findings:  Intraoperative cholangiogram shows normal caliber bile ducts without filling defect or obstruction.  Contrast enters the duodenum.  Visualized intrahepatic ducts are normal.  IMPRESSION: Normal intraoperative cholangiogram.  Original Report Authenticated By: Reola Calkins, M.D.    Anti-infectives: Anti-infectives    None       Assessment/Plan  1. S/p lap chole 2. Deconditioning, secondary to post polio syndrome  Plan: 1. Await CIR evaluation.  If patient is accepted by them, may be discharged to CIR today. 2. Add simethicone for gas   LOS: 4 days    Giovani Neumeister E 09/13/2011

## 2011-09-13 NOTE — Progress Notes (Signed)
Await CIR eval Patient examined and I agree with the assessment and plan  Violeta Gelinas, MD, MPH, FACS Pager: [phone]  09/13/2011 3:15 PM

## 2011-09-13 NOTE — Consult Note (Signed)
I will follow up in the morning with insurance carrier to seek approval to admit patient to inpatient acute rehabilitation. Please call me with any questions. Pager [phone]

## 2011-09-13 NOTE — Progress Notes (Signed)
PT Cancellation Note  Two attempts to see pt today unsuccessfully as pt requesting to get onto bed pan and eating x2. Will f/u as time allows.   [facility] HELEN 09/13/2011, 1:28 PM Pager: [phone]

## 2011-09-13 NOTE — Progress Notes (Signed)
UR of chart complete.  

## 2011-09-14 LAB — GLUCOSE, CAPILLARY
Glucose-Capillary: 153 mg/dL — ABNORMAL HIGH (ref 70–99)
Glucose-Capillary: 118 mg/dL — ABNORMAL HIGH (ref 70–99)
Glucose-Capillary: 135 mg/dL — ABNORMAL HIGH (ref 70–99)

## 2011-09-14 MED ORDER — BENAZEPRIL HCL 40 MG PO TABS
40.0000 mg | ORAL_TABLET | Freq: Every day | ORAL | Status: DC
  Administered 2011-09-14 – 2011-09-15 (×2): 40 mg via ORAL
  Filled 2011-09-14 (×2): qty 1

## 2011-09-14 NOTE — Progress Notes (Signed)
Patient ID: Timothy Gross, male   DOB: 17-Apr-1941, 71 y.o.   MRN: [id number] 3 Days Post-Op  Subjective: Pt feels ok except mad about breakfast selections. Had loose stools yesterday, none so far this morning. Took one pain pill this morning but painfree currently. Is very motivated and hopeful to participate in rehab.  Objective: Vital signs in last 24 hours: Temp:  [98.1 F (36.7 C)-98.4 F (36.9 C)] 98.3 F (36.8 C) (04/10 0554) Pulse Rate:  [87-99] 99  (04/10 0554) Resp:  [18-20] 18  (04/10 0554) BP: (160-179)/(85-99) 162/99 mmHg (04/10 0554) SpO2:  [97 %-98 %] 98 % (04/10 0554) Last BM Date: 09/11/11  Intake/Output from previous day: 04/09 0701 - 04/10 0700 In: 3293 [P.O.:720; I.V.:2573] Out: 1650 [Urine:1650] Intake/Output this shift:    PE: GEN: NAD, eating bf CV: RRR PULM: CTAB Abd: soft, minimally tender, +BS, ND, incisions c/d/i  Lab Results:  No results found for this basename: WBC:2,HGB:2,HCT:2,PLT:2 in the last 72 hours BMET No results found for this basename: NA:2,K:2,CL:2,CO2:2,GLUCOSE:2,BUN:2,CREATININE:2,CALCIUM:2 in the last 72 hours PT/INR No results found for this basename: LABPROT:2,INR:2 in the last 72 hours CMP     Component Value Date/Time   NA 133* 09/10/2011 0910   K 3.8 09/10/2011 0910   CL 95* 09/10/2011 0910   CO2 27 09/10/2011 0910   GLUCOSE 119* 09/10/2011 0910   BUN 4* 09/10/2011 0910   CREATININE 0.42* 09/10/2011 0910   CALCIUM 8.9 09/10/2011 0910   PROT 6.7 09/10/2011 0910   ALBUMIN 3.1* 09/10/2011 0910   AST 15 09/10/2011 0910   ALT 11 09/10/2011 0910   ALKPHOS 62 09/10/2011 0910   BILITOT 0.5 09/10/2011 0910   GFRNONAA >90 09/10/2011 0910   GFRAA >90 09/10/2011 0910   Lipase     Component Value Date/Time   LIPASE 39 09/09/2011 1750       Studies/Results: No results found.  Anti-infectives: Anti-infectives    None        . docusate sodium  100 mg Oral BID  . enoxaparin  40 mg Subcutaneous Q24H  . hydrochlorothiazide  25 mg Oral Daily  .  insulin aspart  0-20 Units Subcutaneous TID WC  . metFORMIN  1,000 mg Oral BID WC  . metoprolol tartrate  25 mg Oral Daily  . mupirocin ointment  1 application Nasal BID  . pantoprazole  40 mg Oral Q1200  . simvastatin  40 mg Oral QPM    Assessment/Plan  1. S/p lap chole 2. Deconditioning, secondary to post polio syndrome 3. HTN, elevated recently  Plan: 1. Pt appropriate candidate for CIR, pending insurance approval. Pt is stable to be discharged to CIR today. 2. Add simethicone for gas  3. Restart benazapril at home dose, renal function wnl. Continue other home meds metoprolol and HCTZ.    LOS: 5 days    [facility] 09/14/2011

## 2011-09-14 NOTE — ED Provider Notes (Signed)
I saw and evaluated the patient, reviewed the resident's note and I agree with the findings and plan.   .Face to face Exam:  General:  Awake HEENT:  Atraumatic Resp:  Normal effort Abd:  Nondistended Neuro:No focal weakness Lymph: No adenopathy   Myrle Wanek L Pete Merten, MD 09/14/11 2203 

## 2011-09-14 NOTE — Progress Notes (Signed)
PT Cancellation Note  Treatment cancelled today due to patient's refusal to participate. Pt reporting that he is too tired after having to get up and down from the chair multiple times this morning. Will f/u tomorrow.   [facility] HELEN 09/14/2011, 1:52 PM Pager: [phone]

## 2011-09-14 NOTE — Progress Notes (Signed)
Clinical Social Work Department BRIEF PSYCHOSOCIAL ASSESSMENT 09/14/2011  Patient:  ESTIL, VALLEE     Account Number:  [id number]     Admit date:  09/09/2011  Clinical Social Worker:  Margaree Mackintosh  Date/Time:  09/14/2011 03:42 PM  Referred by:  Physician  Date Referred:  09/14/2011 Referred for  SNF Placement   Other Referral:   Interview type:  Patient Other interview type:    PSYCHOSOCIAL DATA Living Status:  ALONE Admitted from facility:   Level of care:   Primary support name:  [phone] Primary support relationship to patient:  SIBLING Degree of support available:   Unknown.    CURRENT CONCERNS Current Concerns  Post-Acute Placement   Other Concerns:    SOCIAL WORK ASSESSMENT / PLAN Clinical Social Worker met with pt at bedside.  CSW introduced self and explained role.  CSW confirmed plan for SNF.  CSW reviewed SNF process and provided opportunity for pt to process questions/concerns.  CSW to fax pt out to [facility].   Assessment/plan status:  Information/Referral to [facility] Other assessment/ plan:   Information/referral to community resources:   SNF list    PATIENT'S/FAMILY'S RESPONSE TO PLAN OF CARE: Pt was polite and receptive to CSW intervention.        Angelia Mould, MSW, [place] [phone]

## 2011-09-14 NOTE — Evaluation (Signed)
Occupational Therapy Evaluation Patient Details Name: Timothy Gross MRN: [id number] DOB: 1940/12/12 Today's Date: 09/14/2011  Problem List:  Patient Active Problem List  Diagnoses  . Hypertension  . History of UTI  . Iron deficiency anemia  . DM (diabetes mellitus), type 2, uncontrolled  . Hyperlipidemia  . GERD (gastroesophageal reflux disease)  . Hyponatremia  . Elevated LFTs  . Abdominal pain  . Generalized weakness  . History of post-polio syndrome  . Dehydration  . Symptomatic cholelithiasis  . Constipation    Past Medical History:  Past Medical History  Diagnosis Date  . Diabetes mellitus   . Hypertension   . History of UTI   . Hyperlipidemia   . GERD (gastroesophageal reflux disease)   . History of post-polio syndrome     right sided weakness  . H/O acute alcoholic hepatitis   . Cervical myelopathy     Hx of  . Right patella fracture   . Right sided weakness   . Iron deficiency anemia    Past Surgical History:  Past Surgical History  Procedure Date  . Cervical fusion 04/16/2003  . Orif right femur fracture   . Radioactive iodine treatment 1984  . Esophagogastroduodenoscopy 07/28/2011    Procedure: ESOPHAGOGASTRODUODENOSCOPY (EGD);  Surgeon: Louis Meckel, MD;  Location: [facility];  Service: Endoscopy;  Laterality: N/A;  . Cholecystectomy 09/11/2011    Procedure: LAPAROSCOPIC CHOLECYSTECTOMY WITH INTRAOPERATIVE CHOLANGIOGRAM;  Surgeon: Atilano Ina, MD,FACS;  Location: [facility];  Service: General;  Laterality: N/A;    OT Assessment/Plan/Recommendation OT Assessment Clinical Impression Statement: Pleasant 71 yr old male admitted for choleccystectomy and also with history of post polio syndrome.  Currently presents with increased dependence for all basic selfcare tasks and requires total assist +2 for toileting and sit to stand during LB selfcare.  Feel he will benefit from acute OT services to address these deficits and increase independence with all ADLs  and increase his quality of life.  Pt will need follow-up SNF level rehab prior to home since insurance will not approve inpatient rehab. OT Recommendation/Assessment: Patient will need skilled OT in the acute care venue OT Problem List: Decreased strength;Decreased activity tolerance;Impaired balance (sitting and/or standing);Pain;Decreased knowledge of use of DME or AE Barriers to Discharge: Decreased caregiver support OT Therapy Diagnosis : Generalized weakness;Acute pain OT Plan OT Frequency: Min 1X/week OT Treatment/Interventions: Self-care/ADL training;Therapeutic activities;DME and/or AE instruction;Balance training;Patient/family education OT Recommendation Follow Up Recommendations: Skilled nursing facility Equipment Recommended: Defer to next venue Individuals Consulted Consulted and Agree with Results and Recommendations: Patient OT Goals Acute Rehab OT Goals OT Goal Formulation: With patient Time For Goal Achievement: 2 weeks ADL Goals Pt Will Perform Lower Body Bathing: with max assist;Sit to stand from chair;Sit to stand from bed ADL Goal: Lower Body Bathing - Progress: Goal set today Pt Will Perform Lower Body Dressing: with max assist;Sit to stand from chair;Sit to stand from bed ADL Goal: Lower Body Dressing - Progress: Goal set today Pt Will Transfer to Toilet: 3-in-1;with max assist;with DME;Extra wide 3-in-1 ADL Goal: Toilet Transfer - Progress: Goal set today Pt Will Perform Toileting - Clothing Manipulation: Sitting on 3-in-1 or toilet;with max assist;Standing ADL Goal: Toileting - Clothing Manipulation - Progress: Goal set today Pt Will Perform Toileting - Hygiene: with min assist;Sitting on 3-in-1 or toilet ADL Goal: Toileting - Hygiene - Progress: Goal set today Arm Goals Additional Arm Goal #1: Pt will perform BUE AROM/light strengtheing exercises with supervision to increase UE strength and endurance for  slefcare tasks.  OT  Evaluation Precautions/Restrictions  Precautions Precautions: Fall Precaution Comments: History of polio affecting her LEs Required Braces or Orthoses: No Restrictions Weight Bearing Restrictions: No Prior Functioning Home Living Bathroom Toilet: Standard Bathroom Accessibility: Yes How Accessible: Accessible via walker Home Adaptive Equipment: Grab bars around toilet;Bedside commode/3-in-1   ADL ADL Eating/Feeding: Performed;Independent Where Assessed - Eating/Feeding: Edge of bed Grooming: Simulated;Set up Where Assessed - Grooming: Sitting, bed Upper Body Bathing: Simulated;Set up Where Assessed - Upper Body Bathing: Unsupported;Sitting, bed Lower Body Bathing: +2 Total assistance Lower Body Bathing Details (indicate cue type and reason): Pt 40% for sit to stand from elevated EOB. Where Assessed - Lower Body Bathing: Sit to stand from bed Upper Body Dressing: Simulated;Set up Where Assessed - Upper Body Dressing: Sitting, bed;Unsupported Lower Body Dressing: Simulated;+2 Total assistance Lower Body Dressing Details (indicate cue type and reason): Pt 40% for sit to stand from elevated EOB. Where Assessed - Lower Body Dressing: Sit to stand from bed Toilet Transfer: Performed;+2 Total assistance Toilet Transfer Details (indicate cue type and reason): Pt 40% for sit to stand from elevated EOB. Toilet Transfer Method: Stand pivot (With use of RW.) Acupuncturist: Bedside commode Toileting - Clothing Manipulation: Performed;+2 Total assistance Toileting - Clothing Manipulation Details (indicate cue type and reason): Pt 40% for sit to stand from elevated EOB. Where Assessed - Toileting Clothing Manipulation: Sit to stand from 3-in-1 or toilet Toileting - Hygiene: Performed;+2 Total assistance Toileting - Hygiene Details (indicate cue type and reason): Pt 40% for sit to stand from elevated EOB. Where Assessed - Toileting Hygiene: Sit to stand from 3-in-1 or  toilet Ambulation Related to ADLs: Pt currently total +2  (pt 40%) to take a few steps around to the bedside commode. ADL Comments: Pt with decreased ability to perform sit to stand.  Needs increased height and use of arm rests for sit to stand.  Able to left LEs up to the edge of the bed to donn gripper socks and slippers.   Vision/Perception  Vision - History Baseline Vision: Wears glasses all the time Patient Visual Report: No change from baseline Vision - Assessment Eye Alignment: Within Functional Limits Perception Perception: Within Functional Limits Praxis Praxis: Intact Cognition Cognition Arousal/Alertness: Awake/alert Overall Cognitive Status: Appears within functional limits for tasks assessed Orientation Level: Oriented X4 Sensation/Coordination Sensation Light Touch: Appears Intact (In bilateral UEs.) Stereognosis: Not tested Hot/Cold: Not tested Proprioception: Not tested Coordination Gross Motor Movements are Fluid and Coordinated: Yes Fine Motor Movements are Fluid and Coordinated: Yes Extremity Assessment RUE Assessment RUE Assessment: Exceptions to [facility] RUE AROM (degrees) RUE Overall AROM Comments: Pt with decreased shoulder flexion AROM 0-100 degrees.  Shoulder strength 3-/5.  Elbow flexion 4/5 and elbow extension 3=/5.  Grip strength 4/5. LUE Assessment LUE Assessment: Exceptions to WFL LUE AROM (degrees) LUE Overall AROM Comments: Shoulder AROM limited to approximately 100 degrees.  Elbow flexion and extension WFLS.  Elbow strength 4/5.  Grip strength 4/5 as well. Mobility  Bed Mobility Bed Mobility: Yes Supine to Sit: 4: Min assist;HOB elevated (Comment degrees);With rails Transfers Transfers: Yes Sit to Stand: From bed;From toilet;Without upper extremity assist;From elevated surface;1: +2 Total assist Sit to Stand Details (indicate cue type and reason): Total +2 pt 40% for sit to stand. Stand to Sit: 1: +2 Total assist;Without upper extremity  assist;With armrests;To chair/3-in-1 Stand to Sit Details: Total +2 pt 40% for sit to stand.  End of Session OT - End of Session Equipment Utilized During  Treatment: Gait belt Activity Tolerance: Patient tolerated treatment well Patient left: in chair;with call bell in reach Nurse Communication: Mobility status for transfers;Need for lift equipment General Behavior During Session: [facility] for tasks performed Cognition: [facility] for tasks performed   [facility] 09/14/2011, 11:40 AM

## 2011-09-14 NOTE — Progress Notes (Signed)
Patient's insurance will not approve inpatient acute rehab at this time. I have made patient aware that they will approve SNF. Patient is in agreement and I have alerted SW. Please call for any questions. Pager [phone]

## 2011-09-14 NOTE — Progress Notes (Signed)
Will check stool for c-diff, CBC in AM Patient examined and I agree with the assessment and plan  Violeta Gelinas, MD, MPH, FACS Pager: [phone]  09/14/2011 5:13 PM

## 2011-09-15 LAB — GLUCOSE, CAPILLARY: Glucose-Capillary: 180 mg/dL — ABNORMAL HIGH (ref 70–99)

## 2011-09-15 LAB — CBC
Hemoglobin: 12 g/dL — ABNORMAL LOW (ref 13.0–17.0)
RBC: 4.39 MIL/uL (ref 4.22–5.81)
WBC: 5.3 10*3/uL (ref 4.0–10.5)

## 2011-09-15 LAB — CLOSTRIDIUM DIFFICILE BY PCR: Toxigenic C. Difficile by PCR: NEGATIVE

## 2011-09-15 MED ORDER — HYDROCODONE-ACETAMINOPHEN 5-325 MG PO TABS: 1.0000 | ORAL_TABLET | ORAL | Status: DC | PRN

## 2011-09-15 MED ORDER — LOPERAMIDE HCL 2 MG PO CAPS: 2.0000 mg | ORAL_CAPSULE | ORAL | Status: DC | PRN

## 2011-09-15 NOTE — Progress Notes (Signed)
PT Cancellation Note  Treatment cancelled today due to patient's refusal to participate. Pt not feeling well with c/o diarrhea. Not willing to participate right now with therapies.  [facility] HELEN 09/15/2011, 12:57 PM Pager: [phone]

## 2011-09-15 NOTE — Progress Notes (Signed)
Clinical Social Worker received confirmation from RN and NP that pt is ready for dc.  CSW coordinated with RN and SNF for dc time.  Pt made aware and transport scheduled.  CSW signing off at this time, please re consult if needed.   Angelia Mould, MSW, [place] [phone]

## 2011-09-15 NOTE — Discharge Instructions (Signed)
[facility] ______[facility] °LAPAROSCOPIC SURGERY: POST OP INSTRUCTIONS °Always review your discharge instruction sheet given to you by the facility where your surgery was performed. °IF YOU HAVE DISABILITY OR FAMILY LEAVE FORMS, YOU MUST BRING THEM TO THE OFFICE FOR PROCESSING.   °DO NOT GIVE THEM TO YOUR DOCTOR. ° °1. A prescription for pain medication may be given to you upon discharge.  Take your pain medication as prescribed, if needed.  If narcotic pain medicine is not needed, then you may take acetaminophen (Tylenol) or ibuprofen (Advil) as needed. °2. Take your usually prescribed medications unless otherwise directed. °3. If you need a refill on your pain medication, please contact your pharmacy.  They will contact our office to request authorization. Prescriptions will not be filled after 5pm or on week-ends. °4. You should follow a light diet the first few days after arrival home, such as soup and crackers, etc.  Be sure to include lots of fluids daily. °5. Most patients will experience some swelling and bruising in the area of the incisions.  Ice packs will help.  Swelling and bruising can take several days to resolve.  °6. It is common to experience some constipation if taking pain medication after surgery.  Increasing fluid intake and taking a stool softener (such as Colace) will usually help or prevent this problem from occurring.  A mild laxative (Milk of Magnesia or Miralax) should be taken according to package instructions if there are no bowel movements after 48 hours. °7. Unless discharge instructions indicate otherwise, you may remove your bandages 24-48 hours after surgery, and you may shower at that time.  You may have steri-strips (small skin tapes) in place directly over the incision.  These strips should be left on the skin for 7-10 days.  If your surgeon used skin glue on the incision, you may shower in 24 hours.  The glue will flake off over the next 2-3 weeks.  Any sutures or  staples will be removed at the office during your follow-up visit. °8. ACTIVITIES:  You may resume regular (light) daily activities beginning the next day--such as daily self-care, walking, climbing stairs--gradually increasing activities as tolerated.  You may have sexual intercourse when it is comfortable.  Refrain from any heavy lifting or straining until approved by your doctor. °a. You may drive when you are no longer taking prescription pain medication, you can comfortably wear a seatbelt, and you can safely maneuver your car and apply brakes. °b. RETURN TO WORK:  __________________________________________________________ °9. You should see your doctor in the office for a follow-up appointment approximately 2-3 weeks after your surgery.  Make sure that you call for this appointment within a day or two after you arrive home to insure a convenient appointment time. °10. OTHER INSTRUCTIONS: __________________________________________________________________________________________________________________________ __________________________________________________________________________________________________________________________ °WHEN TO CALL YOUR DOCTOR: °1. Fever over 101.0 °2. Inability to urinate °3. Continued bleeding from incision. °4. Increased pain, redness, or drainage from the incision. °5. Increasing abdominal pain ° °The clinic staff is available to answer your questions during regular business hours.  Please don’t hesitate to call and ask to speak to one of the nurses for clinical concerns.  If you have a medical emergency, go to the nearest emergency room or call 911.  A surgeon from [facility] is always on call at the hospital. °[address], [address], [place], [place]  [address] ? [address] [address], [place], [place]   [address] °[phone] ? [phone] ? FAX [phone] °Web site:   [email or web address] °

## 2011-09-15 NOTE — Discharge Summary (Signed)
Violeta Gelinas, MD, MPH, FACS Pager: [phone] [facility] signed

## 2011-09-15 NOTE — Discharge Summary (Signed)
Patient ID: Timothy Gross MRN: [id number] DOB/AGE: 1941/03/18 71 y.o.  Admit date: 09/09/2011 Discharge date: 09/15/2011  Procedures: lap chole  Consults: None  Reason for Admission: This is a 71 yo man who presented to [facility] with RUQ abdominal pain.  He was found to have acute cholecystitis.  He was admitted.  Admission Diagnoses:  1. Acute cholecystitis Patient Active Problem List  Diagnoses  . Hypertension  . History of UTI  . Iron deficiency anemia  . DM (diabetes mellitus), type 2, uncontrolled  . Hyperlipidemia  . GERD (gastroesophageal reflux disease)  . Hyponatremia  . Elevated LFTs  . Abdominal pain  . Generalized weakness  . History of post-polio syndrome  . Dehydration  . Symptomatic cholelithiasis  . Constipation    Hospital Course: The patient was admitted and taken to the operating room where he underwent a lap chole.  He tolerated that procedure well.  He was very weak postoperatively and was felt by PT/OT to be a good candidate for CIR secondary to post polio syndrome.  Insurance denied CIR and so the patient was here for several days awaiting SNF placement.  He did have some diarrhea.  C. Diff was negative.  He was started on imodium and diarrhea was felt to be secondary to lap chole.  He was felt stable and discharge to SNF in good condition.  Discharge Diagnoses:  Principal Problem:  *Symptomatic cholelithiasis Active Problems:  Hypertension  DM (diabetes mellitus), type 2, uncontrolled  GERD (gastroesophageal reflux disease)  History of post-polio syndrome  Constipation s/p lap chole Post polio syndrome with acute deconditioning Diarrhea, secondary to lap chole  Discharge Medications: Medication List  As of 09/15/2011  9:54 AM   TAKE these medications         benazepril 40 MG tablet   Commonly known as: LOTENSIN   Take 40 mg by mouth daily.      dextran 70-hypromellose ophthalmic solution   Place 1 drop into both eyes 3 (three) times daily as  needed. For dry eyes      hydrochlorothiazide 25 MG tablet   Commonly known as: HYDRODIURIL   Take 25 mg by mouth daily.      HYDROcodone-acetaminophen 5-325 MG per tablet   Commonly known as: NORCO   Take 1-2 tablets by mouth every 4 (four) hours as needed. For pain      insulin glargine 100 UNIT/ML injection   Commonly known as: LANTUS   Inject 20-30 Units into the skin at bedtime. Per sliding scale      metFORMIN 500 MG tablet   Commonly known as: GLUCOPHAGE   Take 1,000 mg by mouth 2 (two) times daily with a meal.      metoCLOPramide 10 MG tablet   Commonly known as: REGLAN   Take 10 mg by mouth every 6 (six) hours as needed. For nausea      metoprolol tartrate 25 MG tablet   Commonly known as: LOPRESSOR   Take 25 mg by mouth daily.      mulitivitamin with minerals Tabs   Take 1 tablet by mouth daily.      omeprazole 40 MG capsule   Commonly known as: PRILOSEC   Take 40 mg by mouth 2 (two) times daily.      simvastatin 40 MG tablet   Commonly known as: ZOCOR   Take 40 mg by mouth every evening.          Imodium, prn diarrhea  Discharge Instructions: Follow-up Information  Follow up with Alva Garnet., MD. (As needed)       Follow up with [facility] H, MD. Schedule an appointment as soon as possible for a visit in 3 weeks.   Contact information:   [employer] [address] [place] [address] [phone]          Signed: Letha Cape 09/15/2011, 9:54 AM

## 2011-09-15 NOTE — Progress Notes (Signed)
Clinical Social Work Department CLINICAL SOCIAL WORK PLACEMENT NOTE 09/15/2011  Patient:  BAPTISTE, LITTLER  Account Number:  [id number] Admit date:  09/09/2011  Clinical Social Worker:  Margaree Mackintosh  Date/time:  09/15/2011 01:26 PM  Clinical Social Work is seeking post-discharge placement for this patient at the following level of care:   SKILLED NURSING   (*CSW will update this form in Epic as items are completed)   09/14/2011  Patient/family provided with [facility] Department of Clinical Social Work's list of facilities offering this level of care within the geographic area requested by the patient (or if unable, by the patient's family).  09/14/2011  Patient/family informed of their freedom to choose among providers that offer the needed level of care, that participate in Medicare, Medicaid or managed care program needed by the patient, have an available bed and are willing to accept the patient.  09/14/2011  Patient/family informed of [facility]' ownership interest in [facility], as well as of the fact that they are under no obligation to receive care at this facility.  PASARR submitted to EDS on 09/14/2011 PASARR number received from EDS on 09/14/2011  FL2 transmitted to all facilities in geographic area requested by pt/family on  09/14/2011 FL2 transmitted to all facilities within larger geographic area on   Patient informed that his/her managed care company has contracts with or will negotiate with  certain facilities, including the following:     Patient/family informed of bed offers received:  09/15/2011 Patient chooses bed at [facility] Physician recommends and patient chooses bed at  [facility]  Patient to be transferred to [facility] on  09/15/2011 Patient to be transferred to facility by [facility].  The following physician request were entered in Epic:   Additional Comments:  Angelia Mould, MSW, [employer] [phone]

## 2011-09-15 NOTE — Progress Notes (Signed)
Patient examined and I agree with the assessment and plan  Timothy Gelinas, MD, MPH, FACS Pager: [phone]  09/15/2011 2:22 PM

## 2011-09-15 NOTE — Progress Notes (Signed)
Patient ID: Timothy Gross, male   DOB: [birth year], 71 y.o.   MRN: [id number] 4 Days Post-Op  Subjective: Pt grumpy this morning.  Says he had a restless night.  C/o some crampy abdominal pain with eating and diarrhea.  His c. Diff is negative.  C/o throat pain  Objective: Vital signs in last 24 hours: Temp:  [97.8 F (36.6 C)-98.4 F (36.9 C)] 97.8 F (36.6 C) (04/11 0558) Pulse Rate:  [88-92] 91  (04/11 0558) Resp:  [17-18] 18  (04/11 0558) BP: (136-180)/(67-84) 153/82 mmHg (04/11 0558) SpO2:  [96 %-98 %] 96 % (04/11 0558) Last BM Date: 09/14/11  Intake/Output from previous day: 04/10 0701 - 04/11 0700 In: 2915 [I.V.:2915] Out: 1400 [Urine:1400] Intake/Output this shift:    PE: Abd: soft, minimally tender, +BS, ND, obese, incisions c/d/i  Lab Results:   Basename 09/15/11 0640  WBC 5.3  HGB 12.0*  HCT 36.6*  PLT 324   BMET No results found for this basename: NA:2,K:2,CL:2,CO2:2,GLUCOSE:2,BUN:2,CREATININE:2,CALCIUM:2 in the last 72 hours PT/INR No results found for this basename: LABPROT:2,INR:2 in the last 72 hours CMP     Component Value Date/Time   NA 133* 09/10/2011 0910   K 3.8 09/10/2011 0910   CL 95* 09/10/2011 0910   CO2 27 09/10/2011 0910   GLUCOSE 119* 09/10/2011 0910   BUN 4* 09/10/2011 0910   CREATININE 0.42* 09/10/2011 0910   CALCIUM 8.9 09/10/2011 0910   PROT 6.7 09/10/2011 0910   ALBUMIN 3.1* 09/10/2011 0910   AST 15 09/10/2011 0910   ALT 11 09/10/2011 0910   ALKPHOS 62 09/10/2011 0910   BILITOT 0.5 09/10/2011 0910   GFRNONAA >90 09/10/2011 0910   GFRAA >90 09/10/2011 0910   Lipase     Component Value Date/Time   LIPASE 39 09/09/2011 1750       Studies/Results: No results found.  Anti-infectives: Anti-infectives    None       Assessment/Plan  1. S/p lap chole 2. Deconditioning secondary to post polio syndrome 3. Diarrhea   Plan: 1. Add imodium for diarrhea.  Suspect crampy pain and diarrhea is secondary to lap chole as this can be a normal side effect  from surgery.   2. Cont chloraseptic spray and lozenges for throat pain. 3. When SNF bed available, patient is medically stable 4. C. Diff negative.  LOS: 6 days    Damisha Wolff E 09/15/2011

## 2011-09-20 ENCOUNTER — Telehealth (INDEPENDENT_AMBULATORY_CARE_PROVIDER_SITE_OTHER): Payer: Self-pay | Admitting: General Surgery

## 2011-09-20 NOTE — Telephone Encounter (Signed)
Message copied by Liliana Cline on Tue Sep 20, 2011  5:48 PM ------      Message from: Ivory Broad      Created: Tue Sep 20, 2011  3:41 PM      Contact: Marchelle Folks [facility]) [phone]       This is a pt of Dr Tawana Scale      ----- Message -----         From: Marnette Burgess         Sent: 09/20/2011   3:21 PM           To: Ivory Broad, RN            Called to schedule 3wk po, discharged on 09/15/11, please call.

## 2011-09-20 NOTE — Telephone Encounter (Signed)
Appt made for 09/26/11 @ 8:30 am. Called patient, no answer. No way to leave message.

## 2011-09-26 ENCOUNTER — Encounter (INDEPENDENT_AMBULATORY_CARE_PROVIDER_SITE_OTHER): Payer: Medicare Other | Admitting: General Surgery

## 2011-09-26 ENCOUNTER — Telehealth (INDEPENDENT_AMBULATORY_CARE_PROVIDER_SITE_OTHER): Payer: Self-pay | Admitting: General Surgery

## 2011-09-26 NOTE — Telephone Encounter (Signed)
Message copied by Liliana Cline on Mon Sep 26, 2011 12:59 PM ------      Message from: Marnette Burgess      Created: Mon Sep 26, 2011 12:22 PM      Contact: Sol @ [employer] [phone]       Missed today's appt, stated they were unaware of appt, needs to be r/s, please call.

## 2011-09-26 NOTE — Telephone Encounter (Signed)
Called patient again. No answer. No way to leave message. If patient calls please get Lesly Rubenstein so I can schedule appt or schedule for Dr Andrey Campanile one day at 8:45 am on an AM clinic.

## 2011-10-10 ENCOUNTER — Telehealth (INDEPENDENT_AMBULATORY_CARE_PROVIDER_SITE_OTHER): Payer: Self-pay | Admitting: General Surgery

## 2011-10-10 NOTE — Telephone Encounter (Signed)
Received call from [facility], nursing at [facility], because he needs post op appt.  He apparently did not have listed where he could be reached, so he still needs appt.  The nurse at [facility] was quite abrupt with the triage nurse.  Please call her with appt:  Fol at [phone] or ask for [facility] 100.

## 2011-10-10 NOTE — Telephone Encounter (Signed)
Jacqlyn Larsen called back status post Dr York Ram seeing patient requesting sooner appt. He is having abdominal pain and diarrhea. I made appt with Dr Carolynne Edouard for 10/11/11.

## 2011-10-10 NOTE — Telephone Encounter (Signed)
Spoke to [place] and given appt for 11/02/11. Made aware I have tried to call several times and make follow up appt and no answer. This number was never left.

## 2011-10-11 ENCOUNTER — Encounter (INDEPENDENT_AMBULATORY_CARE_PROVIDER_SITE_OTHER): Payer: Self-pay | Admitting: General Surgery

## 2011-10-11 ENCOUNTER — Ambulatory Visit (INDEPENDENT_AMBULATORY_CARE_PROVIDER_SITE_OTHER): Payer: Medicare Other | Admitting: General Surgery

## 2011-10-11 VITALS — BP 137/82 | HR 72 | Temp 97.2°F | Resp 14 | Ht 70.5 in | Wt 257.0 lb

## 2011-10-11 DIAGNOSIS — K8020 Calculus of gallbladder without cholecystitis without obstruction: Secondary | ICD-10-CM

## 2011-10-11 MED ORDER — CHOLESTYRAMINE 4 G PO PACK: 1.0000 | PACK | Freq: Three times a day (TID) | ORAL | Status: DC

## 2011-10-11 NOTE — Progress Notes (Signed)
Subjective:     Patient ID: Timothy Gross, male   DOB: May 26, 1941, 71 y.o.   MRN: [id number]  HPI The patient is a 71 year old white male who is one month status post laparoscopic cholecystectomy by Dr. Andrey Campanile. His main complaint is of diarrhea after eating. He states that this is interfering with his physical therapy. Otherwise he denies any fevers or chills.  Review of Systems     Objective:   Physical Exam On exam his abdomen is soft with minimal to no tenderness in the right upper quadrant. His incisions have all healed nicely.    Assessment:     Status post laparoscopic cholecystectomy now with some persistent diarrhea. He has been checked for Clostridium difficile and this has been negative    Plan:     At this point I will prescribe him some cholestyramine to take with meals. We will have him followup with Dr. Andrey Campanile the next 2 weeks to check his progress

## 2011-10-11 NOTE — Patient Instructions (Signed)
Try cholestyramine for diarrhea. 

## 2011-10-12 ENCOUNTER — Ambulatory Visit (INDEPENDENT_AMBULATORY_CARE_PROVIDER_SITE_OTHER): Payer: Medicare Other | Admitting: General Surgery

## 2011-11-02 ENCOUNTER — Encounter (INDEPENDENT_AMBULATORY_CARE_PROVIDER_SITE_OTHER): Payer: Medicare Other | Admitting: General Surgery

## 2011-12-16 ENCOUNTER — Encounter (INDEPENDENT_AMBULATORY_CARE_PROVIDER_SITE_OTHER): Payer: Medicare Other | Admitting: General Surgery

## 2012-04-23 ENCOUNTER — Ambulatory Visit: Payer: Medicare Other | Attending: Geriatric Medicine | Admitting: Physical Therapy

## 2012-04-23 DIAGNOSIS — M6281 Muscle weakness (generalized): Secondary | ICD-10-CM | POA: Insufficient documentation

## 2012-04-23 DIAGNOSIS — IMO0001 Reserved for inherently not codable concepts without codable children: Secondary | ICD-10-CM | POA: Insufficient documentation

## 2013-12-18 ENCOUNTER — Other Ambulatory Visit: Payer: Self-pay | Admitting: Family

## 2013-12-18 DIAGNOSIS — I2510 Atherosclerotic heart disease of native coronary artery without angina pectoris: Secondary | ICD-10-CM

## 2013-12-18 DIAGNOSIS — I639 Cerebral infarction, unspecified: Secondary | ICD-10-CM

## 2014-05-23 ENCOUNTER — Encounter (HOSPITAL_COMMUNITY): Payer: Self-pay

## 2014-05-23 ENCOUNTER — Telehealth: Payer: Self-pay | Admitting: *Deleted

## 2014-05-23 ENCOUNTER — Emergency Department (HOSPITAL_COMMUNITY)
Admission: EM | Admit: 2014-05-23 | Discharge: 2014-05-23 | Disposition: A | Discharge status: Home / Self Care | Payer: Medicare HMO | Attending: Emergency Medicine | Admitting: Emergency Medicine

## 2014-05-23 ENCOUNTER — Emergency Department (HOSPITAL_COMMUNITY): Payer: Medicare HMO

## 2014-05-23 DIAGNOSIS — Z79899 Other long term (current) drug therapy: Secondary | ICD-10-CM | POA: Insufficient documentation

## 2014-05-23 DIAGNOSIS — Z87440 Personal history of urinary (tract) infections: Secondary | ICD-10-CM | POA: Diagnosis not present

## 2014-05-23 DIAGNOSIS — Z8619 Personal history of other infectious and parasitic diseases: Secondary | ICD-10-CM | POA: Insufficient documentation

## 2014-05-23 DIAGNOSIS — Z8781 Personal history of (healed) traumatic fracture: Secondary | ICD-10-CM | POA: Diagnosis not present

## 2014-05-23 DIAGNOSIS — Z862 Personal history of diseases of the blood and blood-forming organs and certain disorders involving the immune mechanism: Secondary | ICD-10-CM | POA: Diagnosis not present

## 2014-05-23 DIAGNOSIS — E119 Type 2 diabetes mellitus without complications: Secondary | ICD-10-CM | POA: Insufficient documentation

## 2014-05-23 DIAGNOSIS — E785 Hyperlipidemia, unspecified: Secondary | ICD-10-CM | POA: Diagnosis not present

## 2014-05-23 DIAGNOSIS — K219 Gastro-esophageal reflux disease without esophagitis: Secondary | ICD-10-CM | POA: Insufficient documentation

## 2014-05-23 DIAGNOSIS — Z8739 Personal history of other diseases of the musculoskeletal system and connective tissue: Secondary | ICD-10-CM | POA: Diagnosis not present

## 2014-05-23 DIAGNOSIS — Z87891 Personal history of nicotine dependence: Secondary | ICD-10-CM | POA: Diagnosis not present

## 2014-05-23 DIAGNOSIS — Z8669 Personal history of other diseases of the nervous system and sense organs: Secondary | ICD-10-CM | POA: Insufficient documentation

## 2014-05-23 DIAGNOSIS — Z8612 Personal history of poliomyelitis: Secondary | ICD-10-CM | POA: Diagnosis not present

## 2014-05-23 DIAGNOSIS — I10 Essential (primary) hypertension: Secondary | ICD-10-CM | POA: Diagnosis not present

## 2014-05-23 DIAGNOSIS — Z794 Long term (current) use of insulin: Secondary | ICD-10-CM | POA: Diagnosis not present

## 2014-05-23 DIAGNOSIS — R109 Unspecified abdominal pain: Secondary | ICD-10-CM

## 2014-05-23 LAB — CBC
HEMATOCRIT: 40.6 % (ref 39.0–52.0)
HEMOGLOBIN: 13 g/dL (ref 13.0–17.0)
MCH: 25.8 pg — AB (ref 26.0–34.0)
MCHC: 32 g/dL (ref 30.0–36.0)
MCV: 80.7 fL (ref 78.0–100.0)
Platelets: 312 10*3/uL (ref 150–400)
RBC: 5.03 MIL/uL (ref 4.22–5.81)
RDW: 14.1 % (ref 11.5–15.5)
WBC: 5.4 10*3/uL (ref 4.0–10.5)

## 2014-05-23 LAB — URINALYSIS, ROUTINE W REFLEX MICROSCOPIC
BILIRUBIN URINE: NEGATIVE
GLUCOSE, UA: NEGATIVE mg/dL
HGB URINE DIPSTICK: NEGATIVE
KETONES UR: NEGATIVE mg/dL
Leukocytes, UA: NEGATIVE
Nitrite: NEGATIVE
PROTEIN: NEGATIVE mg/dL
Specific Gravity, Urine: 1.01 (ref 1.005–1.030)
Urobilinogen, UA: 1 mg/dL (ref 0.0–1.0)
pH: 7.5 (ref 5.0–8.0)

## 2014-05-23 LAB — COMPREHENSIVE METABOLIC PANEL
ALK PHOS: 61 U/L (ref 39–117)
ALT: 11 U/L (ref 0–53)
AST: 15 U/L (ref 0–37)
Albumin: 3.2 g/dL — ABNORMAL LOW (ref 3.5–5.2)
Anion gap: 11 (ref 5–15)
BUN: 5 mg/dL — ABNORMAL LOW (ref 6–23)
CO2: 29 meq/L (ref 19–32)
Calcium: 9.4 mg/dL (ref 8.4–10.5)
Chloride: 90 mEq/L — ABNORMAL LOW (ref 96–112)
Creatinine, Ser: 0.44 mg/dL — ABNORMAL LOW (ref 0.50–1.35)
GLUCOSE: 169 mg/dL — AB (ref 70–99)
POTASSIUM: 3.8 meq/L (ref 3.7–5.3)
SODIUM: 130 meq/L — AB (ref 137–147)
Total Bilirubin: 0.4 mg/dL (ref 0.3–1.2)
Total Protein: 7 g/dL (ref 6.0–8.3)

## 2014-05-23 LAB — LIPASE, BLOOD: Lipase: 20 U/L (ref 11–59)

## 2014-05-23 MED ORDER — SODIUM CHLORIDE 0.9 % IV BOLUS (SEPSIS)
500.0000 mL | Freq: Once | INTRAVENOUS | Status: AC
  Administered 2014-05-23: 500 mL via INTRAVENOUS

## 2014-05-23 MED ORDER — HYDROMORPHONE HCL 1 MG/ML IJ SOLN
1.0000 mg | Freq: Once | INTRAMUSCULAR | Status: AC
  Administered 2014-05-23: 1 mg via INTRAVENOUS
  Filled 2014-05-23: qty 1

## 2014-05-23 NOTE — ED Notes (Signed)
[facility] CALLED FOR TRANSPORT. 

## 2014-05-23 NOTE — Discharge Instructions (Signed)

## 2014-05-23 NOTE — ED Notes (Signed)
Bed: WHALD Expected date:  Expected time:  Means of arrival:  Comments: 

## 2014-05-23 NOTE — Telephone Encounter (Signed)
Nyron Mozer J. Lucretia RoersWood, RN, BSN, [employer] [phone] Spoke with pt at bedside regarding discharge planning for [facility]. Offered pt list of home health agencies to choose from.  [facility]. [facility]  No DME needs identified at this time.

## 2014-05-23 NOTE — Discharge Planning (Signed)
Morrie Daywalt J. Lucretia RoersWood, RN, BSN, [employer] [phone] Spoke with pt at via telephone regarding discharge planning for [facility]. Offered pt list of home health agencies to choose from.  [facility] chose [facility]. [facility].  No DME needs identified at this time.

## 2014-05-23 NOTE — ED Notes (Signed)
Per EMS- Patient c/o left flank pain x 2 weeks. Patient reported that he had black urine.

## 2014-05-23 NOTE — ED Notes (Signed)
Bed: WA13 Expected date:  Expected time:  Means of arrival:  Comments: [facility] 
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
## 2015-03-10 DIAGNOSIS — R69 Illness, unspecified: Secondary | ICD-10-CM | POA: Diagnosis not present

## 2015-04-08 DIAGNOSIS — E1165 Type 2 diabetes mellitus with hyperglycemia: Secondary | ICD-10-CM | POA: Diagnosis not present

## 2015-04-08 DIAGNOSIS — I10 Essential (primary) hypertension: Secondary | ICD-10-CM | POA: Diagnosis not present

## 2015-04-08 DIAGNOSIS — Z794 Long term (current) use of insulin: Secondary | ICD-10-CM | POA: Diagnosis not present

## 2015-04-25 DIAGNOSIS — R69 Illness, unspecified: Secondary | ICD-10-CM | POA: Diagnosis not present

## 2015-05-12 DIAGNOSIS — E119 Type 2 diabetes mellitus without complications: Secondary | ICD-10-CM | POA: Diagnosis not present

## 2015-05-12 DIAGNOSIS — H2513 Age-related nuclear cataract, bilateral: Secondary | ICD-10-CM | POA: Diagnosis not present

## 2015-05-12 DIAGNOSIS — H35033 Hypertensive retinopathy, bilateral: Secondary | ICD-10-CM | POA: Diagnosis not present

## 2015-06-12 DIAGNOSIS — R69 Illness, unspecified: Secondary | ICD-10-CM | POA: Diagnosis not present

## 2015-07-08 DIAGNOSIS — Z794 Long term (current) use of insulin: Secondary | ICD-10-CM | POA: Diagnosis not present

## 2015-07-08 DIAGNOSIS — I10 Essential (primary) hypertension: Secondary | ICD-10-CM | POA: Diagnosis not present

## 2015-07-08 DIAGNOSIS — E782 Mixed hyperlipidemia: Secondary | ICD-10-CM | POA: Diagnosis not present

## 2015-07-08 DIAGNOSIS — E1165 Type 2 diabetes mellitus with hyperglycemia: Secondary | ICD-10-CM | POA: Diagnosis not present

## 2015-09-10 DIAGNOSIS — R69 Illness, unspecified: Secondary | ICD-10-CM | POA: Diagnosis not present

## 2015-10-07 DIAGNOSIS — E1165 Type 2 diabetes mellitus with hyperglycemia: Secondary | ICD-10-CM | POA: Diagnosis not present

## 2015-10-07 DIAGNOSIS — E782 Mixed hyperlipidemia: Secondary | ICD-10-CM | POA: Diagnosis not present

## 2015-10-07 DIAGNOSIS — I10 Essential (primary) hypertension: Secondary | ICD-10-CM | POA: Diagnosis not present

## 2015-10-07 DIAGNOSIS — Z794 Long term (current) use of insulin: Secondary | ICD-10-CM | POA: Diagnosis not present

## 2015-10-27 DIAGNOSIS — R69 Illness, unspecified: Secondary | ICD-10-CM | POA: Diagnosis not present

## 2015-12-12 DIAGNOSIS — R69 Illness, unspecified: Secondary | ICD-10-CM | POA: Diagnosis not present

## 2016-02-03 DIAGNOSIS — R69 Illness, unspecified: Secondary | ICD-10-CM | POA: Diagnosis not present

## 2016-02-11 DIAGNOSIS — E1165 Type 2 diabetes mellitus with hyperglycemia: Secondary | ICD-10-CM | POA: Diagnosis not present

## 2016-02-11 DIAGNOSIS — I10 Essential (primary) hypertension: Secondary | ICD-10-CM | POA: Diagnosis not present

## 2016-02-11 DIAGNOSIS — Z0000 Encounter for general adult medical examination without abnormal findings: Secondary | ICD-10-CM | POA: Diagnosis not present

## 2016-02-11 DIAGNOSIS — Z794 Long term (current) use of insulin: Secondary | ICD-10-CM | POA: Diagnosis not present

## 2016-02-11 DIAGNOSIS — G14 Postpolio syndrome: Secondary | ICD-10-CM | POA: Diagnosis not present

## 2016-02-11 DIAGNOSIS — Z87891 Personal history of nicotine dependence: Secondary | ICD-10-CM | POA: Diagnosis not present

## 2016-02-11 DIAGNOSIS — E782 Mixed hyperlipidemia: Secondary | ICD-10-CM | POA: Diagnosis not present

## 2016-03-22 DIAGNOSIS — R69 Illness, unspecified: Secondary | ICD-10-CM | POA: Diagnosis not present

## 2016-04-06 ENCOUNTER — Emergency Department (HOSPITAL_COMMUNITY)
Admission: EM | Admit: 2016-04-06 | Discharge: 2016-04-06 | Disposition: A | Discharge status: Home / Self Care | Payer: Medicare HMO | Attending: Emergency Medicine | Admitting: Emergency Medicine

## 2016-04-06 ENCOUNTER — Emergency Department (HOSPITAL_COMMUNITY): Payer: Medicare HMO

## 2016-04-06 ENCOUNTER — Encounter (HOSPITAL_COMMUNITY): Payer: Self-pay

## 2016-04-06 DIAGNOSIS — Z794 Long term (current) use of insulin: Secondary | ICD-10-CM | POA: Diagnosis not present

## 2016-04-06 DIAGNOSIS — M4802 Spinal stenosis, cervical region: Secondary | ICD-10-CM

## 2016-04-06 DIAGNOSIS — M62838 Other muscle spasm: Secondary | ICD-10-CM | POA: Diagnosis not present

## 2016-04-06 DIAGNOSIS — E119 Type 2 diabetes mellitus without complications: Secondary | ICD-10-CM | POA: Insufficient documentation

## 2016-04-06 DIAGNOSIS — I6789 Other cerebrovascular disease: Secondary | ICD-10-CM | POA: Diagnosis not present

## 2016-04-06 DIAGNOSIS — I10 Essential (primary) hypertension: Secondary | ICD-10-CM | POA: Insufficient documentation

## 2016-04-06 DIAGNOSIS — Z87891 Personal history of nicotine dependence: Secondary | ICD-10-CM | POA: Diagnosis not present

## 2016-04-06 DIAGNOSIS — R531 Weakness: Secondary | ICD-10-CM | POA: Diagnosis not present

## 2016-04-06 DIAGNOSIS — R42 Dizziness and giddiness: Secondary | ICD-10-CM | POA: Diagnosis not present

## 2016-04-06 DIAGNOSIS — Z7984 Long term (current) use of oral hypoglycemic drugs: Secondary | ICD-10-CM | POA: Insufficient documentation

## 2016-04-06 LAB — URINALYSIS, ROUTINE W REFLEX MICROSCOPIC
Bilirubin Urine: NEGATIVE
GLUCOSE, UA: NEGATIVE mg/dL
HGB URINE DIPSTICK: NEGATIVE
KETONES UR: 15 mg/dL — AB
LEUKOCYTES UA: NEGATIVE
Nitrite: NEGATIVE
PROTEIN: NEGATIVE mg/dL
Specific Gravity, Urine: 1.011 (ref 1.005–1.030)
pH: 7 (ref 5.0–8.0)

## 2016-04-06 LAB — CBC
HCT: 42.1 % (ref 39.0–52.0)
Hemoglobin: 14.5 g/dL (ref 13.0–17.0)
MCH: 28.7 pg (ref 26.0–34.0)
MCHC: 34.4 g/dL (ref 30.0–36.0)
MCV: 83.2 fL (ref 78.0–100.0)
PLATELETS: 299 10*3/uL (ref 150–400)
RBC: 5.06 MIL/uL (ref 4.22–5.81)
RDW: 13.2 % (ref 11.5–15.5)
WBC: 5.4 10*3/uL (ref 4.0–10.5)

## 2016-04-06 LAB — BASIC METABOLIC PANEL
Anion gap: 9 (ref 5–15)
BUN: 8 mg/dL (ref 6–20)
CALCIUM: 9.5 mg/dL (ref 8.9–10.3)
CHLORIDE: 95 mmol/L — AB (ref 101–111)
CO2: 28 mmol/L (ref 22–32)
CREATININE: 0.45 mg/dL — AB (ref 0.61–1.24)
GFR calc non Af Amer: >60 mL/min (ref >60)
Glucose, Bld: 158 mg/dL — ABNORMAL HIGH (ref 65–99)
Potassium: 3.9 mmol/L (ref 3.5–5.1)
SODIUM: 132 mmol/L — AB (ref 135–145)

## 2016-04-06 LAB — CBG MONITORING, ED: GLUCOSE-CAPILLARY: 157 mg/dL — AB (ref 65–99)

## 2016-04-06 MED ORDER — DEXAMETHASONE SODIUM PHOSPHATE 10 MG/ML IJ SOLN
10.0000 mg | Freq: Once | INTRAMUSCULAR | Status: AC
  Administered 2016-04-06: 10 mg via INTRAMUSCULAR
  Filled 2016-04-06: qty 1

## 2016-04-06 MED ORDER — IBUPROFEN 400 MG PO TABS: 400.0000 mg | ORAL_TABLET | Freq: Four times a day (QID) | ORAL | 0 refills | Status: DC | PRN

## 2016-04-06 NOTE — ED Notes (Signed)
Pt reports weakness when he woke up this am.  States he also had "queasy" head.  Pt has hx of Polio and is bed and WC bound.  Pt also reports bila arms and leg tingling which is now resolved.  Pt is A&Ox 4.  No obvious neuro deficits noted at this time.

## 2016-04-06 NOTE — ED Provider Notes (Signed)
[facility]-EMER[facility]ENCY DEPT Provider Note   CSN: [id number] Arrival date & time: 04/06/16  1400     History   Chief Complaint Chief Complaint  Patient presents with  . Weakness    pt states tingling and weakness for the past two days this morning the tingling sensation increased     HPI Timothy Gross is a 75 y.o. male.  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 He states the weakness and tingling has been chronic (patient is unsure of exact cause) for several years, but sensation was worse this morning when he woke up, prompting him to come to ED. He states his blood sugars have been well controlled with CBG of 90 this morning. He denies any headache, dizziness, vision changes, slurred speech, or syncope. He also denies any chest pain, palpitations, or shortness of breath. He is bed bound due to post-polio syndrome, and denies any recent trauma or chronic neck and back pain.      Past Medical History:  Diagnosis Date  . Cervical myelopathy (HCC)    Hx of  . Diabetes mellitus   . GERD (gastroesophageal reflux disease)   . H/O acute alcoholic hepatitis   . History of post-polio syndrome    right sided weakness  . History of UTI   . Hyperlipidemia   . Hypertension   . Iron deficiency anemia   . Right patella fracture   . Right sided weakness     Patient Active Problem List   Diagnosis Date Noted  . Symptomatic cholelithiasis 09/09/2011  . Constipation 09/09/2011  . Hyponatremia 07/28/2011  . Elevated LFTs 07/28/2011  . Abdominal pain 07/28/2011  . Generalized weakness 07/28/2011  . Dehydration 07/28/2011  . Hypertension   . History of UTI   . Iron deficiency anemia   . DM (diabetes mellitus), type 2, uncontrolled (HCC)     . Hyperlipidemia   . GERD (gastroesophageal reflux disease)   . History of post-polio syndrome     Past Surgical History:  Procedure Laterality Date  . CERVICAL FUSION  04/16/2003  . CHOLECYSTECTOMY  09/11/2011   Procedure: LAPAROSCOPIC CHOLECYSTECTOMY WITH INTRAOPERATIVE CHOLANGIOGRAM;  Surgeon: Atilano InaEric M Wilson, MD,FACS;  Location: [facility] OR;  Service: General;  Laterality: N/A;  . ESOPHAGOGASTRODUODENOSCOPY  07/28/2011   Procedure: ESOPHAGOGASTRODUODENOSCOPY (EGD);  Surgeon: Louis Meckelobert D Kaplan, MD;  [facility];  Service: Endoscopy;  Laterality: N/A;  . ORIF Right Femur Fracture    . radioactive iodine treatment  1984       Home Medications    Prior to Admission medications   Medication Sig Start Date End Date Taking? Authorizing Provider  benazepril (LOTENSIN) 40 MG tablet Take 40 mg by mouth daily.     Historical Provider, MD  cholestyramine Lanetta Inch(QUESTRAN) 4 G packet Take 1 packet by mouth 3 (three) times daily with meals. 10/11/11 10/10/12  Chevis PrettyPaul Toth III, MD  dextran 70-hypromellose (TEARS RENEWED) ophthalmic solution Place 1 drop into both eyes 3 (three) times daily as needed. For dry eyes    Historical Provider, MD  hydrochlorothiazide (HYDRODIURIL) 25 MG tablet Take 25 mg by mouth daily.    Historical Provider, MD  HYDROcodone-acetaminophen (NORCO) 5-325 MG per tablet Take 1-2 tablets by mouth every 4 (four) hours as needed. For pain 09/15/11   Tresa EndoKelly  Earl Gala, PA-C  insulin glargine (LANTUS) 100 UNIT/ML injection Inject 20-30 Units into the skin at bedtime. Per sliding scale    Historical Provider, MD  metFORMIN (GLUCOPHAGE) 500 MG tablet Take 1,000 mg by mouth 2 (two) times daily with a meal.    Historical Provider, MD  metoCLOPramide (REGLAN) 10 MG tablet Take 10 mg by mouth every 6 (six) hours as needed. For nausea 09/05/11 09/15/11  Dione Booze, MD  metoprolol tartrate (LOPRESSOR) 25 MG tablet Take 25 mg by mouth daily.    Historical Provider, MD  Multiple Vitamin (MULITIVITAMIN WITH  MINERALS) TABS Take 1 tablet by mouth daily.    Historical Provider, MD  omeprazole (PRILOSEC) 40 MG capsule Take 40 mg by mouth 2 (two) times daily.    Historical Provider, MD  simvastatin (ZOCOR) 40 MG tablet Take 40 mg by mouth every evening.    Historical Provider, MD    Family History Family History  Problem Relation Age of Onset  . Diabetes Mother   . Diabetes Father   . Diabetes Brother     Social History Social History  Substance Use Topics  . Smoking status: Former Smoker    Quit date: 07/27/1973  . Smokeless tobacco: Never Used  . Alcohol use No     Comment: Quit around 2001     Allergies   Review of patient's allergies indicates no known allergies.   Review of Systems Review of Systems  Constitutional: Negative for chills, fatigue and fever.  HENT: Negative for ear pain, sore throat and tinnitus.   Eyes: Negative for pain and visual disturbance.  Respiratory: Negative for cough and shortness of breath.   Cardiovascular: Negative for chest pain, palpitations and leg swelling.  Gastrointestinal: Negative for abdominal pain, blood in stool, nausea and vomiting.  Genitourinary: Negative for dysuria, flank pain and hematuria.  Musculoskeletal: Negative for back pain and neck pain.  Skin: Negative for color change and rash.  Neurological: Positive for weakness (chronic BLE and RUE weakness). Negative for dizziness, seizures, syncope, numbness and headaches.     Physical Exam Updated Vital Signs BP 120/78 (BP Location: Right Arm)   Pulse 85   Temp 98 F (36.7 C) (Oral)   Resp 20   Ht 5\' 10"  (1.778 m)   Wt 102.1 kg   SpO2 97%   BMI 32.28 kg/m   Physical Exam  Constitutional: He appears well-developed and well-nourished.  Elderly male, lying comfortably in bed, alert, in no acute distress.  HENT:  Head: Normocephalic and atraumatic.  Mouth/Throat: Oropharynx is clear and moist.  Eyes: Conjunctivae and EOM are normal. Pupils are equal, round, and reactive  to light. No scleral icterus.  Neck: Normal range of motion. Neck supple.  No midline cervical tenderness, crepitus, or deformity. No TTP of paraspinal or trapezius musculature.  Cardiovascular: Normal rate, regular rhythm, normal heart sounds and intact distal pulses.   Pulmonary/Chest: Effort normal and breath sounds normal. No respiratory distress.  Abdominal: Soft. Bowel sounds are normal. There is no tenderness.  Musculoskeletal: Normal range of motion. He exhibits no edema or tenderness.  FROM LUE without obvious deformity, crepitus, or impingement signs. Negative left straight leg raise.  Neurological: He is alert.  Speech is clear and goal oriented, follows commands. Cranial nerves III - XII without deficit, no facial droop. Sensation normal to light and sharp touch BUE and BLE. Strength deficits noted to RUE and BLE, but moves extremities without ataxia and coordination intact.  Skin: Skin is warm and dry.  Psychiatric: He has a normal mood and affect.  Nursing note and vitals reviewed.   ED Treatments / Results  Labs (all labs ordered are listed, but only abnormal results are displayed) Labs Reviewed  CBC  BASIC METABOLIC PANEL  URINALYSIS, ROUTINE W REFLEX MICROSCOPIC (NOT [facility])  CBG MONITORING, ED    EKG  EKG Interpretation None       Radiology Mr Cervical Spine Wo Contrast  Result Date: 04/06/2016 CLINICAL DATA:  Weakness and extremity tingling EXAM: MRI CERVICAL SPINE WITHOUT CONTRAST TECHNIQUE: Multiplanar, multisequence MR imaging of the cervical spine was performed. No intravenous contrast was administered. COMPARISON:  None. FINDINGS: Alignment: There is straightening of the normal cervical lordosis. Vertebrae: There is fusion of the C4 and C5 vertebral bodies. There is posterior fusion hardware extending from C3-C6. Cord: There is focal hyperintense T2 weighted signal within the spinal cord at the C4-5 level. Posterior Fossa, vertebral arteries, paraspinal  tissues: Visualized posterior fossa is normal. Vertebral artery flow voids are preserved. Normal visualized paraspinal soft tissues. Disc levels: C1-C2: Moderate degenerative change. C2-C3: Medium-sized central disc protrusion indenting the ventral aspect of the cord. Moderate spinal canal stenosis. No neuroforaminal stenosis. C3-C4: Central/left subarticular disc protrusion narrowing the ventral thecal space and left lateral recess. No spinal canal stenosis. Mild left neuroforaminal stenosis. C4-C5: Small central disc osteophyte complex. Postfusion changes. No spinal canal stenosis. No neuroforaminal stenosis. C5-C6: Small disc osteophyte complex. Postfusion changes. No spinal canal stenosis. No neuroforaminal stenosis. C6-C7: Small disc osteophyte complex. Mild spinal canal stenosis. No neuroforaminal stenosis. C7-T1: Normal disc space and facet joints. No spinal canal stenosis. No neuroforaminal stenosis. The T1-T2 level is visualized on sagittal images only. There is no significant disc herniation, spinal canal stenosis or neuroforaminal narrowing. IMPRESSION: 1. Moderate spinal canal stenosis at C2-C3 secondary to medium sized central disc protrusion that indents the ventral aspect of the spinal cord. 2. Focal T2 hyperintensity within the spinal cord at the C4-5 level, most consistent with chronic myelomalacia. No current spinal canal stenosis at this level. 3. Mild left C3-4 foraminal stenosis secondary to left central/subarticular disc protrusion. 4. Mild C6-7 spinal canal stenosis secondary to disc osteophyte complex. Electronically Signed   By: Deatra RobinsonKevin  Herman M.D.   On: 04/06/2016 18:50    Procedures Procedures (including critical care time)  Medications Ordered in ED Medications  dexamethasone (DECADRON) injection 10 mg (not administered)     Initial Impression / Assessment and Plan / ED Course  I have reviewed the triage vital signs and the nursing notes.  Pertinent labs & imaging results  that were available during my care of the patient were reviewed by me and considered in my medical decision making (see chart for details).  Clinical Course   Patient is [age] yo M presenting with "tingling" sensation in left arm and leg that has been worsening since yesterday. Reports weakness and tingling are chronic due to cervical myelopathy and post-polio syndrome, but sensation worse this morning. Neuro exam unremarkable from baseline, with no evidence of left shoulder impingement or lower extremity sciatica. CBC, BMP, CBG, and urinalysis unremarkable from baseline. Neurology consult made, who recommended MR cervical spine w/o contrast. MR shows moderate spinal stenosis at C2-C3, and mild stenosis at C3-C4 and C6-C7, but no acute pathology warranting admission or further workup. Reviewed findings with attending physician, Dr. Bethann BerkshireJoseph Zammit, who determined patient is stable for d/c home with IM Decadron injection and prescription for ibuprofen. Patient given referral to f/u with [facility] and return  precautions for new or worsening neurologic symptoms.  Final Clinical Impressions(s) / ED Diagnoses   Final diagnoses:  Cervical spinal stenosis    New Prescriptions New Prescriptions   IBUPROFEN (ADVIL,MOTRIN) 400 MG TABLET    Take 1 tablet (400 mg total) by mouth every 6 (six) hours as needed.     Ivonne Andrew [place], [place] 04/06/16 2022    Lavera Guise, MD 04/06/16 2055

## 2016-04-06 NOTE — ED Notes (Signed)
Patient transported to MRI 

## 2016-04-06 NOTE — ED Notes (Signed)
Pt's family Rowland Lathevan Jones [phone]

## 2016-04-06 NOTE — ED Notes (Signed)
Applied a Condom Cath to the pt.

## 2016-04-06 NOTE — ED Notes (Signed)
Pt's nephew is enroute to ED, as soon as he arrives and confirmation he has a key to the home [facility] can be called for transport.

## 2016-04-06 NOTE — ED Triage Notes (Signed)
Pt c/o weakness and tingling for the past two days that has come and gone at times but today when he woke up the tingling increased

## 2016-04-06 NOTE — ED Notes (Signed)
Gave pt a [place] sandwich meal and another cup of water.

## 2016-04-06 NOTE — ED Notes (Signed)
Pt's nephew at bedside, [facility] called, condom cath removed, pt redressed

## 2016-04-06 NOTE — ED Notes (Signed)
[facility] at bedside for transport.  

## 2016-04-06 NOTE — Discharge Instructions (Signed)
The "tingling" sensations in your left arm and leg are likely due to your cervical spine (neck bones) pressing on your spinal cord. This is called cervical stenosis. Please follow up with [facility] for reevaluation. Also, take ibuprofen as needed for relief of your symptoms. Lastly, the Decadron injection you received for relief of the spinal swelling can cause elevated blood sugars. Please don't be alarmed, but continue to check your blood sugar and take your diabetes medications as prescribed.

## 2016-04-06 NOTE — ED Notes (Signed)
Gave pt a cup of water and now is requesting food. Nurse notified

## 2016-05-07 DIAGNOSIS — R69 Illness, unspecified: Secondary | ICD-10-CM | POA: Diagnosis not present

## 2016-06-01 DIAGNOSIS — I10 Essential (primary) hypertension: Secondary | ICD-10-CM | POA: Diagnosis not present

## 2016-06-01 DIAGNOSIS — Z794 Long term (current) use of insulin: Secondary | ICD-10-CM | POA: Diagnosis not present

## 2016-06-01 DIAGNOSIS — E119 Type 2 diabetes mellitus without complications: Secondary | ICD-10-CM | POA: Diagnosis not present

## 2016-06-01 DIAGNOSIS — K219 Gastro-esophageal reflux disease without esophagitis: Secondary | ICD-10-CM | POA: Diagnosis not present

## 2016-06-01 DIAGNOSIS — E785 Hyperlipidemia, unspecified: Secondary | ICD-10-CM | POA: Diagnosis not present

## 2016-06-01 DIAGNOSIS — Z0000 Encounter for general adult medical examination without abnormal findings: Secondary | ICD-10-CM | POA: Diagnosis not present

## 2016-06-28 DIAGNOSIS — E119 Type 2 diabetes mellitus without complications: Secondary | ICD-10-CM | POA: Diagnosis not present

## 2016-06-28 DIAGNOSIS — H35033 Hypertensive retinopathy, bilateral: Secondary | ICD-10-CM | POA: Diagnosis not present

## 2016-06-28 DIAGNOSIS — H2513 Age-related nuclear cataract, bilateral: Secondary | ICD-10-CM | POA: Diagnosis not present

## 2016-08-04 ENCOUNTER — Inpatient Hospital Stay (HOSPITAL_COMMUNITY)
Admission: EM | Admit: 2016-08-04 | Discharge: 2016-08-08 | DRG: 309 | Disposition: A | Discharge status: Home Health | Payer: Medicare HMO | Attending: Family Medicine | Admitting: Family Medicine

## 2016-08-04 ENCOUNTER — Inpatient Hospital Stay (HOSPITAL_COMMUNITY): Payer: Medicare HMO

## 2016-08-04 ENCOUNTER — Emergency Department (HOSPITAL_COMMUNITY): Payer: Medicare HMO

## 2016-08-04 ENCOUNTER — Encounter (HOSPITAL_COMMUNITY): Payer: Self-pay | Admitting: Emergency Medicine

## 2016-08-04 DIAGNOSIS — Z794 Long term (current) use of insulin: Secondary | ICD-10-CM | POA: Diagnosis not present

## 2016-08-04 DIAGNOSIS — E1165 Type 2 diabetes mellitus with hyperglycemia: Secondary | ICD-10-CM | POA: Diagnosis not present

## 2016-08-04 DIAGNOSIS — E782 Mixed hyperlipidemia: Secondary | ICD-10-CM | POA: Diagnosis not present

## 2016-08-04 DIAGNOSIS — A084 Viral intestinal infection, unspecified: Secondary | ICD-10-CM | POA: Diagnosis present

## 2016-08-04 DIAGNOSIS — I480 Paroxysmal atrial fibrillation: Principal | ICD-10-CM | POA: Diagnosis present

## 2016-08-04 DIAGNOSIS — R739 Hyperglycemia, unspecified: Secondary | ICD-10-CM | POA: Diagnosis not present

## 2016-08-04 DIAGNOSIS — Z888 Allergy status to other drugs, medicaments and biological substances status: Secondary | ICD-10-CM | POA: Diagnosis not present

## 2016-08-04 DIAGNOSIS — E860 Dehydration: Secondary | ICD-10-CM | POA: Diagnosis present

## 2016-08-04 DIAGNOSIS — Z833 Family history of diabetes mellitus: Secondary | ICD-10-CM | POA: Diagnosis not present

## 2016-08-04 DIAGNOSIS — I959 Hypotension, unspecified: Secondary | ICD-10-CM | POA: Diagnosis not present

## 2016-08-04 DIAGNOSIS — R269 Unspecified abnormalities of gait and mobility: Secondary | ICD-10-CM | POA: Diagnosis present

## 2016-08-04 DIAGNOSIS — G14 Postpolio syndrome: Secondary | ICD-10-CM | POA: Diagnosis present

## 2016-08-04 DIAGNOSIS — Z9049 Acquired absence of other specified parts of digestive tract: Secondary | ICD-10-CM | POA: Diagnosis not present

## 2016-08-04 DIAGNOSIS — D509 Iron deficiency anemia, unspecified: Secondary | ICD-10-CM | POA: Diagnosis present

## 2016-08-04 DIAGNOSIS — Z7982 Long term (current) use of aspirin: Secondary | ICD-10-CM

## 2016-08-04 DIAGNOSIS — I10 Essential (primary) hypertension: Secondary | ICD-10-CM | POA: Diagnosis present

## 2016-08-04 DIAGNOSIS — K219 Gastro-esophageal reflux disease without esophagitis: Secondary | ICD-10-CM | POA: Diagnosis present

## 2016-08-04 DIAGNOSIS — Z87891 Personal history of nicotine dependence: Secondary | ICD-10-CM | POA: Diagnosis not present

## 2016-08-04 DIAGNOSIS — E440 Moderate protein-calorie malnutrition: Secondary | ICD-10-CM | POA: Diagnosis present

## 2016-08-04 DIAGNOSIS — E785 Hyperlipidemia, unspecified: Secondary | ICD-10-CM | POA: Diagnosis present

## 2016-08-04 DIAGNOSIS — E876 Hypokalemia: Secondary | ICD-10-CM | POA: Diagnosis present

## 2016-08-04 DIAGNOSIS — I519 Heart disease, unspecified: Secondary | ICD-10-CM | POA: Diagnosis not present

## 2016-08-04 DIAGNOSIS — IMO0002 Reserved for concepts with insufficient information to code with codable children: Secondary | ICD-10-CM | POA: Diagnosis present

## 2016-08-04 DIAGNOSIS — I517 Cardiomegaly: Secondary | ICD-10-CM | POA: Diagnosis present

## 2016-08-04 DIAGNOSIS — R531 Weakness: Secondary | ICD-10-CM | POA: Diagnosis not present

## 2016-08-04 DIAGNOSIS — R109 Unspecified abdominal pain: Secondary | ICD-10-CM | POA: Diagnosis not present

## 2016-08-04 DIAGNOSIS — I4891 Unspecified atrial fibrillation: Secondary | ICD-10-CM | POA: Diagnosis present

## 2016-08-04 DIAGNOSIS — E1065 Type 1 diabetes mellitus with hyperglycemia: Secondary | ICD-10-CM | POA: Diagnosis not present

## 2016-08-04 LAB — CBC WITH DIFFERENTIAL/PLATELET
BASOS ABS: 0 10*3/uL (ref 0.0–0.1)
Basophils Relative: 0 %
Eosinophils Absolute: 0.2 10*3/uL (ref 0.0–0.7)
Eosinophils Relative: 3 %
HEMATOCRIT: 44.1 % (ref 39.0–52.0)
HEMOGLOBIN: 14.9 g/dL (ref 13.0–17.0)
LYMPHS PCT: 32 %
Lymphs Abs: 1.8 10*3/uL (ref 0.7–4.0)
MCH: 28.2 pg (ref 26.0–34.0)
MCHC: 33.8 g/dL (ref 30.0–36.0)
MCV: 83.5 fL (ref 78.0–100.0)
MONO ABS: 0.4 10*3/uL (ref 0.1–1.0)
Monocytes Relative: 7 %
NEUTROS ABS: 3.2 10*3/uL (ref 1.7–7.7)
Neutrophils Relative %: 58 %
Platelets: 332 10*3/uL (ref 150–400)
RBC: 5.28 MIL/uL (ref 4.22–5.81)
RDW: 13.1 % (ref 11.5–15.5)
WBC: 5.7 10*3/uL (ref 4.0–10.5)

## 2016-08-04 LAB — MAGNESIUM: Magnesium: 1.2 mg/dL — ABNORMAL LOW (ref 1.7–2.4)

## 2016-08-04 LAB — COMPREHENSIVE METABOLIC PANEL
ALK PHOS: 55 U/L (ref 38–126)
ALT: 16 U/L — ABNORMAL LOW (ref 17–63)
AST: 21 U/L (ref 15–41)
Albumin: 3.8 g/dL (ref 3.5–5.0)
Anion gap: 10 (ref 5–15)
BILIRUBIN TOTAL: 0.5 mg/dL (ref 0.3–1.2)
BUN: 10 mg/dL (ref 6–20)
CALCIUM: 9.5 mg/dL (ref 8.9–10.3)
CO2: 32 mmol/L (ref 22–32)
CREATININE: 0.37 mg/dL — AB (ref 0.61–1.24)
Chloride: 92 mmol/L — ABNORMAL LOW (ref 101–111)
GFR calc Af Amer: >60 mL/min (ref >60)
Glucose, Bld: 188 mg/dL — ABNORMAL HIGH (ref 65–99)
POTASSIUM: 3.2 mmol/L — AB (ref 3.5–5.1)
Sodium: 134 mmol/L — ABNORMAL LOW (ref 135–145)
TOTAL PROTEIN: 8 g/dL (ref 6.5–8.1)

## 2016-08-04 LAB — CBC
HEMATOCRIT: 41.3 % (ref 39.0–52.0)
Hemoglobin: 13.9 g/dL (ref 13.0–17.0)
MCH: 28.1 pg (ref 26.0–34.0)
MCHC: 33.7 g/dL (ref 30.0–36.0)
MCV: 83.6 fL (ref 78.0–100.0)
PLATELETS: 293 10*3/uL (ref 150–400)
RBC: 4.94 MIL/uL (ref 4.22–5.81)
RDW: 13.1 % (ref 11.5–15.5)
WBC: 7.1 10*3/uL (ref 4.0–10.5)

## 2016-08-04 LAB — BLOOD GAS, VENOUS
Acid-Base Excess: 4.9 mmol/L — ABNORMAL HIGH (ref 0.0–2.0)
Bicarbonate: 32.1 mmol/L — ABNORMAL HIGH (ref 20.0–28.0)
O2 SAT: 49.9 %
PATIENT TEMPERATURE: 98.6
PH VEN: 7.358 (ref 7.250–7.430)
pCO2, Ven: 58.5 mmHg (ref 44.0–60.0)

## 2016-08-04 LAB — TROPONIN I: Troponin I: <0.03 ng/mL (ref <0.03)

## 2016-08-04 LAB — URINALYSIS, ROUTINE W REFLEX MICROSCOPIC
BILIRUBIN URINE: NEGATIVE
Glucose, UA: 150 mg/dL — AB
HGB URINE DIPSTICK: NEGATIVE
KETONES UR: 20 mg/dL — AB
Leukocytes, UA: NEGATIVE
Nitrite: NEGATIVE
PROTEIN: NEGATIVE mg/dL
Specific Gravity, Urine: 1.038 — ABNORMAL HIGH (ref 1.005–1.030)
pH: 6 (ref 5.0–8.0)

## 2016-08-04 LAB — T4, FREE: FREE T4: 1.2 ng/dL — AB (ref 0.61–1.12)

## 2016-08-04 LAB — TSH: TSH: 0.998 u[IU]/mL (ref 0.350–4.500)

## 2016-08-04 LAB — CBG MONITORING, ED: GLUCOSE-CAPILLARY: 293 mg/dL — AB (ref 65–99)

## 2016-08-04 LAB — MRSA PCR SCREENING: MRSA by PCR: NEGATIVE

## 2016-08-04 LAB — CREATININE, SERUM
CREATININE: 0.47 mg/dL — AB (ref 0.61–1.24)
GFR calc non Af Amer: >60 mL/min (ref >60)

## 2016-08-04 MED ORDER — POLYVINYL ALCOHOL 1.4 % OP SOLN
1.0000 [drp] | OPHTHALMIC | Status: DC | PRN
  Administered 2016-08-04 – 2016-08-07 (×3): 1 [drp] via OPHTHALMIC
  Filled 2016-08-04 (×2): qty 15

## 2016-08-04 MED ORDER — MAGNESIUM SULFATE 50 % IJ SOLN
3.0000 g | Freq: Once | INTRAMUSCULAR | Status: AC
  Administered 2016-08-04: 3 g via INTRAVENOUS
  Filled 2016-08-04: qty 6

## 2016-08-04 MED ORDER — IOPAMIDOL (ISOVUE-300) INJECTION 61%
INTRAVENOUS | Status: AC
  Filled 2016-08-04: qty 100

## 2016-08-04 MED ORDER — ONDANSETRON HCL 4 MG PO TABS: 4.0000 mg | ORAL_TABLET | Freq: Four times a day (QID) | ORAL | Status: DC | PRN

## 2016-08-04 MED ORDER — LINAGLIPTIN 5 MG PO TABS
5.0000 mg | ORAL_TABLET | Freq: Every day | ORAL | Status: DC
  Administered 2016-08-04: 5 mg via ORAL
  Filled 2016-08-04 (×2): qty 1

## 2016-08-04 MED ORDER — DILTIAZEM LOAD VIA INFUSION
10.0000 mg | Freq: Once | INTRAVENOUS | Status: AC
  Administered 2016-08-04: 10 mg via INTRAVENOUS
  Filled 2016-08-04: qty 10

## 2016-08-04 MED ORDER — ACETAMINOPHEN 325 MG PO TABS
650.0000 mg | ORAL_TABLET | Freq: Four times a day (QID) | ORAL | Status: DC | PRN
  Administered 2016-08-04 – 2016-08-07 (×8): 650 mg via ORAL
  Filled 2016-08-04 (×8): qty 2

## 2016-08-04 MED ORDER — ONDANSETRON HCL 4 MG/2ML IJ SOLN
4.0000 mg | Freq: Four times a day (QID) | INTRAMUSCULAR | Status: DC | PRN
  Administered 2016-08-05: 4 mg via INTRAVENOUS
  Filled 2016-08-04: qty 2

## 2016-08-04 MED ORDER — METOPROLOL TARTRATE 25 MG PO TABS
12.5000 mg | ORAL_TABLET | Freq: Two times a day (BID) | ORAL | Status: DC
  Administered 2016-08-04 – 2016-08-08 (×9): 12.5 mg via ORAL
  Filled 2016-08-04 (×9): qty 1

## 2016-08-04 MED ORDER — ONDANSETRON HCL 4 MG/2ML IJ SOLN
4.0000 mg | Freq: Once | INTRAMUSCULAR | Status: AC
  Administered 2016-08-04: 4 mg via INTRAVENOUS
  Filled 2016-08-04: qty 2

## 2016-08-04 MED ORDER — ACETAMINOPHEN 650 MG RE SUPP: 650.0000 mg | Freq: Four times a day (QID) | RECTAL | Status: DC | PRN

## 2016-08-04 MED ORDER — INSULIN DETEMIR 100 UNIT/ML ~~LOC~~ SOLN
30.0000 [IU] | Freq: Every day | SUBCUTANEOUS | Status: DC
  Administered 2016-08-04 – 2016-08-05 (×2): 30 [IU] via SUBCUTANEOUS
  Filled 2016-08-04 (×2): qty 0.3

## 2016-08-04 MED ORDER — IOPAMIDOL (ISOVUE-300) INJECTION 61%
100.0000 mL | Freq: Once | INTRAVENOUS | Status: AC | PRN
  Administered 2016-08-04: 100 mL via INTRAVENOUS

## 2016-08-04 MED ORDER — IOPAMIDOL (ISOVUE-300) INJECTION 61%
30.0000 mL | Freq: Once | INTRAVENOUS | Status: AC | PRN
  Administered 2016-08-04: 30 mL via ORAL

## 2016-08-04 MED ORDER — POTASSIUM CHLORIDE CRYS ER 20 MEQ PO TBCR
40.0000 meq | EXTENDED_RELEASE_TABLET | Freq: Once | ORAL | Status: AC
  Administered 2016-08-04: 40 meq via ORAL
  Filled 2016-08-04: qty 2

## 2016-08-04 MED ORDER — PANTOPRAZOLE SODIUM 40 MG PO TBEC
40.0000 mg | DELAYED_RELEASE_TABLET | Freq: Every day | ORAL | Status: DC
  Administered 2016-08-04 – 2016-08-08 (×5): 40 mg via ORAL
  Filled 2016-08-04 (×5): qty 1

## 2016-08-04 MED ORDER — SODIUM CHLORIDE 0.9 % IV BOLUS (SEPSIS): 500.0000 mL | Freq: Once | INTRAVENOUS | Status: DC

## 2016-08-04 MED ORDER — SODIUM CHLORIDE 0.9 % IV SOLN
INTRAVENOUS | Status: DC
  Administered 2016-08-04 – 2016-08-06 (×4): via INTRAVENOUS
  Filled 2016-08-04 (×9): qty 1000

## 2016-08-04 MED ORDER — SODIUM CHLORIDE 0.9 % IV BOLUS (SEPSIS)
1000.0000 mL | Freq: Once | INTRAVENOUS | Status: AC
  Administered 2016-08-04: 1000 mL via INTRAVENOUS

## 2016-08-04 MED ORDER — POLYETHYLENE GLYCOL 3350 17 G PO PACK: 17.0000 g | PACK | Freq: Every day | ORAL | Status: DC | PRN

## 2016-08-04 MED ORDER — DILTIAZEM HCL-DEXTROSE 100-5 MG/100ML-% IV SOLN (PREMIX)
5.0000 mg/h | INTRAVENOUS | Status: DC
  Administered 2016-08-04 – 2016-08-05 (×2): 5 mg/h via INTRAVENOUS
  Filled 2016-08-04 (×2): qty 100

## 2016-08-04 MED ORDER — ENOXAPARIN SODIUM 40 MG/0.4ML ~~LOC~~ SOLN
40.0000 mg | SUBCUTANEOUS | Status: DC
  Administered 2016-08-04 – 2016-08-07 (×4): 40 mg via SUBCUTANEOUS
  Filled 2016-08-04 (×4): qty 0.4

## 2016-08-04 MED ORDER — LEVALBUTEROL HCL 0.63 MG/3ML IN NEBU: 0.6300 mg | INHALATION_SOLUTION | Freq: Four times a day (QID) | RESPIRATORY_TRACT | Status: DC | PRN

## 2016-08-04 NOTE — ED Notes (Signed)
Patient remains on the bedpan. Patient stated he would call when finished.

## 2016-08-04 NOTE — ED Notes (Signed)
Patient transported to CT 

## 2016-08-04 NOTE — ED Triage Notes (Signed)
Patient blood sugar at 0200 was 470, patient took 35 units of lantus at home. Patient woke up and called EMS around 0500 because he didn't feel well. CBG 414 per EMS, and now 293 after arrival. Patient is wheelchair bound/ paraplegic.

## 2016-08-04 NOTE — ED Notes (Signed)
Bed: WA04 Expected date:  Expected time:  Means of arrival:  Comments: EMS  Hyperglycemia, paraplegic
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
## 2016-08-04 NOTE — Progress Notes (Signed)
RT informed RN at approx 0845 that VBG sample too small for analyzer to result.

## 2016-08-04 NOTE — ED Notes (Addendum)
Delay in transfer due to patient using the bedpan. ICU notified.

## 2016-08-04 NOTE — ED Notes (Signed)
Patients cousin is Rowland Lathevan Jones [phone]  Please call with patient update or when patient is able to go home.
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
## 2016-08-04 NOTE — ED Notes (Signed)
Attempted IV x 2.  LFA and RAC.  Not successful.

## 2016-08-05 ENCOUNTER — Other Ambulatory Visit (HOSPITAL_COMMUNITY): Payer: Medicare HMO

## 2016-08-05 DIAGNOSIS — E1165 Type 2 diabetes mellitus with hyperglycemia: Secondary | ICD-10-CM

## 2016-08-05 DIAGNOSIS — I4891 Unspecified atrial fibrillation: Secondary | ICD-10-CM

## 2016-08-05 DIAGNOSIS — I10 Essential (primary) hypertension: Secondary | ICD-10-CM

## 2016-08-05 LAB — COMPREHENSIVE METABOLIC PANEL
ALT: 11 U/L — ABNORMAL LOW (ref 17–63)
ANION GAP: 5 (ref 5–15)
AST: 16 U/L (ref 15–41)
Albumin: 2.6 g/dL — ABNORMAL LOW (ref 3.5–5.0)
Alkaline Phosphatase: 41 U/L (ref 38–126)
BUN: 8 mg/dL (ref 6–20)
CHLORIDE: 101 mmol/L (ref 101–111)
CO2: 26 mmol/L (ref 22–32)
CREATININE: 0.41 mg/dL — AB (ref 0.61–1.24)
Calcium: 8.1 mg/dL — ABNORMAL LOW (ref 8.9–10.3)
Glucose, Bld: 221 mg/dL — ABNORMAL HIGH (ref 65–99)
POTASSIUM: 4.7 mmol/L (ref 3.5–5.1)
Sodium: 132 mmol/L — ABNORMAL LOW (ref 135–145)
Total Bilirubin: 0.7 mg/dL (ref 0.3–1.2)
Total Protein: 5.7 g/dL — ABNORMAL LOW (ref 6.5–8.1)

## 2016-08-05 LAB — GASTROINTESTINAL PANEL BY PCR, STOOL (REPLACES STOOL CULTURE)
Adenovirus F40/41: NOT DETECTED
Astrovirus: NOT DETECTED
CRYPTOSPORIDIUM: NOT DETECTED
Campylobacter species: NOT DETECTED
Cyclospora cayetanensis: NOT DETECTED
ENTEROAGGREGATIVE E COLI (EAEC): NOT DETECTED
ENTEROPATHOGENIC E COLI (EPEC): NOT DETECTED
ENTEROTOXIGENIC E COLI (ETEC): NOT DETECTED
Entamoeba histolytica: NOT DETECTED
Giardia lamblia: NOT DETECTED
Norovirus GI/GII: NOT DETECTED
PLESIMONAS SHIGELLOIDES: NOT DETECTED
ROTAVIRUS A: NOT DETECTED
SALMONELLA SPECIES: NOT DETECTED
SHIGA LIKE TOXIN PRODUCING E COLI (STEC): NOT DETECTED
SHIGELLA/ENTEROINVASIVE E COLI (EIEC): NOT DETECTED
Sapovirus (I, II, IV, and V): NOT DETECTED
VIBRIO CHOLERAE: NOT DETECTED
VIBRIO SPECIES: NOT DETECTED
YERSINIA ENTEROCOLITICA: NOT DETECTED

## 2016-08-05 LAB — GLUCOSE, CAPILLARY
GLUCOSE-CAPILLARY: 176 mg/dL — AB (ref 65–99)
GLUCOSE-CAPILLARY: 147 mg/dL — AB (ref 65–99)
GLUCOSE-CAPILLARY: 136 mg/dL — AB (ref 65–99)
GLUCOSE-CAPILLARY: 249 mg/dL — AB (ref 65–99)
Glucose-Capillary: 151 mg/dL — ABNORMAL HIGH (ref 65–99)

## 2016-08-05 LAB — MAGNESIUM: MAGNESIUM: 2.1 mg/dL (ref 1.7–2.4)

## 2016-08-05 LAB — TROPONIN I

## 2016-08-05 MED ORDER — SUCRALFATE 1 G PO TABS
1.0000 g | ORAL_TABLET | Freq: Three times a day (TID) | ORAL | Status: DC
  Administered 2016-08-05 – 2016-08-08 (×13): 1 g via ORAL
  Filled 2016-08-05 (×13): qty 1

## 2016-08-05 MED ORDER — SODIUM CHLORIDE 0.9 % IV BOLUS (SEPSIS)
250.0000 mL | Freq: Once | INTRAVENOUS | Status: AC
  Administered 2016-08-05: 250 mL via INTRAVENOUS

## 2016-08-05 MED ORDER — INSULIN ASPART 100 UNIT/ML ~~LOC~~ SOLN
0.0000 [IU] | Freq: Three times a day (TID) | SUBCUTANEOUS | Status: DC
  Administered 2016-08-05: 2 [IU] via SUBCUTANEOUS
  Administered 2016-08-05 (×2): 1 [IU] via SUBCUTANEOUS
  Administered 2016-08-06: 3 [IU] via SUBCUTANEOUS
  Administered 2016-08-06: 6 [IU] via SUBCUTANEOUS
  Administered 2016-08-06: 3 [IU] via SUBCUTANEOUS
  Administered 2016-08-07: 2 [IU] via SUBCUTANEOUS
  Administered 2016-08-07: 3 [IU] via SUBCUTANEOUS
  Administered 2016-08-07 – 2016-08-08 (×2): 2 [IU] via SUBCUTANEOUS
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
## 2016-08-05 NOTE — Progress Notes (Signed)
Pt systolic blood pressure in the 80s. Provider contacted and ordered to stop Cardizem gtt and give normal saline bolus. Cardizem stopped and bolus given. Pt heart rate increased to the 130-140 and sustained in the 110s. Blood pressure increased to a systolic in the 100s. The provider ordered another bolus and to restart the cardizem upon bolus completion. Bolus has been given and Cardizem restarted. Heart rate now in the 80s. Will continue to monitor.

## 2016-08-05 NOTE — Progress Notes (Signed)
LCSW consulted for placement. Following patient and will await physical therapy recommendations for SNF.  Per notes, patient from home has family in home to help, but wanting additional help/home health if eligible.  Will continue to follow and assist with discharge needs when patient medically appropriate.  Deretha EmoryHannah Chania Kochanski LCSW, MSW Clinical Social Work: Optician, dispensingystem [employer] Coverage for :  [phone]

## 2016-08-06 ENCOUNTER — Inpatient Hospital Stay (HOSPITAL_COMMUNITY): Payer: Medicare HMO

## 2016-08-06 DIAGNOSIS — I4891 Unspecified atrial fibrillation: Secondary | ICD-10-CM

## 2016-08-06 LAB — COMPREHENSIVE METABOLIC PANEL
ALK PHOS: 45 U/L (ref 38–126)
ALT: 14 U/L — ABNORMAL LOW (ref 17–63)
ANION GAP: 4 — AB (ref 5–15)
AST: 17 U/L (ref 15–41)
Albumin: 3.1 g/dL — ABNORMAL LOW (ref 3.5–5.0)
BUN: 9 mg/dL (ref 6–20)
CO2: 25 mmol/L (ref 22–32)
Calcium: 8.5 mg/dL — ABNORMAL LOW (ref 8.9–10.3)
Chloride: 104 mmol/L (ref 101–111)
Creatinine, Ser: 0.56 mg/dL — ABNORMAL LOW (ref 0.61–1.24)
GFR calc non Af Amer: >60 mL/min (ref >60)
Glucose, Bld: 230 mg/dL — ABNORMAL HIGH (ref 65–99)
POTASSIUM: 4.8 mmol/L (ref 3.5–5.1)
SODIUM: 133 mmol/L — AB (ref 135–145)
TOTAL PROTEIN: 6.4 g/dL — AB (ref 6.5–8.1)
Total Bilirubin: 0.8 mg/dL (ref 0.3–1.2)

## 2016-08-06 LAB — MAGNESIUM: Magnesium: 1.8 mg/dL (ref 1.7–2.4)

## 2016-08-06 LAB — ECHOCARDIOGRAM COMPLETE
HEIGHTINCHES: 70 in
WEIGHTICAEL: 3372.16 [oz_av]

## 2016-08-06 LAB — GLUCOSE, CAPILLARY
GLUCOSE-CAPILLARY: 279 mg/dL — AB (ref 65–99)
Glucose-Capillary: 206 mg/dL — ABNORMAL HIGH (ref 65–99)
Glucose-Capillary: 225 mg/dL — ABNORMAL HIGH (ref 65–99)
Glucose-Capillary: 166 mg/dL — ABNORMAL HIGH (ref 65–99)

## 2016-08-06 MED ORDER — INSULIN DETEMIR 100 UNIT/ML ~~LOC~~ SOLN
35.0000 [IU] | Freq: Every day | SUBCUTANEOUS | Status: DC
  Administered 2016-08-06 – 2016-08-07 (×2): 35 [IU] via SUBCUTANEOUS
  Filled 2016-08-06 (×3): qty 0.35

## 2016-08-06 NOTE — Progress Notes (Deleted)
PT Cancellation Note  Patient Details Name: Timothy Gross MRN: [id number] DOB: 06-22-1940   Cancelled Treatment:    Reason Eval/Treat Not Completed: Patient at procedure or test/unavailable. Will check back later.   Rada HayHill, Emmilyn Crooke Elizabeth 08/06/2016, 11:36 AM Blanchard KelchKaren Taelon Bendorf PT [phone]

## 2016-08-06 NOTE — Evaluation (Signed)
Physical Therapy Evaluation Patient Details Name: Timothy Gross MRN: [id number] DOB: 08-25-1940 Today's Date: 08/06/2016   History of Present Illness  76 y.o. African American male with history of hypertension, GERD, hyperlipidemia, type 2 diabetes, history of polio with polio syndrome presents with generalized abdominal pain and elevated blood sugars. EKG shows new onset AFib with RVR, heart rate in the 140s. Labs otherwise show significant dehydration with hypokalemia  Clinical Impression  The patient is very motivated to participate in mobility. Proper equipment not available to attempt sliding board transfers to [facility]. The patient requesting increased caregiver support at home for ADL's and mobility. He relies on  Family to assist him into the WC/scooter and back to bed.     Follow Up Recommendations SNF;Home health PT (needs rehab tech or aide in the home)    Equipment Recommendations  None recommended by PT    Recommendations for Other Services OT consult     Precautions / Restrictions Precautions Precautions: Fall Precaution Comments: R>L leg paresis      Mobility  Bed Mobility Overal bed mobility: Needs Assistance Bed Mobility: Rolling;Supine to Sit;Sit to Supine Rolling: Mod assist   Supine to sit: Min assist Sit to supine: Min assist   General bed mobility comments: for lower trunk to turn to partially roll. Self assisting both legs to edge of bed. Lt eg caught on the bbed rail, assist to move it  over. Assist the right leg onto the bed./ Self assisting the left leg onto the bed  Transfers                 General transfer comment: uses sliding board. NT  Ambulation/Gait                Stairs            Wheelchair Mobility    Modified Rankin (Stroke Patients Only)       Balance Overall balance assessment: Needs assistance Sitting-balance support: Feet supported;No upper extremity supported Sitting balance-Leahy Scale: Fair Sitting  balance - Comments: able  to weight shift to each side and scoot to bed edge. sat x 30 minutes.                                      Pertinent Vitals/Pain Pain Assessment: Faces Faces Pain Scale: Hurts even more Pain Location: abdomen Pain Descriptors / Indicators: Cramping Pain Intervention(s): Patient requesting pain meds-RN notified;Monitored during session    Home Living Family/patient expects to be discharged to:: Private residence Living Arrangements: Alone Available Help at Discharge: Family;Available PRN/intermittently Type of Home: House Home Access: Ramped entrance     Home Layout: One level Home Equipment: Walker - 2 wheels;Bedside commode;Wheelchair - power;Wheelchair - manual Additional Comments: cousin availble intermittenetly.     Prior Function Level of Independence: Needs assistance   Gait / Transfers Assistance Needed: uses Hover round, sliding board transfers to [facility] and chair.  ADL's / Homemaking Assistance Needed: dresses and sponge bathes and toilets self. Reports that he transfers to [facility] with sliding board  Yet states that he requires someont to assit to scooter.         Hand Dominance        Extremity/Trunk Assessment   Upper Extremity Assessment Upper Extremity Assessment: Overall WFL for tasks assessed    Lower Extremity Assessment Lower Extremity Assessment: RLE deficits/detail;LLE deficits/detail RLE Deficits / Details: flaccid,moves leg with  hands LLE Deficits / Details: grossly 2 at hip and knee    Cervical / Trunk Assessment Cervical / Trunk Assessment: Normal  Communication      Cognition Arousal/Alertness: Awake/alert Behavior During Therapy: WFL for tasks assessed/performed Overall Cognitive Status: Within Functional Limits for tasks assessed                      General Comments      Exercises     Assessment/Plan    PT Assessment Patient needs continued PT services  PT Problem List Decreased  activity tolerance;Decreased mobility;Decreased knowledge of use of DME;Decreased strength       PT Treatment Interventions DME instruction;Functional mobility training;Therapeutic activities;Therapeutic exercise;Patient/family education    PT Goals (Current goals can be found in the Care Plan section)  Acute Rehab PT Goals Patient Stated Goal: to be able to take care of myself PT Goal Formulation: With patient Time For Goal Achievement: 08/20/16 Potential to Achieve Goals: Good    Frequency Min 3X/week   Barriers to discharge Decreased caregiver support      Co-evaluation               End of Session   Activity Tolerance: Patient tolerated treatment well Patient left: in bed;with call bell/phone within reach Nurse Communication: Mobility status PT Visit Diagnosis: Muscle weakness (generalized) (M62.81);Other symptoms and signs involving the nervous system (R29.898)         Time: [id number] PT Time Calculation (min) (ACUTE ONLY): 73 min   Charges:   PT Evaluation $PT Eval Moderate Complexity: 1 Procedure PT Treatments $Therapeutic Activity: 38-52 mins   PT G Codes:         Rada HayHill, Natisha Trzcinski Elizabeth 08/06/2016, 1:13 PM  Blanchard KelchKaren Jannessa Ogden PT [phone]
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
## 2016-08-06 NOTE — Progress Notes (Signed)
  Echocardiogram 2D Echocardiogram has been performed.  Arvil ChacoFoster, Erick Oxendine 08/06/2016, 11:08 AM

## 2016-08-06 NOTE — Clinical Social Work Note (Signed)
PT evaluated patient and recommended SNF placement. CSW visited with patient in room 1234 to discuss PT's recommendation and discharge disposition. He informed CSW that he was being being moved to 1432 but was agreeable to talking with CSW until they moved him.   Patient wants assistance in the home as he does not walk and this was discussed. Mr. Roxan HockeyRobinson reported that [place] in [place], where he lived [place] years, but [place], and when he moved here, his Medicaid was stopped and his pension and SSA was reduced. CSW informed patient that he would [place] [facility] on [address]. Patient reported that he has been to 3 SNF's in [place]; [facility] and he could not remember the name of the other facility. The conversation ended at this point as staff came to move him to 1432. Conversation will need to continue with patient to determine if he is willing to go to a facility for ST rehab.   Genelle BalVanessa Tangy Drozdowski, MSW, LCSW Licensed Clinical Social Worker Clinical Social Work Department [facility] [phone]
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
## 2016-08-07 DIAGNOSIS — E782 Mixed hyperlipidemia: Secondary | ICD-10-CM

## 2016-08-07 LAB — CBC
HEMATOCRIT: 35.2 % — AB (ref 39.0–52.0)
HEMOGLOBIN: 11.8 g/dL — AB (ref 13.0–17.0)
MCH: 27.7 pg (ref 26.0–34.0)
MCHC: 33.5 g/dL (ref 30.0–36.0)
MCV: 82.6 fL (ref 78.0–100.0)
Platelets: 283 10*3/uL (ref 150–400)
RBC: 4.26 MIL/uL (ref 4.22–5.81)
RDW: 13.2 % (ref 11.5–15.5)
WBC: 4.2 10*3/uL (ref 4.0–10.5)

## 2016-08-07 LAB — MAGNESIUM: Magnesium: 1.6 mg/dL — ABNORMAL LOW (ref 1.7–2.4)

## 2016-08-07 LAB — BASIC METABOLIC PANEL
ANION GAP: 5 (ref 5–15)
BUN: 10 mg/dL (ref 6–20)
CO2: 25 mmol/L (ref 22–32)
Calcium: 8.3 mg/dL — ABNORMAL LOW (ref 8.9–10.3)
Chloride: 100 mmol/L — ABNORMAL LOW (ref 101–111)
Creatinine, Ser: 0.4 mg/dL — ABNORMAL LOW (ref 0.61–1.24)
GFR calc non Af Amer: >60 mL/min (ref >60)
GLUCOSE: 210 mg/dL — AB (ref 65–99)
POTASSIUM: 4.4 mmol/L (ref 3.5–5.1)
Sodium: 130 mmol/L — ABNORMAL LOW (ref 135–145)

## 2016-08-07 LAB — GLUCOSE, CAPILLARY
GLUCOSE-CAPILLARY: 196 mg/dL — AB (ref 65–99)
GLUCOSE-CAPILLARY: 336 mg/dL — AB (ref 65–99)
Glucose-Capillary: 151 mg/dL — ABNORMAL HIGH (ref 65–99)
Glucose-Capillary: 230 mg/dL — ABNORMAL HIGH (ref 65–99)

## 2016-08-07 MED ORDER — MAGNESIUM OXIDE 400 (241.3 MG) MG PO TABS
400.0000 mg | ORAL_TABLET | Freq: Every day | ORAL | Status: DC
  Administered 2016-08-07 – 2016-08-08 (×2): 400 mg via ORAL
  Filled 2016-08-07 (×2): qty 1

## 2016-08-07 NOTE — Clinical Social Work Note (Signed)
Clinical Social Work Assessment  Patient Details  Name: Timothy Gross MRN: [id number] Date of Birth: 01-14-1941  Date of referral:  08/07/16               Reason for consult:  Facility Placement                Permission sought to share information with:    Permission granted to share information::  No (Patient refuses SNF)     Housing/Transportation Living arrangements for the past 2 months:  Single Family Home Source of Information:  Patient Patient Interpreter Needed:  None Criminal Activity/Legal Involvement Pertinent to Current Situation/Hospitalization:  No - Comment as needed Significant Relationships:  Other Family Members Barrister's clerk) Lives with:  Self Do you feel safe going back to the place where you live?  Yes Need for family participation in patient care:  No (Coment)  Care giving concerns:  Lives home alone- limited support from cousin who "does the best he can."  Wants help at home.   Social Worker assessment / plan:  CSW met with patient to discuss PT's recommendation for SNF placement.  Patient emphatically stated NO when this was addressed. States he has been in multiple SNFs in the the past and refuses to return.  He lives alone and has been wheelchair bound for about 3 years. Can stand and pivot. He states he has an IT trainer and manual wheelchair at home. His cousin tries to help him but is overwhelmed with his own family, work Social research officer, government.  Pt receives MOW's and feels he would be able to manage if he had a few hours of help at home a day. He does not have Medicaid.  Patient given information re: [place] and how to apply for Medicaid. He will ask his cousin to do this.  Patient is also agreeable to home health- RN, PT and SW. Will notify RNCM of this.  CSW with then sign off.    Employment status:  Retired Nurse, adult PT Recommendations:  [place] / Referral to community resources:  Other (Comment Required) (RNCM  for home health services, referred to [place])  Patient/Family's Response to care:  States he is very pleased with current care provided in hospital but is anxious to return home.    Patient/Family's Understanding of and Emotional Response to Diagnosis, Current Treatment, and Prognosis: Pt is aware of physical limitations, however he states he would rather live poorly at home than be in a nursing home where he felt he received "bad care".  Pt states that simply wants help at home and feels he could then manage.  Patient exhibits a good understanding of his health but continues to deny recommendation of 24 hour care.  States that the best facility he was in (has been in 3) was [facility]. He liked the therapy there but declined offer to try to arrange rehab back there.    Emotional Assessment Appearance:  Appears stated age Attitude/Demeanor/Rapport:   (Rational, cooperative, Responsive, quiet) Affect (typically observed):  Calm, Pleasant, Quiet (Appropriate) Orientation:  Oriented to Self, Oriented to Place, Oriented to  Time, Oriented to Situation Alcohol / Substance use:  Tobacco Use (Former smoker 40+ yrs ago) Psych involvement (Current and /or in the community):  No (Comment)  Discharge Needs  Concerns to be addressed:  Other (Comment Required (Needs in-home care, HH) Readmission within the last 30 days:  No Current discharge risk:  Dependent with Mobility,  Lives alone, Lack of support system (Cousin helps as much as possible- works and has family.) Barriers to Discharge:  Continued Medical Work up   Estill Bakes 08/07/2016, 12:32 PM

## 2016-08-07 NOTE — Progress Notes (Signed)
OT Cancellation Note  Patient Details Name: Timothy Gross MRN: [id number] DOB: [birth year]   Cancelled Treatment:    Reason Eval/Treat Not Completed: Other (comment) Pt stating he didn't sleep well last night and wanted to rest currently. He also stated his stomach was hurting earlier this am and now feeling better but declined to move around right now. Will check back later time.  Judithann SaugerStone, Tarez Bowns [place] 08/07/2016, 10:57 AM

## 2016-08-07 NOTE — Progress Notes (Signed)
PROGRESS NOTE    Timothy Gross  ZOX:[id number] DOB: December 19, 1940 DOA: 08/04/2016 PCP: No PCP Per Patient  Outpatient Specialists:     Brief Narrative:  54 ?  status post cholecystectomy 09/2011 History post polio syndrome with gait instability Normal EGD 07/30/11 9 hypertension Type 2 diabetes mellitus on insulin Reflux Prior cervical myelopathy  2-3 days of nausea vomiting and dehydration came to the emergency room found to be in A. fib RVR with [place] score 4 On admission potassium 3.2 glucose 188 LFTs normal, magnesium 1.2 at admission Hemoglobin normal no white count Heart rate 140s and started on Cardizem drip echo ordered, GI pathogen panel pending and as pressure was slightly low and hypotensive HCT is no tense and was discontinued Cardiology consulted and ECHO pending Converted to NSR 08/06/16     Assessment & Plan:   Active Problems:   Hypertension   Iron deficiency anemia   DM (diabetes mellitus), type 2, uncontrolled (HCC)   Hyperlipidemia   GERD (gastroesophageal reflux disease)   Atrial fibrillation with RVR (HCC)   1 Atrial fibrillation with RVR-chad score ~ 3- Cardizem drip off 3/2, metoprolol 12.5 bid started this admission. Monitor blood pressure on step down as slightly hypotensive. Echo grade 1 DD. Keep magnesium above 2 [now 1.6], K about 4-troponin trend is flat -->non isch.converted 3/2.  Reasess on tele but probably self limiting 2/2 to illness 2 N/v/diarr-unclear etiology-no exposure to anyone-only intermittent n/v.  Unlikely preipitant as this is semi-acute in nature.--is on metformin which can cause diarrhea.  Hold PTA reglan 3 Diabetes mellitus type 2 uncontrolled-discontinue linagliptin 5 and metformin 100 bid daily. ?   Levemir 30-->35 hs-cont SSI-tredn 151-210 4 htn-holding HCTZ for now-re-implement if prn/Benazepril 40  5 Reflux continue pantoprazole 40 daily-carafate started this admit but not really changing things moderate malnutrition albumin  2.6 6    DVT prophylaxis: Lovenox Code Status: Full Family Communication: none beside Disposition Plan: home likely 1-2 days dependant on PT input--Might need SNF--re-eval am and see if able to do for self and could d/c home if all stable.  Keep on telemetry until sure no arrythmia   Consultants:   cardiology  Procedures:  Echo 3/3 Study Conclusions  - Left ventricle: Wall thickness was increased in a pattern of   moderate LVH. Systolic function was normal. The estimated   ejection fraction was in the range of 50% to 55%. Doppler   parameters are consistent with abnormal left ventricular   relaxation (grade 1 diastolic dysfunction). - Aortic valve: Mildly calcified annulus. - Mitral valve: Moderately calcified annulus. Moderately thickened,   mildly calcified leaflets . The findings are consistent with mild   stenosis. Valve area by continuity equation (using LVOT flow):   1.04 cm^2. - Left atrium: The atrium was mildly dilated. - Pericardium, extracardiac: A trivial pericardial effusion was    identified.  Antimicrobials:   None    Subjective:  Eating fair Sat up side of bed with therapy 1 diarrhea yesterday Some discofmrt  And still having some pain in belly but is imporved  Objective: Vitals:   08/06/16 1400 08/06/16 1625 08/06/16 2129 08/07/16 0445  BP: (!) 151/65 (!) 149/108 (!) 165/86 (!) 147/84  Pulse: 77 86 85 81  Resp: 12 15 16 16   Temp:  98.3 F (36.8 C) 98 F (36.7 C) 97.5 F (36.4 C)  TempSrc:  Oral Oral Oral  SpO2: 97% 100% 100% 99%  Weight:  101.3 kg (223 lb 5.2 oz)  Height:  5\' 11"  (1.803 m)      Intake/Output Summary (Last 24 hours) at 08/07/16 0855 Last data filed at 08/07/16 0842  Gross per 24 hour  Intake             2046 ml  Output             1900 ml  Net              146 ml   Filed Weights   08/04/16 1440 08/06/16 1625  Weight: 95.6 kg (210 lb 12.2 oz) 101.3 kg (223 lb 5.2 oz)    Examination:  General exam: Appears  calm and comfortable  Respiratory system: Clear to auscultation. Respiratory effort normal. Cardiovascular system: S1 & S2 heard,afib alt sinus on tele No pedal edema.tele is NSR Gastrointestinal system: Abdomen is distended  nontender.Normal bowel sounds heard. Central nervous system: Alert and oriented. No focal neurological deficits. Extremities: Symmetric 5 x 5 power. Skin: No rashes, lesions or ulcers Psychiatry: Judgement and insight appear normal. Mood & affect appropriate.     Data Reviewed: I have personally reviewed following labs and imaging studies  CBC:  Recent Labs Lab 08/04/16 0827 08/04/16 1416 08/07/16 0530  WBC 5.7 7.1 4.2  NEUTROABS 3.2  --   --   HGB 14.9 13.9 11.8*  HCT 44.1 41.3 35.2*  MCV 83.5 83.6 82.6  PLT 332 293 283   Basic Metabolic Panel:  Recent Labs Lab 08/04/16 0827 08/04/16 1131 08/04/16 1416 08/05/16 0051 08/06/16 0410 08/07/16 0530  NA 134*  --   --  132* 133* 130*  K 3.2*  --   --  4.7 4.8 4.4  CL 92*  --   --  101 104 100*  CO2 32  --   --  26 25 25   GLUCOSE 188*  --   --  221* 230* 210*  BUN 10  --   --  8 9 10   CREATININE 0.37*  --  0.47* 0.41* 0.56* 0.40*  CALCIUM 9.5  --   --  8.1* 8.5* 8.3*  MG  --  1.2*  --  2.1 1.8 1.6*   GFR: Estimated Creatinine Clearance: 95.2 mL/min (by C-G formula based on SCr of 0.4 mg/dL (L)). Liver Function Tests:  Recent Labs Lab 08/04/16 0827 08/05/16 0051 08/06/16 0410  AST 21 16 17   ALT 16* 11* 14*  ALKPHOS 55 41 45  BILITOT 0.5 0.7 0.8  PROT 8.0 5.7* 6.4*  ALBUMIN 3.8 2.6* 3.1*   No results for input(s): LIPASE, AMYLASE in the last 168 hours. No results for input(s): AMMONIA in the last 168 hours. Coagulation Profile: No results for input(s): INR, PROTIME in the last 168 hours. Cardiac Enzymes:  Recent Labs Lab 08/04/16 1416 08/04/16 1926 08/05/16 0051  TROPONINI <0.03 <0.03 <0.03   BNP (last 3 results) No results for input(s): PROBNP in the last 8760  hours. HbA1C: No results for input(s): HGBA1C in the last 72 hours. CBG:  Recent Labs Lab 08/06/16 0726 08/06/16 1153 08/06/16 1724 08/06/16 2127 08/07/16 0743  GLUCAP 206* 225* 166* 279* 151*   Lipid Profile: No results for input(s): CHOL, HDL, LDLCALC, TRIG, CHOLHDL, LDLDIRECT in the last 72 hours. Thyroid Function Tests:  Recent Labs  08/04/16 1131 08/04/16 1926  TSH 0.998  --   FREET4  --  1.20*   Anemia Panel: No results for input(s): VITAMINB12, FOLATE, FERRITIN, TIBC, IRON, RETICCTPCT in the last 72 hours. Urine analysis:    Component  Value Date/Time   COLORURINE YELLOW 08/04/2016 0830   APPEARANCEUR CLEAR 08/04/2016 0830   LABSPEC 1.038 (H) 08/04/2016 0830   PHURINE 6.0 08/04/2016 0830   GLUCOSEU 150 (A) 08/04/2016 0830   HGBUR NEGATIVE 08/04/2016 0830   BILIRUBINUR NEGATIVE 08/04/2016 0830   KETONESUR 20 (A) 08/04/2016 0830   PROTEINUR NEGATIVE 08/04/2016 0830   UROBILINOGEN 1.0 05/23/2014 1004   NITRITE NEGATIVE 08/04/2016 0830   LEUKOCYTESUR NEGATIVE 08/04/2016 0830   Sepsis Labs: @LABRCNTIP (procalcitonin:4,lacticidven:4)  ) Recent Results (from the past 240 hour(s))  MRSA PCR Screening     Status: None   Collection Time: 08/04/16  1:58 PM  Result Value Ref Range Status   MRSA by PCR NEGATIVE NEGATIVE Final    Comment:        The GeneXpert MRSA Assay (FDA approved for NASAL specimens only), is one component of a comprehensive MRSA colonization surveillance program. It is not intended to diagnose MRSA infection nor to guide or monitor treatment for MRSA infections.   Gastrointestinal Panel by PCR , Stool     Status: None   Collection Time: 08/05/16 11:38 AM  Result Value Ref Range Status   Campylobacter species NOT DETECTED NOT DETECTED Final   Plesimonas shigelloides NOT DETECTED NOT DETECTED Final   Salmonella species NOT DETECTED NOT DETECTED Final   Yersinia enterocolitica NOT DETECTED NOT DETECTED Final   Vibrio species NOT DETECTED  NOT DETECTED Final   Vibrio cholerae NOT DETECTED NOT DETECTED Final   Enteroaggregative E coli (EAEC) NOT DETECTED NOT DETECTED Final   Enteropathogenic E coli (EPEC) NOT DETECTED NOT DETECTED Final   Enterotoxigenic E coli (ETEC) NOT DETECTED NOT DETECTED Final   Shiga like toxin producing E coli (STEC) NOT DETECTED NOT DETECTED Final   Shigella/Enteroinvasive E coli (EIEC) NOT DETECTED NOT DETECTED Final   Cryptosporidium NOT DETECTED NOT DETECTED Final   Cyclospora cayetanensis NOT DETECTED NOT DETECTED Final   Entamoeba histolytica NOT DETECTED NOT DETECTED Final   Giardia lamblia NOT DETECTED NOT DETECTED Final   Adenovirus F40/41 NOT DETECTED NOT DETECTED Final   Astrovirus NOT DETECTED NOT DETECTED Final   Norovirus GI/GII NOT DETECTED NOT DETECTED Final   Rotavirus A NOT DETECTED NOT DETECTED Final   Sapovirus (I, II, IV, and V) NOT DETECTED NOT DETECTED Final         Radiology Studies: No results found.      Scheduled Meds: . enoxaparin (LOVENOX) injection  40 mg Subcutaneous Q24H  . insulin aspart  0-9 Units Subcutaneous TID WC  . insulin detemir  35 Units Subcutaneous QHS  . metoprolol  12.5 mg Oral BID  . pantoprazole  40 mg Oral Daily  . sodium chloride  500 mL Intravenous Once  . sucralfate  1 g Oral TID WC & HS   Continuous Infusions: . 0.9 % sodium chloride with kcl 50 mL/hr at 08/07/16 0704     LOS: 3 days    Time spent: 59    Pleas Koch, MD [facility] ([facility]   If 7PM-7AM, please contact night-coverage [email or web address] Password [email or web address] 08/07/2016, 8:55 AM

## 2016-08-07 NOTE — Progress Notes (Signed)
Progress Note  Patient Name: VINAL ROSENGRANT Date of Encounter: 08/07/2016  Primary Cardiologist: Eldridge Dace  Subjective   76 yo , hx of DM, polio, hyperlipidemia Admitted with belly pain and elevated glucose levels  Was found to have New atrial fib.  Appears to have converted overnight ( ECG ordered)  Feels better   Inpatient Medications    Scheduled Meds: . enoxaparin (LOVENOX) injection  40 mg Subcutaneous Q24H  . insulin aspart  0-9 Units Subcutaneous TID WC  . insulin detemir  35 Units Subcutaneous QHS  . metoprolol  12.5 mg Oral BID  . pantoprazole  40 mg Oral Daily  . sodium chloride  500 mL Intravenous Once  . sucralfate  1 g Oral TID WC & HS   Continuous Infusions: . 0.9 % sodium chloride with kcl 50 mL/hr at 08/07/16 0704   PRN Meds: acetaminophen **OR** acetaminophen, levalbuterol, ondansetron **OR** ondansetron (ZOFRAN) IV, polyethylene glycol, polyvinyl alcohol   Vital Signs    Vitals:   08/06/16 1400 08/06/16 1625 08/06/16 2129 08/07/16 0445  BP: (!) 151/65 (!) 149/108 (!) 165/86 (!) 147/84  Pulse: 77 86 85 81  Resp: 12 15 16 16   Temp:  98.3 F (36.8 C) 98 F (36.7 C) 97.5 F (36.4 C)  TempSrc:  Oral Oral Oral  SpO2: 97% 100% 100% 99%  Weight:  223 lb 5.2 oz (101.3 kg)    Height:  5\' 11"  (1.803 m)      Intake/Output Summary (Last 24 hours) at 08/07/16 0810 Last data filed at 08/07/16 0655  Gross per 24 hour  Intake             1686 ml  Output             1600 ml  Net               86 ml   Filed Weights   08/04/16 1440 08/06/16 1625  Weight: 210 lb 12.2 oz (95.6 kg) 223 lb 5.2 oz (101.3 kg)    Telemetry    ? NSR - lots of artifact  - Personally Reviewed  ECG    Atrial fib with RVR from yesterday  - Personally Reviewed  Physical Exam   GEN: No acute distress.   Neck: No JVD Cardiac: RRR, no murmurs, rubs, or gallops.  Respiratory: Clear to auscultation bilaterally. GI: Soft, nontender, non-distended  MS: No edema; No  deformity. Neuro:  Nonfocal  Psych: Normal affect   Labs    Chemistry Recent Labs Lab 08/04/16 0827  08/05/16 0051 08/06/16 0410 08/07/16 0530  NA 134*  --  132* 133* 130*  K 3.2*  --  4.7 4.8 4.4  CL 92*  --  101 104 100*  CO2 32  --  26 25 25   GLUCOSE 188*  --  221* 230* 210*  BUN 10  --  8 9 10   CREATININE 0.37*  < > 0.41* 0.56* 0.40*  CALCIUM 9.5  --  8.1* 8.5* 8.3*  PROT 8.0  --  5.7* 6.4*  --   ALBUMIN 3.8  --  2.6* 3.1*  --   AST 21  --  16 17  --   ALT 16*  --  11* 14*  --   ALKPHOS 55  --  41 45  --   BILITOT 0.5  --  0.7 0.8  --   GFRNONAA >60  < > >60 >60 >60  GFRAA >60  < > >60 >60 >60  ANIONGAP  10  --  5 4* 5  < > = values in this interval not displayed.   Hematology  Recent Labs Lab 08/04/16 0827 08/04/16 1416 08/07/16 0530  WBC 5.7 7.1 4.2  RBC 5.28 4.94 4.26  HGB 14.9 13.9 11.8*  HCT 44.1 41.3 35.2*  MCV 83.5 83.6 82.6  MCH 28.2 28.1 27.7  MCHC 33.8 33.7 33.5  RDW 13.1 13.1 13.2  PLT 332 293 283    Cardiac Enzymes  Recent Labs Lab 08/04/16 1416 08/04/16 1926 08/05/16 0051  TROPONINI <0.03 <0.03 <0.03   No results for input(s): TROPIPOC in the last 168 hours.   BNPNo results for input(s): BNP, PROBNP in the last 168 hours.   DDimer No results for input(s): DDIMER in the last 168 hours.   Radiology    No results found.  Cardiac Studies      Patient Profile     76 y.o. male with HTN, hyperlipidemia, polio, poorly controlled DM Admitted with hyperglycemia and abd pain Was noted to be in atrial fib     Assessment & Plan    1. Paroxysmal atrial fib:  CHADS2VASC of 4( age 76, HTN, DM)  Currently on Lovenox. He has converted to NSR This episode of AF is likely related to his underlying illness Consider OAC if he has further episodes  He will follow up with Dr. Eldridge DaceVaranasi Will sign off. Call for questions    Signed, Kristeen MissPhilip Doraine Schexnider, MD  08/07/2016, 8:10 AM

## 2016-08-08 LAB — GLUCOSE, CAPILLARY
GLUCOSE-CAPILLARY: 151 mg/dL — AB (ref 65–99)
Glucose-Capillary: 107 mg/dL — ABNORMAL HIGH (ref 65–99)

## 2016-08-08 MED ORDER — SUCRALFATE 1 G PO TABS: 1.0000 g | ORAL_TABLET | Freq: Three times a day (TID) | ORAL | 0 refills | Status: DC

## 2016-08-08 MED ORDER — METOPROLOL TARTRATE 25 MG PO TABS: 12.5000 mg | ORAL_TABLET | Freq: Every day | ORAL | 1 refills | Status: AC

## 2016-08-08 MED ORDER — PANTOPRAZOLE SODIUM 40 MG PO TBEC: 40.0000 mg | DELAYED_RELEASE_TABLET | Freq: Two times a day (BID) | ORAL | 0 refills | Status: AC
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
## 2016-08-08 NOTE — Evaluation (Signed)
Occupational Therapy Evaluation Patient Details Name: Timothy Gross MRN: [id number] DOB: 22-Oct-1940 Today's Date: 08/08/2016    History of Present Illness 76y.o. African American male with history of hypertension, GERD, hyperlipidemia, type 2 diabetes, history of polio with polio syndrome presents with generalized abdominal pain and elevated blood sugars. EKG shows new onset AFib with RVR, heart rate in the 140s. Labs otherwise show significant dehydration with hypokalemia   Clinical Impression   Pt admitted with abdominal pain. Pt currently with functional limitations due to the deficits listed below (see OT Problem List). Pt will benefit from skilled OT to increase their safety and independence with ADL and functional mobility for ADL to facilitate discharge to venue listed below.      Follow Up Recommendations  Home health OT;Supervision/Assistance - 24 hour          Precautions / Restrictions Precautions Precautions: Fall Precaution Comments: R>L leg paresis Restrictions Weight Bearing Restrictions: Yes RLE Weight Bearing: Non weight bearing LLE Weight Bearing: Non weight bearing      Mobility Bed Mobility Overal bed mobility: Needs Assistance Bed Mobility: Supine to Sit;Sit to Supine     Supine to sit: Min guard Sit to supine: Min assist   General bed mobility comments: the patient uses hands to manually move the legs over to bed edge. and able to right his trunk to sitting.  Assist with buttocks and use of bed pad to scoot to bed edge.  Transfers                 General transfer comment: set the patient up for a sliding board tansfer but did not complete for saefety reasons. the [facility] brake was interfering and the Sliding board was angled, also patient  did not have on any pants.     Balance Overall balance assessment: Needs assistance Sitting-balance support: Feet supported;No upper extremity supported Sitting balance-Leahy Scale: Fair Sitting balance -  Comments: able  to weight shift to each side and scoot to bed edge. sat x 30 minutes.                                     ADL Overall ADL's : Needs assistance/impaired Eating/Feeding: Sitting;Set up   Grooming: Sitting;Set up   Upper Body Bathing: Sitting;Set up   Lower Body Bathing: Sitting/lateral leans;Cueing for compensatory techniques;Moderate assistance;Cueing for safety   Upper Body Dressing : Set up;Sitting   Lower Body Dressing: Moderate assistance;Sitting/lateral leans;Cueing for compensatory techniques     Toilet Transfer Details (indicate cue type and reason): Pt uses a drop arm 3 n 1 at home.  OT/PT simulated slide board transfer but did not complete due to safety concerns as set up is not like pts home.  pt feels he will be OK at home. Reccomend HHOT and pt use urinal and bed pan as needed     Pt will benefit from AE for increased I with ADL activity.               Vision Patient Visual Report: No change from baseline              Pertinent Vitals/Pain Pain Assessment: No/denies pain        Extremity/Trunk Assessment Upper Extremity Assessment Upper Extremity Assessment: Generalized weakness RUE Deficits / Details: pt with post polio and arms are functional for him but a little weaker than normal- will benefit from post acute  OT           Communication Communication Communication: No difficulties   Cognition Arousal/Alertness: Awake/alert Behavior During Therapy: WFL for tasks assessed/performed Overall Cognitive Status: Within Functional Limits for tasks assessed                                Home Living Family/patient expects to be discharged to:: Private residence Living Arrangements: Alone Available Help at Discharge: Family;Available PRN/intermittently Type of Home: House Home Access: Ramped entrance     Home Layout: One level     Bathroom Shower/Tub: Chief Strategy Officer: Standard      Home Equipment: Environmental consultant - 2 wheels;Bedside commode;Wheelchair - power;Wheelchair - manual   Additional Comments: cousin availble intermittenetly.       Prior Functioning/Environment Level of Independence: Needs assistance  Gait / Transfers Assistance Needed: uses Hover round, sliding board transfers to [facility] and chair. ADL's / Homemaking Assistance Needed: dresses and sponge bathes and toilets self            OT Problem List: Decreased strength;Decreased activity tolerance;Decreased knowledge of use of DME or AE      OT Treatment/Interventions: Self-care/ADL training;DME and/or AE instruction;Patient/family education    OT Goals(Current goals can be found in the care plan section) Acute Rehab OT Goals Patient Stated Goal: to be able to take care of myself OT Goal Formulation: With patient Time For Goal Achievement: 08/22/16 Potential to Achieve Goals: Good  OT Frequency: Min 2X/week   Barriers to D/C: Decreased caregiver support          Co-evaluation PT/OT/SLP Co-Evaluation/Treatment: Yes Reason for Co-Treatment: For patient/therapist safety;To address functional/ADL transfers PT goals addressed during session: Mobility/safety with mobility OT goals addressed during session: ADL's and self-care      End of Session Nurse Communication: Mobility status  Activity Tolerance: Patient tolerated treatment well Patient left: in bed  OT Visit Diagnosis: Muscle weakness (generalized) (M62.81)                ADL either performed or assessed with clinical judgement  Time: 1610-9604 OT Time Calculation (min): 53 min Charges:  OT General Charges $OT Visit: 1 Procedure OT Evaluation $OT Eval Moderate Complexity: 1 Procedure OT Treatments $Self Care/Home Management : 8-22 mins G-Codes:     Lise Auer, OT [phone]  Einar Crow D 08/08/2016, 1:18 PM

## 2016-08-08 NOTE — Progress Notes (Signed)
Patient discharged home via ambulance, discharge instructions given and explained to patient and where to pick-up his prescription and he verbalized understanding, denies any pain/distress. Care-giver/family at the bedside during transportation. Skin intact, no wound noted.

## 2016-08-08 NOTE — Progress Notes (Signed)
Physical Therapy Treatment Patient Details Name: Timothy Gross MRN: [id number] DOB: 08/18/1940 Today's Date: 08/08/2016    History of Present Illness 76 y.o. African American male with history of hypertension, GERD, hyperlipidemia, type 2 diabetes, history of polio with polio syndrome presents with generalized abdominal pain and elevated blood sugars. EKG shows new onset AFib with RVR, heart rate in the 140s. Labs otherwise show significant dehydration with hypokalemia    PT Comments    The patient is planning DC to home with HHPT.   Follow Up Recommendations  SNF;Home health PT     Equipment Recommendations    none   Recommendations for Other Services       Precautions / Restrictions Precautions Precautions: Fall Precaution Comments: R>L leg paresis Restrictions Weight Bearing Restrictions: Yes RLE Weight Bearing: Non weight bearing LLE Weight Bearing: Non weight bearing    Mobility  Bed Mobility Overal bed mobility: Needs Assistance Bed Mobility: Supine to Sit;Sit to Supine     Supine to sit:  Sit to supine: Min assist   General bed mobility comments: the patient uses hands to manually move the right  Leg onto the bed. Min assist with the left leg onto the bed. Repositions self on the bed and pulls self up in the bed with rails.                  Ambulation/Gait                 Stairs            Wheelchair Mobility    Modified Rankin (Stroke Patients Only)       Balance                                    Cognition Arousal/Alertness: Awake/alert Behavior During Therapy: WFL for tasks assessed/performed Overall Cognitive Status: Within Functional Limits for tasks assessed                      Exercises      General Comments        Pertinent Vitals/Pain Pain Assessment: No/denies pain    Home Living Family/patient expects to be discharged to:: Private residence Living Arrangements: Alone Available  Help at Discharge: Family;Available PRN/intermittently Type of Home: House Home Access: Ramped entrance   Home Layout: One level Home Equipment: Walker - 2 wheels;Bedside commode;Wheelchair - power;Wheelchair - manual Additional Comments: cousin availble intermittenetly.     Prior Function Level of Independence: Needs assistance  Gait / Transfers Assistance Needed: uses Hover round, sliding board transfers to [facility] and chair. ADL's / Homemaking Assistance Needed: dresses and sponge bathes and toilets self     PT Goals (current goals can now be found in the care plan section) Progress towards PT goals: Progressing toward goals    Frequency    Min 3X/week      PT Plan Current plan remains appropriate    Co-evaluation PT/OT/SLP Co-Evaluation/Treatment: Yes Reason for Co-Treatment: For patient/therapist safety;To address functional/ADL transfers PT goals addressed during session: Mobility/safety with mobility OT goals addressed during session: ADL's and self-care     End of Session   Activity Tolerance: Patient tolerated treatment well Patient left: in bed (sitting on bedside with CM in room.) Nurse Communication: Mobility status PT Visit Diagnosis: Muscle weakness (generalized) (M62.81);Other symptoms and signs involving the nervous system (W09.811)     Time: 9147-8295  PT Time Calculation (min) (ACUTE ONLY): 14 min  Charges:  $Therapeutic Activity: 8-22 mins                    G Codes:       Rada HayHill, Hasina Kreager Elizabeth 08/08/2016, 1:15 PM Blanchard KelchKaren Deylan Canterbury PT [phone]

## 2016-08-08 NOTE — Progress Notes (Signed)
Physical Therapy Treatment Patient Details Name: Charissa Bashddie L Durkee MRN: [id number] DOB: 1941/03/19 Today's Date: 08/08/2016    History of Present Illness 76 y.o. African American male with history of hypertension, GERD, hyperlipidemia, type 2 diabetes, history of polio with polio syndrome presents with generalized abdominal pain and elevated blood sugars. EKG shows new onset AFib with RVR, heart rate in the 140s. Labs otherwise show significant dehydration with hypokalemia    PT Comments    The patient is mobilizing self with less assistance. Unfortunately, [facility] was  Not deemed safe for transfer. Patient has a scooter and sliding board PTA. The patient is able to process and instruct on how he transfers PTA.  Follow Up Recommendations  ;Home health PT-declines snf     Equipment Recommendations    none   Recommendations for Other Services       Precautions / Restrictions Precautions Precautions: Fall Precaution Comments: R>L leg paresis Restrictions Weight Bearing Restrictions: Yes RLE Weight Bearing: Non weight bearing LLE Weight Bearing: Non weight bearing    Mobility  Bed Mobility Overal bed mobility: Needs Assistance Bed Mobility: Supine to Sit;Sit to Supine     Supine to sit: Min guard     General bed mobility comments: the patient uses hands to manually move the legs over to bed edge. and able to right his trunk to sitting.  Assist with buttocks and use of bed pad to scoot to bed edge.  Transfers                 General transfer comment: set the patient up for a sliding board tansfer but did not complete for saefety reasons. [facility] and the Sliding board was angled, also patient  did not have on any pants.   Ambulation/Gait                 Stairs            Wheelchair Mobility    Modified Rankin (Stroke Patients Only)       Balance                                    Cognition  Arousal/Alertness: Awake/alert Behavior During Therapy: WFL for tasks assessed/performed Overall Cognitive Status: Within Functional Limits for tasks assessed                      Exercises      General Comments        Pertinent Vitals/Pain Pain Assessment: No/denies pain    Home Living Family/patient expects to be discharged to:: Private residence Living Arrangements: Alone Available Help at Discharge: Family;Available PRN/intermittently Type of Home: House Home Access: Ramped entrance   Home Layout: One level Home Equipment: Walker - 2 wheels;Bedside commode;Wheelchair - power;Wheelchair - manual Additional Comments: cousin availble intermittenetly.     Prior Function Level of Independence: Needs assistance  Gait / Transfers Assistance Needed: uses Hover round, sliding board [facility] to [facility] and chair. ADL's / Homemaking Assistance Needed: dresses and sponge bathes and toilets self     PT Goals (current goals can now be found in the care plan section) Progress towards PT goals: Progressing toward goals    Frequency    Min 3X/week      PT Plan Current plan remains appropriate    Co-evaluation PT/OT/SLP Co-Evaluation/Treatment: Yes Reason for Co-Treatment: Complexity of  the patient's impairments (multi-system involvement);For patient/therapist safety PT goals addressed during session: Mobility/safety with mobility       End of Session   Activity Tolerance: Patient tolerated treatment well Patient left: in bed (sitting on bedside with CM in room.) Nurse Communication: Mobility status PT Visit Diagnosis: Muscle weakness (generalized) (M62.81);Other symptoms and signs involving the nervous system (R29.898)     Time: 1610-9604 PT Time Calculation (min) (ACUTE ONLY): 42 min  Charges:  $Therapeutic Activity: 23-37 mins                    G Codes:       Sharen Heck PT [phone]  08/08/2016, 1:11 PM

## 2016-08-08 NOTE — Progress Notes (Signed)
Occupational Therapy Treatment Patient Details Name: OM LIZOTTE MRN: [id number] DOB: 1940-11-14 Today's Date: 08/08/2016    History of present illness 76 y.o. African American male with history of hypertension, GERD, hyperlipidemia, type 2 diabetes, history of polio with polio syndrome presents with generalized abdominal pain and elevated blood sugars. EKG shows new onset AFib with RVR, heart rate in the 140s. Labs otherwise show significant dehydration with hypokalemia   OT comments  Pt very appreciative of AE and feels this will make a big difference for him  Follow Up Recommendations  Home health OT;Supervision/Assistance - 24 hour          Precautions / Restrictions Precautions Precautions: Fall Precaution Comments: R>L leg paresis Restrictions Weight Bearing Restrictions: Yes RLE Weight Bearing: Non weight bearing LLE Weight Bearing: Non weight bearing       Mobility Bed Mobility Overal bed mobility: Needs Assistance Bed Mobility: Supine to Sit;Sit to Supine     Supine to sit: Min guard Sit to supine: Min assist   General bed mobility comments: the patient uses hands to manually move the legs over to bed edge. and able to right his trunk to sitting.  Assist with buttocks and use of bed pad to scoot to bed edge.  Transfers                 General transfer comment: set the patient up for a sliding board tansfer but did not complete for saefety reasons. the [facility] brake was interfering and the Sliding board was angled, also patient  did not have on any pants.     Balance Overall balance assessment: Needs assistance Sitting-balance support: Feet supported;No upper extremity supported Sitting balance-Leahy Scale: Fair Sitting balance - Comments: able  to weight shift to each side and scoot to bed edge. sat x 30 minutes.                            ADL Overall ADL's : Needs assistance/impaired Eating/Feeding: Sitting;Set up   Grooming: Sitting;Set  up   Upper Body Bathing: Sitting;Set up   Lower Body Bathing: Minimal assistance;Sit to/from stand;Sitting/lateral leans;With adaptive equipment   Upper Body Dressing : Set up;Sitting   Lower Body Dressing: Moderate assistance;Sitting/lateral leans;Cueing for compensatory techniques     Toilet Transfer Details (indicate cue type and reason): Pt uses a drop arm 3 n 1 at home.  OT/PT simulated slide board transfer but did not complete due to safety concerns as set up is not like pts home.  pt feels he will be OK at home. Reccomend HHOT and pt use urinal and bed pan as needed           General ADL Comments: OT session focused on AE education- reacher, shoe horn, leg lifter and long handled sponge.  Pt did not feel he would be able to purchase these items.  OT did provide as pt states he is medicaid pending..  Education complete regarding AE and how to use with ADL activity .  Pt feels this will help him maintain his I at home      Vision                     Perception     Praxis      Cognition   Behavior During Therapy: [facility] for tasks assessed/performed Overall Cognitive Status: Within Functional Limits for tasks assessed  Exercises     Shoulder Instructions       General Comments      Pertinent Vitals/ Pain       Pain Assessment: No/denies pain  Home Living Family/patient expects to be discharged to:: Private residence Living Arrangements: Alone Available Help at Discharge: Family;Available PRN/intermittently Type of Home: House Home Access: Ramped entrance     Home Layout: One level     Bathroom Shower/Tub: Chief Strategy OfficerTub/shower unit   Bathroom Toilet: Standard     Home Equipment: Environmental consultantWalker - 2 wheels;Bedside commode;Wheelchair - power;Wheelchair - manual   Additional Comments: cousin availble intermittenetly.       Prior Functioning/Environment Level of Independence: Needs assistance  Gait / Transfers Assistance Needed: uses  Hover round, sliding [facility] and chair. ADL's / Homemaking Assistance Needed: dresses and sponge bathes and toilets self       Frequency  Min 2X/week        Progress Toward Goals  OT Goals(current goals can now be found in the care plan section)  Progress towards OT goals: Progressing toward goals  Acute Rehab OT Goals Patient Stated Goal: to be able to take care of myself OT Goal Formulation: With patient Time For Goal Achievement: 08/22/16 Potential to Achieve Goals: Good ADL Goals Additional ADL Goal #1: Pt will demonstrate correct use of AE to increase I with ADL activity  Plan Discharge plan remains appropriate    Co-evaluation    PT/OT/SLP Co-Evaluation/Treatment: Yes Reason for Co-Treatment: For patient/therapist safety;To address functional/ADL transfers PT goals addressed during session: Mobility/safety with mobility OT goals addressed during session: ADL's and self-care      End of Session    OT Visit Diagnosis: Muscle weakness (generalized) (M62.81)   Activity Tolerance Patient tolerated treatment well   Patient Left in bed   Nurse Communication Mobility status        Time: [id number] OT Time Calculation (min): 53 min  Charges: OT General Charges $OT Visit: 1 Procedure OT Evaluation $OT Eval Moderate Complexity: 1 Procedure OT Treatments $Self Care/Home Management : 8-22 mins  [place] Margarita[place]ori Daianna Vasques, [place]OT [phone]   Einar CrowEDDING, Donnella Morford D 08/08/2016, 1:35 PM

## 2016-08-08 NOTE — Progress Notes (Signed)
Pt declined SNF, going home with HHRN/pt/ot/NA with [facility]. Referral given to in house rep.

## 2016-08-13 DIAGNOSIS — E785 Hyperlipidemia, unspecified: Secondary | ICD-10-CM | POA: Diagnosis not present

## 2016-08-13 DIAGNOSIS — Z8719 Personal history of other diseases of the digestive system: Secondary | ICD-10-CM | POA: Diagnosis not present

## 2016-08-13 DIAGNOSIS — G959 Disease of spinal cord, unspecified: Secondary | ICD-10-CM | POA: Diagnosis not present

## 2016-08-13 DIAGNOSIS — E1165 Type 2 diabetes mellitus with hyperglycemia: Secondary | ICD-10-CM | POA: Diagnosis not present

## 2016-08-13 DIAGNOSIS — D509 Iron deficiency anemia, unspecified: Secondary | ICD-10-CM | POA: Diagnosis not present

## 2016-08-13 DIAGNOSIS — R531 Weakness: Secondary | ICD-10-CM | POA: Diagnosis not present

## 2016-08-13 DIAGNOSIS — I10 Essential (primary) hypertension: Secondary | ICD-10-CM | POA: Diagnosis not present

## 2016-08-13 DIAGNOSIS — G14 Postpolio syndrome: Secondary | ICD-10-CM | POA: Diagnosis not present

## 2016-08-13 DIAGNOSIS — Z8612 Personal history of poliomyelitis: Secondary | ICD-10-CM | POA: Diagnosis not present

## 2016-08-13 DIAGNOSIS — I480 Paroxysmal atrial fibrillation: Secondary | ICD-10-CM | POA: Diagnosis not present

## 2016-08-17 DIAGNOSIS — Z8719 Personal history of other diseases of the digestive system: Secondary | ICD-10-CM | POA: Diagnosis not present

## 2016-08-17 DIAGNOSIS — G14 Postpolio syndrome: Secondary | ICD-10-CM | POA: Diagnosis not present

## 2016-08-17 DIAGNOSIS — G959 Disease of spinal cord, unspecified: Secondary | ICD-10-CM | POA: Diagnosis not present

## 2016-08-17 DIAGNOSIS — E785 Hyperlipidemia, unspecified: Secondary | ICD-10-CM | POA: Diagnosis not present

## 2016-08-17 DIAGNOSIS — I480 Paroxysmal atrial fibrillation: Secondary | ICD-10-CM | POA: Diagnosis not present

## 2016-08-17 DIAGNOSIS — I10 Essential (primary) hypertension: Secondary | ICD-10-CM | POA: Diagnosis not present

## 2016-08-17 DIAGNOSIS — D509 Iron deficiency anemia, unspecified: Secondary | ICD-10-CM | POA: Diagnosis not present

## 2016-08-17 DIAGNOSIS — R531 Weakness: Secondary | ICD-10-CM | POA: Diagnosis not present

## 2016-08-17 DIAGNOSIS — Z8612 Personal history of poliomyelitis: Secondary | ICD-10-CM | POA: Diagnosis not present

## 2016-08-17 DIAGNOSIS — E1165 Type 2 diabetes mellitus with hyperglycemia: Secondary | ICD-10-CM | POA: Diagnosis not present

## 2016-08-19 DIAGNOSIS — I480 Paroxysmal atrial fibrillation: Secondary | ICD-10-CM | POA: Diagnosis not present

## 2016-08-19 DIAGNOSIS — G959 Disease of spinal cord, unspecified: Secondary | ICD-10-CM | POA: Diagnosis not present

## 2016-08-19 DIAGNOSIS — Z8719 Personal history of other diseases of the digestive system: Secondary | ICD-10-CM | POA: Diagnosis not present

## 2016-08-19 DIAGNOSIS — G14 Postpolio syndrome: Secondary | ICD-10-CM | POA: Diagnosis not present

## 2016-08-19 DIAGNOSIS — E785 Hyperlipidemia, unspecified: Secondary | ICD-10-CM | POA: Diagnosis not present

## 2016-08-19 DIAGNOSIS — E1165 Type 2 diabetes mellitus with hyperglycemia: Secondary | ICD-10-CM | POA: Diagnosis not present

## 2016-08-19 DIAGNOSIS — D509 Iron deficiency anemia, unspecified: Secondary | ICD-10-CM | POA: Diagnosis not present

## 2016-08-19 DIAGNOSIS — R531 Weakness: Secondary | ICD-10-CM | POA: Diagnosis not present

## 2016-08-19 DIAGNOSIS — I10 Essential (primary) hypertension: Secondary | ICD-10-CM | POA: Diagnosis not present

## 2016-08-19 DIAGNOSIS — Z8612 Personal history of poliomyelitis: Secondary | ICD-10-CM | POA: Diagnosis not present

## 2016-08-23 DIAGNOSIS — E785 Hyperlipidemia, unspecified: Secondary | ICD-10-CM | POA: Diagnosis not present

## 2016-08-23 DIAGNOSIS — G14 Postpolio syndrome: Secondary | ICD-10-CM | POA: Diagnosis not present

## 2016-08-23 DIAGNOSIS — G959 Disease of spinal cord, unspecified: Secondary | ICD-10-CM | POA: Diagnosis not present

## 2016-08-23 DIAGNOSIS — Z8612 Personal history of poliomyelitis: Secondary | ICD-10-CM | POA: Diagnosis not present

## 2016-08-23 DIAGNOSIS — R531 Weakness: Secondary | ICD-10-CM | POA: Diagnosis not present

## 2016-08-23 DIAGNOSIS — D509 Iron deficiency anemia, unspecified: Secondary | ICD-10-CM | POA: Diagnosis not present

## 2016-08-23 DIAGNOSIS — Z8719 Personal history of other diseases of the digestive system: Secondary | ICD-10-CM | POA: Diagnosis not present

## 2016-08-23 DIAGNOSIS — I480 Paroxysmal atrial fibrillation: Secondary | ICD-10-CM | POA: Diagnosis not present

## 2016-08-23 DIAGNOSIS — E1165 Type 2 diabetes mellitus with hyperglycemia: Secondary | ICD-10-CM | POA: Diagnosis not present

## 2016-08-23 DIAGNOSIS — I10 Essential (primary) hypertension: Secondary | ICD-10-CM | POA: Diagnosis not present

## 2016-08-25 DIAGNOSIS — Z8612 Personal history of poliomyelitis: Secondary | ICD-10-CM | POA: Diagnosis not present

## 2016-08-25 DIAGNOSIS — E785 Hyperlipidemia, unspecified: Secondary | ICD-10-CM | POA: Diagnosis not present

## 2016-08-25 DIAGNOSIS — E1165 Type 2 diabetes mellitus with hyperglycemia: Secondary | ICD-10-CM | POA: Diagnosis not present

## 2016-08-25 DIAGNOSIS — D509 Iron deficiency anemia, unspecified: Secondary | ICD-10-CM | POA: Diagnosis not present

## 2016-08-25 DIAGNOSIS — R69 Illness, unspecified: Secondary | ICD-10-CM | POA: Diagnosis not present

## 2016-08-25 DIAGNOSIS — I10 Essential (primary) hypertension: Secondary | ICD-10-CM | POA: Diagnosis not present

## 2016-08-25 DIAGNOSIS — R531 Weakness: Secondary | ICD-10-CM | POA: Diagnosis not present

## 2016-08-25 DIAGNOSIS — Z8719 Personal history of other diseases of the digestive system: Secondary | ICD-10-CM | POA: Diagnosis not present

## 2016-08-25 DIAGNOSIS — G959 Disease of spinal cord, unspecified: Secondary | ICD-10-CM | POA: Diagnosis not present

## 2016-08-25 DIAGNOSIS — I480 Paroxysmal atrial fibrillation: Secondary | ICD-10-CM | POA: Diagnosis not present

## 2016-08-25 DIAGNOSIS — G14 Postpolio syndrome: Secondary | ICD-10-CM | POA: Diagnosis not present

## 2016-08-30 DIAGNOSIS — Z8612 Personal history of poliomyelitis: Secondary | ICD-10-CM | POA: Diagnosis not present

## 2016-08-30 DIAGNOSIS — E785 Hyperlipidemia, unspecified: Secondary | ICD-10-CM | POA: Diagnosis not present

## 2016-08-30 DIAGNOSIS — R531 Weakness: Secondary | ICD-10-CM | POA: Diagnosis not present

## 2016-08-30 DIAGNOSIS — D509 Iron deficiency anemia, unspecified: Secondary | ICD-10-CM | POA: Diagnosis not present

## 2016-08-30 DIAGNOSIS — Z8719 Personal history of other diseases of the digestive system: Secondary | ICD-10-CM | POA: Diagnosis not present

## 2016-08-30 DIAGNOSIS — G14 Postpolio syndrome: Secondary | ICD-10-CM | POA: Diagnosis not present

## 2016-08-30 DIAGNOSIS — G959 Disease of spinal cord, unspecified: Secondary | ICD-10-CM | POA: Diagnosis not present

## 2016-08-30 DIAGNOSIS — I10 Essential (primary) hypertension: Secondary | ICD-10-CM | POA: Diagnosis not present

## 2016-08-30 DIAGNOSIS — I480 Paroxysmal atrial fibrillation: Secondary | ICD-10-CM | POA: Diagnosis not present

## 2016-08-30 DIAGNOSIS — E1165 Type 2 diabetes mellitus with hyperglycemia: Secondary | ICD-10-CM | POA: Diagnosis not present

## 2016-09-07 DIAGNOSIS — E785 Hyperlipidemia, unspecified: Secondary | ICD-10-CM | POA: Diagnosis not present

## 2016-09-07 DIAGNOSIS — R531 Weakness: Secondary | ICD-10-CM | POA: Diagnosis not present

## 2016-09-07 DIAGNOSIS — E1165 Type 2 diabetes mellitus with hyperglycemia: Secondary | ICD-10-CM | POA: Diagnosis not present

## 2016-09-07 DIAGNOSIS — G959 Disease of spinal cord, unspecified: Secondary | ICD-10-CM | POA: Diagnosis not present

## 2016-09-07 DIAGNOSIS — I480 Paroxysmal atrial fibrillation: Secondary | ICD-10-CM | POA: Diagnosis not present

## 2016-09-07 DIAGNOSIS — Z8612 Personal history of poliomyelitis: Secondary | ICD-10-CM | POA: Diagnosis not present

## 2016-09-07 DIAGNOSIS — I10 Essential (primary) hypertension: Secondary | ICD-10-CM | POA: Diagnosis not present

## 2016-09-07 DIAGNOSIS — D509 Iron deficiency anemia, unspecified: Secondary | ICD-10-CM | POA: Diagnosis not present

## 2016-09-07 DIAGNOSIS — Z8719 Personal history of other diseases of the digestive system: Secondary | ICD-10-CM | POA: Diagnosis not present

## 2016-09-07 DIAGNOSIS — G14 Postpolio syndrome: Secondary | ICD-10-CM | POA: Diagnosis not present

## 2016-09-27 DIAGNOSIS — E1165 Type 2 diabetes mellitus with hyperglycemia: Secondary | ICD-10-CM | POA: Diagnosis not present

## 2016-09-27 DIAGNOSIS — I480 Paroxysmal atrial fibrillation: Secondary | ICD-10-CM | POA: Diagnosis not present

## 2016-09-27 DIAGNOSIS — G14 Postpolio syndrome: Secondary | ICD-10-CM | POA: Diagnosis not present

## 2016-09-27 DIAGNOSIS — G959 Disease of spinal cord, unspecified: Secondary | ICD-10-CM | POA: Diagnosis not present

## 2016-09-27 DIAGNOSIS — Z8612 Personal history of poliomyelitis: Secondary | ICD-10-CM | POA: Diagnosis not present

## 2016-09-27 DIAGNOSIS — Z8719 Personal history of other diseases of the digestive system: Secondary | ICD-10-CM | POA: Diagnosis not present

## 2016-09-27 DIAGNOSIS — I10 Essential (primary) hypertension: Secondary | ICD-10-CM | POA: Diagnosis not present

## 2016-09-27 DIAGNOSIS — E785 Hyperlipidemia, unspecified: Secondary | ICD-10-CM | POA: Diagnosis not present

## 2016-09-27 DIAGNOSIS — D509 Iron deficiency anemia, unspecified: Secondary | ICD-10-CM | POA: Diagnosis not present

## 2016-09-27 DIAGNOSIS — R531 Weakness: Secondary | ICD-10-CM | POA: Diagnosis not present

## 2016-10-06 DIAGNOSIS — E1165 Type 2 diabetes mellitus with hyperglycemia: Secondary | ICD-10-CM | POA: Diagnosis not present

## 2016-10-06 DIAGNOSIS — I10 Essential (primary) hypertension: Secondary | ICD-10-CM | POA: Diagnosis not present

## 2016-10-06 DIAGNOSIS — G14 Postpolio syndrome: Secondary | ICD-10-CM | POA: Diagnosis not present

## 2016-10-06 DIAGNOSIS — E782 Mixed hyperlipidemia: Secondary | ICD-10-CM | POA: Diagnosis not present

## 2016-12-10 DIAGNOSIS — Z0000 Encounter for general adult medical examination without abnormal findings: Secondary | ICD-10-CM | POA: Diagnosis not present

## 2016-12-10 DIAGNOSIS — R600 Localized edema: Secondary | ICD-10-CM | POA: Diagnosis not present

## 2016-12-10 DIAGNOSIS — B91 Sequelae of poliomyelitis: Secondary | ICD-10-CM | POA: Diagnosis not present

## 2016-12-10 DIAGNOSIS — K219 Gastro-esophageal reflux disease without esophagitis: Secondary | ICD-10-CM | POA: Diagnosis not present

## 2016-12-10 DIAGNOSIS — I10 Essential (primary) hypertension: Secondary | ICD-10-CM | POA: Diagnosis not present

## 2016-12-10 DIAGNOSIS — E119 Type 2 diabetes mellitus without complications: Secondary | ICD-10-CM | POA: Diagnosis not present

## 2016-12-10 DIAGNOSIS — Z794 Long term (current) use of insulin: Secondary | ICD-10-CM | POA: Diagnosis not present

## 2016-12-10 DIAGNOSIS — E785 Hyperlipidemia, unspecified: Secondary | ICD-10-CM | POA: Diagnosis not present

## 2016-12-10 DIAGNOSIS — E1129 Type 2 diabetes mellitus with other diabetic kidney complication: Secondary | ICD-10-CM | POA: Diagnosis not present

## 2017-01-19 ENCOUNTER — Emergency Department (HOSPITAL_COMMUNITY): Payer: Medicare HMO

## 2017-01-19 ENCOUNTER — Emergency Department (HOSPITAL_COMMUNITY)
Admission: EM | Admit: 2017-01-19 | Discharge: 2017-01-20 | Disposition: A | Discharge status: Home / Self Care | Payer: Medicare HMO | Attending: Emergency Medicine | Admitting: Emergency Medicine

## 2017-01-19 DIAGNOSIS — I10 Essential (primary) hypertension: Secondary | ICD-10-CM | POA: Insufficient documentation

## 2017-01-19 DIAGNOSIS — Z79899 Other long term (current) drug therapy: Secondary | ICD-10-CM | POA: Diagnosis not present

## 2017-01-19 DIAGNOSIS — R1084 Generalized abdominal pain: Secondary | ICD-10-CM | POA: Diagnosis not present

## 2017-01-19 DIAGNOSIS — E119 Type 2 diabetes mellitus without complications: Secondary | ICD-10-CM | POA: Insufficient documentation

## 2017-01-19 DIAGNOSIS — Z7984 Long term (current) use of oral hypoglycemic drugs: Secondary | ICD-10-CM | POA: Insufficient documentation

## 2017-01-19 DIAGNOSIS — K5730 Diverticulosis of large intestine without perforation or abscess without bleeding: Secondary | ICD-10-CM | POA: Diagnosis not present

## 2017-01-19 DIAGNOSIS — K5900 Constipation, unspecified: Secondary | ICD-10-CM | POA: Insufficient documentation

## 2017-01-19 DIAGNOSIS — Z87891 Personal history of nicotine dependence: Secondary | ICD-10-CM | POA: Insufficient documentation

## 2017-01-19 DIAGNOSIS — G14 Postpolio syndrome: Secondary | ICD-10-CM | POA: Diagnosis not present

## 2017-01-19 DIAGNOSIS — R109 Unspecified abdominal pain: Secondary | ICD-10-CM | POA: Diagnosis not present

## 2017-01-19 LAB — URINALYSIS, ROUTINE W REFLEX MICROSCOPIC
BILIRUBIN URINE: NEGATIVE
GLUCOSE, UA: NEGATIVE mg/dL
Hgb urine dipstick: NEGATIVE
KETONES UR: 20 mg/dL — AB
Leukocytes, UA: NEGATIVE
NITRITE: NEGATIVE
PH: 7 (ref 5.0–8.0)
Protein, ur: NEGATIVE mg/dL
Specific Gravity, Urine: 1.016 (ref 1.005–1.030)

## 2017-01-19 LAB — HEPATIC FUNCTION PANEL
ALT: 13 U/L — ABNORMAL LOW (ref 17–63)
AST: 17 U/L (ref 15–41)
Albumin: 3.5 g/dL (ref 3.5–5.0)
Alkaline Phosphatase: 45 U/L (ref 38–126)
BILIRUBIN DIRECT: 0.1 mg/dL (ref 0.1–0.5)
BILIRUBIN TOTAL: 0.6 mg/dL (ref 0.3–1.2)
Indirect Bilirubin: 0.5 mg/dL (ref 0.3–0.9)
Total Protein: 6.7 g/dL (ref 6.5–8.1)

## 2017-01-19 LAB — CBC WITH DIFFERENTIAL/PLATELET
Basophils Absolute: 0 10*3/uL (ref 0.0–0.1)
Basophils Relative: 0 %
EOS ABS: 0.2 10*3/uL (ref 0.0–0.7)
EOS PCT: 3 %
HCT: 40.2 % (ref 39.0–52.0)
HEMOGLOBIN: 14 g/dL (ref 13.0–17.0)
LYMPHS ABS: 1.6 10*3/uL (ref 0.7–4.0)
Lymphocytes Relative: 28 %
MCH: 28.9 pg (ref 26.0–34.0)
MCHC: 34.8 g/dL (ref 30.0–36.0)
MCV: 83.1 fL (ref 78.0–100.0)
MONO ABS: 0.5 10*3/uL (ref 0.1–1.0)
MONOS PCT: 8 %
Neutro Abs: 3.7 10*3/uL (ref 1.7–7.7)
Neutrophils Relative %: 61 %
PLATELETS: 287 10*3/uL (ref 150–400)
RBC: 4.84 MIL/uL (ref 4.22–5.81)
RDW: 13.1 % (ref 11.5–15.5)
WBC: 5.9 10*3/uL (ref 4.0–10.5)

## 2017-01-19 LAB — BASIC METABOLIC PANEL
Anion gap: 9 (ref 5–15)
BUN: 8 mg/dL (ref 6–20)
CHLORIDE: 93 mmol/L — AB (ref 101–111)
CO2: 27 mmol/L (ref 22–32)
CREATININE: 0.45 mg/dL — AB (ref 0.61–1.24)
Calcium: 8.9 mg/dL (ref 8.9–10.3)
GFR calc Af Amer: >60 mL/min (ref >60)
GFR calc non Af Amer: >60 mL/min (ref >60)
Glucose, Bld: 241 mg/dL — ABNORMAL HIGH (ref 65–99)
Potassium: 4 mmol/L (ref 3.5–5.1)
SODIUM: 129 mmol/L — AB (ref 135–145)

## 2017-01-19 LAB — LIPASE, BLOOD: Lipase: 17 U/L (ref 11–51)

## 2017-01-19 LAB — I-STAT CG4 LACTIC ACID, ED: LACTIC ACID, VENOUS: 1.51 mmol/L (ref 0.5–1.9)

## 2017-01-19 MED ORDER — IOPAMIDOL (ISOVUE-300) INJECTION 61%
INTRAVENOUS | Status: AC
  Filled 2017-01-19: qty 100

## 2017-01-19 MED ORDER — SODIUM CHLORIDE 0.9 % IV BOLUS (SEPSIS)
500.0000 mL | Freq: Once | INTRAVENOUS | Status: AC
  Administered 2017-01-19: 500 mL via INTRAVENOUS

## 2017-01-19 MED ORDER — IOPAMIDOL (ISOVUE-300) INJECTION 61%
100.0000 mL | Freq: Once | INTRAVENOUS | Status: AC | PRN
  Administered 2017-01-19: 100 mL via INTRAVENOUS
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
## 2017-01-19 NOTE — ED Triage Notes (Signed)
Per EMS pt coming from home complaining of LUQ pain that began 2 days ago. Pt also having tenderness in RLQ. No nausea, vomiting, or diarrhea. Pt reports no bowel movement in 3 days.   132/72 HR 76 Resp 16 CBG 244 - pt reports CBG is in normal range for him

## 2017-01-19 NOTE — ED Notes (Signed)
Bed: [id number] Expected date:  Expected time:  Means of arrival:  Comments: 76 yr old rt quad pain

## 2017-01-20 DIAGNOSIS — R1084 Generalized abdominal pain: Secondary | ICD-10-CM | POA: Diagnosis not present

## 2017-01-20 DIAGNOSIS — R110 Nausea: Secondary | ICD-10-CM | POA: Diagnosis not present

## 2017-01-20 MED ORDER — POLYETHYLENE GLYCOL 3350 17 G PO PACK: 17.0000 g | PACK | Freq: Every day | ORAL | 0 refills | Status: AC

## 2017-01-20 NOTE — ED Notes (Signed)
[facility] contacted for discharge transport 

## 2017-01-20 NOTE — Discharge Instructions (Signed)
Return to the ED with any concerns including vomiting and not able to keep down liquids or your medications, abdominal pain especially if it localizes to the right lower abdomen, fever or chills, and decreased urine output, decreased level of alertness or lethargy, or any other alarming symptoms.  °

## 2017-02-21 DIAGNOSIS — R69 Illness, unspecified: Secondary | ICD-10-CM | POA: Diagnosis not present

## 2017-04-05 DIAGNOSIS — R69 Illness, unspecified: Secondary | ICD-10-CM | POA: Diagnosis not present

## 2017-04-21 DIAGNOSIS — I10 Essential (primary) hypertension: Secondary | ICD-10-CM | POA: Diagnosis not present

## 2017-04-21 DIAGNOSIS — Z794 Long term (current) use of insulin: Secondary | ICD-10-CM | POA: Diagnosis not present

## 2017-04-21 DIAGNOSIS — E1165 Type 2 diabetes mellitus with hyperglycemia: Secondary | ICD-10-CM | POA: Diagnosis not present

## 2017-05-23 DIAGNOSIS — R69 Illness, unspecified: Secondary | ICD-10-CM | POA: Diagnosis not present

## 2017-06-09 DIAGNOSIS — E782 Mixed hyperlipidemia: Secondary | ICD-10-CM | POA: Diagnosis not present

## 2017-06-09 DIAGNOSIS — E1165 Type 2 diabetes mellitus with hyperglycemia: Secondary | ICD-10-CM | POA: Diagnosis not present

## 2017-06-09 DIAGNOSIS — L299 Pruritus, unspecified: Secondary | ICD-10-CM | POA: Diagnosis not present

## 2017-06-09 DIAGNOSIS — G14 Postpolio syndrome: Secondary | ICD-10-CM | POA: Diagnosis not present

## 2017-06-09 DIAGNOSIS — Z0000 Encounter for general adult medical examination without abnormal findings: Secondary | ICD-10-CM | POA: Diagnosis not present

## 2017-06-09 DIAGNOSIS — I10 Essential (primary) hypertension: Secondary | ICD-10-CM | POA: Diagnosis not present

## 2017-07-20 DIAGNOSIS — R69 Illness, unspecified: Secondary | ICD-10-CM | POA: Diagnosis not present

## 2017-08-27 DIAGNOSIS — R69 Illness, unspecified: Secondary | ICD-10-CM | POA: Diagnosis not present

## 2017-10-12 DIAGNOSIS — R69 Illness, unspecified: Secondary | ICD-10-CM | POA: Diagnosis not present

## 2017-11-27 DIAGNOSIS — R69 Illness, unspecified: Secondary | ICD-10-CM | POA: Diagnosis not present

## 2017-11-28 DIAGNOSIS — Z7984 Long term (current) use of oral hypoglycemic drugs: Secondary | ICD-10-CM | POA: Diagnosis not present

## 2017-11-28 DIAGNOSIS — B91 Sequelae of poliomyelitis: Secondary | ICD-10-CM | POA: Diagnosis not present

## 2017-11-28 DIAGNOSIS — K08409 Partial loss of teeth, unspecified cause, unspecified class: Secondary | ICD-10-CM | POA: Diagnosis not present

## 2017-11-28 DIAGNOSIS — E669 Obesity, unspecified: Secondary | ICD-10-CM | POA: Diagnosis not present

## 2017-11-28 DIAGNOSIS — M8960 Osteopathy after poliomyelitis, unspecified site: Secondary | ICD-10-CM | POA: Diagnosis not present

## 2017-11-28 DIAGNOSIS — K219 Gastro-esophageal reflux disease without esophagitis: Secondary | ICD-10-CM | POA: Diagnosis not present

## 2017-11-28 DIAGNOSIS — Z794 Long term (current) use of insulin: Secondary | ICD-10-CM | POA: Diagnosis not present

## 2017-11-28 DIAGNOSIS — E1165 Type 2 diabetes mellitus with hyperglycemia: Secondary | ICD-10-CM | POA: Diagnosis not present

## 2017-11-28 DIAGNOSIS — E785 Hyperlipidemia, unspecified: Secondary | ICD-10-CM | POA: Diagnosis not present

## 2017-11-28 DIAGNOSIS — I10 Essential (primary) hypertension: Secondary | ICD-10-CM | POA: Diagnosis not present

## 2017-12-07 ENCOUNTER — Emergency Department (HOSPITAL_COMMUNITY)
Admission: EM | Admit: 2017-12-07 | Discharge: 2017-12-08 | Disposition: A | Discharge status: Home / Self Care | Payer: Medicare HMO | Attending: Emergency Medicine | Admitting: Emergency Medicine

## 2017-12-07 ENCOUNTER — Encounter (HOSPITAL_COMMUNITY): Payer: Self-pay

## 2017-12-07 ENCOUNTER — Emergency Department (HOSPITAL_COMMUNITY): Payer: Medicare HMO

## 2017-12-07 ENCOUNTER — Other Ambulatory Visit: Payer: Self-pay

## 2017-12-07 DIAGNOSIS — E119 Type 2 diabetes mellitus without complications: Secondary | ICD-10-CM | POA: Insufficient documentation

## 2017-12-07 DIAGNOSIS — Z794 Long term (current) use of insulin: Secondary | ICD-10-CM | POA: Diagnosis not present

## 2017-12-07 DIAGNOSIS — Z87891 Personal history of nicotine dependence: Secondary | ICD-10-CM | POA: Diagnosis not present

## 2017-12-07 DIAGNOSIS — R197 Diarrhea, unspecified: Secondary | ICD-10-CM

## 2017-12-07 DIAGNOSIS — R1031 Right lower quadrant pain: Secondary | ICD-10-CM | POA: Insufficient documentation

## 2017-12-07 DIAGNOSIS — R300 Dysuria: Secondary | ICD-10-CM | POA: Diagnosis not present

## 2017-12-07 DIAGNOSIS — Z79899 Other long term (current) drug therapy: Secondary | ICD-10-CM | POA: Insufficient documentation

## 2017-12-07 DIAGNOSIS — I10 Essential (primary) hypertension: Secondary | ICD-10-CM | POA: Diagnosis not present

## 2017-12-07 DIAGNOSIS — N401 Enlarged prostate with lower urinary tract symptoms: Secondary | ICD-10-CM | POA: Diagnosis not present

## 2017-12-07 DIAGNOSIS — K7460 Unspecified cirrhosis of liver: Secondary | ICD-10-CM | POA: Diagnosis not present

## 2017-12-07 LAB — COMPREHENSIVE METABOLIC PANEL
ALBUMIN: 3.6 g/dL (ref 3.5–5.0)
ALK PHOS: 46 U/L (ref 38–126)
ALT: 17 U/L (ref 0–44)
ANION GAP: 12 (ref 5–15)
AST: 30 U/L (ref 15–41)
BUN: 9 mg/dL (ref 8–23)
CALCIUM: 9 mg/dL (ref 8.9–10.3)
CO2: 25 mmol/L (ref 22–32)
Chloride: 91 mmol/L — ABNORMAL LOW (ref 98–111)
Creatinine, Ser: 0.58 mg/dL — ABNORMAL LOW (ref 0.61–1.24)
GFR calc non Af Amer: >60 mL/min (ref >60)
GLUCOSE: 300 mg/dL — AB (ref 70–99)
Potassium: 4.2 mmol/L (ref 3.5–5.1)
SODIUM: 128 mmol/L — AB (ref 135–145)
TOTAL PROTEIN: 6.9 g/dL (ref 6.5–8.1)
Total Bilirubin: 1.1 mg/dL (ref 0.3–1.2)

## 2017-12-07 LAB — CBC WITH DIFFERENTIAL/PLATELET
BASOS ABS: 0 10*3/uL (ref 0.0–0.1)
BASOS PCT: 0 %
EOS ABS: 0.3 10*3/uL (ref 0.0–0.7)
EOS PCT: 6 %
HCT: 40.6 % (ref 39.0–52.0)
Hemoglobin: 14.1 g/dL (ref 13.0–17.0)
LYMPHS PCT: 26 %
Lymphs Abs: 1.5 10*3/uL (ref 0.7–4.0)
MCH: 28.5 pg (ref 26.0–34.0)
MCHC: 34.7 g/dL (ref 30.0–36.0)
MCV: 82 fL (ref 78.0–100.0)
MONO ABS: 0.5 10*3/uL (ref 0.1–1.0)
Monocytes Relative: 8 %
Neutro Abs: 3.4 10*3/uL (ref 1.7–7.7)
Neutrophils Relative %: 60 %
PLATELETS: 302 10*3/uL (ref 150–400)
RBC: 4.95 MIL/uL (ref 4.22–5.81)
RDW: 12.8 % (ref 11.5–15.5)
WBC: 5.7 10*3/uL (ref 4.0–10.5)

## 2017-12-07 LAB — URINALYSIS, ROUTINE W REFLEX MICROSCOPIC
BACTERIA UA: NONE SEEN
BILIRUBIN URINE: NEGATIVE
Glucose, UA: >=500 mg/dL — AB
HGB URINE DIPSTICK: NEGATIVE
KETONES UR: 20 mg/dL — AB
Leukocytes, UA: NEGATIVE
NITRITE: NEGATIVE
PH: 5 (ref 5.0–8.0)
Protein, ur: NEGATIVE mg/dL
SPECIFIC GRAVITY, URINE: 1.013 (ref 1.005–1.030)

## 2017-12-07 LAB — CBG MONITORING, ED: GLUCOSE-CAPILLARY: 278 mg/dL — AB (ref 70–99)

## 2017-12-07 MED ORDER — IOPAMIDOL (ISOVUE-300) INJECTION 61%
INTRAVENOUS | Status: AC
  Filled 2017-12-07: qty 100

## 2017-12-07 MED ORDER — SODIUM CHLORIDE 0.9 % IV BOLUS
1000.0000 mL | Freq: Once | INTRAVENOUS | Status: AC
  Administered 2017-12-07: 1000 mL via INTRAVENOUS

## 2017-12-07 MED ORDER — IOPAMIDOL (ISOVUE-300) INJECTION 61%
100.0000 mL | Freq: Once | INTRAVENOUS | Status: AC | PRN
  Administered 2017-12-08: 100 mL via INTRAVENOUS

## 2017-12-07 MED ORDER — SODIUM CHLORIDE 0.9 % IV SOLN
Freq: Once | INTRAVENOUS | Status: AC
  Administered 2017-12-07: via INTRAVENOUS

## 2017-12-07 NOTE — ED Provider Notes (Signed)
DM, HTN, polio, wheelchair bound Stays in bed all day Has home aid Diarrhea x 4 days, AP, general malaise CT pending for further evaluation.  ??boarder til am secondary to home situation.  Patient's CT is negative for acute findings. Re-evaluation finds him resting well without complaint.   Discussed discharge home and he reports he is uncomfortable going home this later at night and would like to go in the morning. This was arranged with charge nurse. Patient will require [facility] travel home.      [name], Timothy Lovelady, PA-C 12/08/17 [id number]    Tegeler, Canary Brimhristopher J, MD 12/08/17 1213

## 2017-12-07 NOTE — ED Notes (Signed)
Bed: [id number] Expected date:  Expected time:  Means of arrival:  Comments: 11M dysuria

## 2017-12-07 NOTE — ED Triage Notes (Signed)
Per ems: pt coming from home c/o painful and dark urine as well as diarrhea for the last 4 days. Non-ambulatory. Hx of polio

## 2017-12-07 NOTE — ED Notes (Addendum)
Stool sample sent down for C-diff. Lab tech rejected because sample was too formed.

## 2017-12-07 NOTE — ED Provider Notes (Signed)
[facility]-EMERGENCY DEPT Provider Note   CSN: [id number] Arrival date & time: 12/07/17  1850     History   Chief Complaint Chief Complaint  Patient presents with  . Dysuria    HPI Timothy Gross is a 77 y.o. male.  HPI Timothy Gross is a 77 y.o. male with history of diabetes, hypertension, anemia, history of polio, wheelchair-bound, A. fib, presents to emergency department with complaint of diarrhea for 4 days and dark urine with some dysuria that started today.  Patient states that he lives alone, but has a cousin that checks on him and a home health nurse that comes daily.  He states that he normally just stays in the bed, and does transfer to a commode.  He states he has had some diarrhea that he no longer can transfer safely.  He states that anytime he tries to eat something he has to use the bathroom so he reports decreased oral over the last several days.  He states he is having some abdominal cramping that is associated with his diarrhea.  No nausea or vomiting.  No fever or chills.  He reports that he urine has been dark and that today he has developed some dysuria.  Denies any recent antibiotics.  No sick contacts.  No other complaints or  Past Medical History:  Diagnosis Date  . Cervical myelopathy (HCC)    Hx of  . Diabetes mellitus   . GERD (gastroesophageal reflux disease)   . H/O acute alcoholic hepatitis   . History of post-polio syndrome    right sided weakness  . History of UTI   . Hyperlipidemia   . Hypertension   . Iron deficiency anemia   . Right patella fracture   . Right sided weakness     Patient Active Problem List   Diagnosis Date Noted  . Atrial fibrillation with RVR (HCC) 08/04/2016  . Symptomatic cholelithiasis 09/09/2011  . Constipation 09/09/2011  . Hyponatremia 07/28/2011  . Elevated LFTs 07/28/2011  . Abdominal pain 07/28/2011  . Generalized weakness 07/28/2011  . Dehydration 07/28/2011  . Hypertension   .  History of UTI   . Iron deficiency anemia   . DM (diabetes mellitus), type 2, uncontrolled (HCC)   . Hyperlipidemia   . GERD (gastroesophageal reflux disease)   . History of post-polio syndrome     Past Surgical History:  Procedure Laterality Date  . CERVICAL FUSION  04/16/2003  . CHOLECYSTECTOMY  09/11/2011   Procedure: LAPAROSCOPIC CHOLECYSTECTOMY WITH INTRAOPERATIVE CHOLANGIOGRAM;  Surgeon: Atilano Ina, MD,FACS;  Location: [facility] OR;  Service: General;  Laterality: N/A;  . ESOPHAGOGASTRODUODENOSCOPY  07/28/2011   Procedure: ESOPHAGOGASTRODUODENOSCOPY (EGD);  Surgeon: Louis Meckel, MD;  Location: [facility];  Service: Endoscopy;  Laterality: N/A;  . ORIF Right Femur Fracture    . radioactive iodine treatment  1984        Home Medications    Prior to Admission medications   Medication Sig Start Date End Date Taking? Authorizing Provider  Aspirin-Salicylamide-Caffeine (BC FAST PAIN RELIEF) 650-195-33.3 MG PACK Take 1 packet by mouth every 4 (four) hours as needed (for pain).    [provider]  benazepril (LOTENSIN) 40 MG tablet Take 40 mg by mouth daily.     [provider]  dextran 70-hypromellose (TEARS RENEWED) ophthalmic solution Place 1 drop into both eyes 3 (three) times daily as needed for dry eyes. For dry eyes    [provider]  hydrochlorothiazide (HYDRODIURIL) 25 MG  tablet Take 25 mg by mouth daily.    [provider]  JANUVIA 100 MG tablet Take 100 mg by mouth daily. 07/01/16   [provider]  LEVEMIR FLEXTOUCH 100 UNIT/ML Pen Inject 25-30 Units into the skin at bedtime. None if BS is under 200 07/07/16   [provider]  metFORMIN (GLUCOPHAGE) 1000 MG tablet Take 1,000 mg by mouth 2 (two) times daily. 07/09/16   [provider]  metoprolol (LOPRESSOR) 25 MG tablet Take 0.5 tablets (12.5 mg total) by mouth daily. 08/08/16   Rhetta MuraSamtani, Jai-Gurmukh, MD  Multiple Vitamin (MULITIVITAMIN WITH MINERALS) TABS Take 1  tablet by mouth daily.    [provider]  pantoprazole (PROTONIX) 40 MG tablet Take 1 tablet (40 mg total) by mouth 2 (two) times daily. 08/08/16   Rhetta MuraSamtani, Jai-Gurmukh, MD  polyethylene glycol (MIRALAX) packet Take 17 g by mouth daily. 01/20/17   Mabe, Latanya MaudlinMartha L, MD  simvastatin (ZOCOR) 40 MG tablet Take 40 mg by mouth every evening.    [provider]  sucralfate (CARAFATE) 1 g tablet Take 1 tablet (1 g total) by mouth 4 (four) times daily -  with meals and at bedtime. 08/08/16   Rhetta MuraSamtani, Jai-Gurmukh, MD  hyoscyamine (LEVBID) 0.375 MG 12 hr tablet Take 1 tablet (0.375 mg total) by mouth every 12 (twelve) hours. 07/30/11 08/19/11  Cristal Fordeddy, Srikar A, MD    Family History Family History  Problem Relation Age of Onset  . Diabetes Mother   . Diabetes Father   . Diabetes Brother     Social History Social History   Tobacco Use  . Smoking status: Former Smoker    Last attempt to quit: 07/27/1973    Years since quitting: 44.3  . Smokeless tobacco: Never Used  Substance Use Topics  . Alcohol use: No    Comment: Quit around 2001  . Drug use: Not on file     Allergies   Diazepam   Review of Systems Review of Systems  Constitutional: Negative for chills and fever.  Respiratory: Negative for cough, chest tightness and shortness of breath.   Cardiovascular: Negative for chest pain, palpitations and leg swelling.  Gastrointestinal: Positive for abdominal pain and diarrhea. Negative for abdominal distention, nausea and vomiting.  Genitourinary: Positive for dysuria. Negative for frequency, hematuria and urgency.  Musculoskeletal: Negative for arthralgias, myalgias, neck pain and neck stiffness.  Skin: Negative for rash.  Allergic/Immunologic: Negative for immunocompromised state.  Neurological: Negative for dizziness, weakness, light-headedness, numbness and headaches.  All other systems reviewed and are negative.    Physical Exam Updated Vital Signs BP 129/75 (BP  Location: Left Arm)   Pulse 100   Temp 98 F (36.7 C)   Resp 12   SpO2 100%   Physical Exam  Constitutional: He appears well-developed and well-nourished. No distress.  HENT:  Head: Normocephalic and atraumatic.  Eyes: Conjunctivae are normal.  Neck: Neck supple.  Cardiovascular: Normal rate, regular rhythm and normal heart sounds.  Pulmonary/Chest: Effort normal. No respiratory distress. He has no wheezes. He has no rales.  Abdominal: Soft. Bowel sounds are normal. He exhibits no distension. There is tenderness. There is no rebound.  Right lower quadrant tenderness  Musculoskeletal: He exhibits no edema.  Neurological: He is alert.  Skin: Skin is warm and dry.  Nursing note and vitals reviewed.    ED Treatments / Results  Labs (all labs ordered are listed, but only abnormal results are displayed) Labs Reviewed  URINALYSIS, ROUTINE W REFLEX MICROSCOPIC -  Abnormal; Notable for the following components:      Result Value   Glucose, UA >=500 (*)    Ketones, ur 20 (*)    All other components within normal limits  CBG MONITORING, ED - Abnormal; Notable for the following components:   Glucose-Capillary 278 (*)    All other components within normal limits  CBC WITH DIFFERENTIAL/PLATELET  COMPREHENSIVE METABOLIC PANEL    EKG None  Radiology No results found.  Procedures Procedures (including critical care time)  Medications Ordered in ED Medications - No data to display   Initial Impression / Assessment and Plan / ED Course  I have reviewed the triage vital signs and the nursing notes.  Pertinent labs & imaging results that were available during my care of the patient were reviewed by me and considered in my medical decision making (see chart for details).    Patient with diarrhea for 4 days, abdominal cramping, some dysuria.  No blood in his stool or emesis.  We will send C. difficile get labs and hydrate.  9:43 PM Labs show glucose of 300, otherwise with no  significant abnormalities. PT continues to have some tenderness, most in RLQ. Will get CT to ro appendicitis vs colitls. Pt receiving IV fluids.   Patient C. difficile test was canceled by lab because apparently the stool  was partially formed.  I did not see the stool myself, was sent by an Char[employer] fundraiser.  Patient told me it was watery.  11:43 PM  Pt reassessed. Has had 2 diarrhea episodes while in ED, feels slightly better after fluids. Plan is CT, if negative, I think pt is OK to go home.   Vitals:   12/07/17 1909 12/07/17 1940  BP:  129/75  Pulse:  100  Resp:  12  Temp:  98 F (36.7 C)  SpO2: 99% 100%     Final Clinical Impressions(s) / ED Diagnoses   Final diagnoses:  None    ED Discharge Orders    None       Jaynie Crumble, PA-C 12/07/17 2349    Tegeler, Canary Brim, MD 12/08/17 1213

## 2017-12-08 DIAGNOSIS — R197 Diarrhea, unspecified: Secondary | ICD-10-CM | POA: Diagnosis not present

## 2017-12-08 DIAGNOSIS — R1031 Right lower quadrant pain: Secondary | ICD-10-CM | POA: Diagnosis not present

## 2017-12-08 DIAGNOSIS — K7460 Unspecified cirrhosis of liver: Secondary | ICD-10-CM | POA: Diagnosis not present

## 2017-12-08 DIAGNOSIS — E119 Type 2 diabetes mellitus without complications: Secondary | ICD-10-CM | POA: Diagnosis not present

## 2017-12-08 DIAGNOSIS — Z87891 Personal history of nicotine dependence: Secondary | ICD-10-CM | POA: Diagnosis not present

## 2017-12-08 DIAGNOSIS — Z7401 Bed confinement status: Secondary | ICD-10-CM | POA: Diagnosis not present

## 2017-12-08 DIAGNOSIS — M2550 Pain in unspecified joint: Secondary | ICD-10-CM | POA: Diagnosis not present

## 2017-12-08 DIAGNOSIS — Z794 Long term (current) use of insulin: Secondary | ICD-10-CM | POA: Diagnosis not present

## 2017-12-08 DIAGNOSIS — N401 Enlarged prostate with lower urinary tract symptoms: Secondary | ICD-10-CM | POA: Diagnosis not present

## 2017-12-08 DIAGNOSIS — I10 Essential (primary) hypertension: Secondary | ICD-10-CM | POA: Diagnosis not present

## 2017-12-08 DIAGNOSIS — Z79899 Other long term (current) drug therapy: Secondary | ICD-10-CM | POA: Diagnosis not present

## 2017-12-08 MED ORDER — IOPAMIDOL (ISOVUE-300) INJECTION 61%
INTRAVENOUS | Status: AC
  Filled 2017-12-08: qty 100

## 2017-12-08 NOTE — ED Notes (Signed)
[facility] called for transport.  

## 2017-12-08 NOTE — ED Notes (Signed)
Patient transported to CT 

## 2017-12-08 NOTE — ED Notes (Signed)
Per night shift RN, patient is to eat breakfast before [facility] is called for transport. Supposed to call [facility] [phone] when patient is on way.

## 2017-12-08 NOTE — ED Notes (Signed)
Spoke with Timothy Gross who will be at house when patient arrives.

## 2017-12-08 NOTE — Discharge Instructions (Signed)
Use Imodium for diarrhea if having more than 5 bowel movements daily to avoid dehydration. Follow up with your doctor for recheck next week. Return here with any worsening symptoms or new concerns.

## 2017-12-09 ENCOUNTER — Encounter (HOSPITAL_COMMUNITY): Payer: Self-pay

## 2017-12-09 ENCOUNTER — Emergency Department (HOSPITAL_COMMUNITY)
Admission: EM | Admit: 2017-12-09 | Discharge: 2017-12-12 | Disposition: A | Discharge status: Skilled Nursing Facility | Payer: Medicare HMO | Attending: Emergency Medicine | Admitting: Emergency Medicine

## 2017-12-09 ENCOUNTER — Other Ambulatory Visit: Payer: Self-pay

## 2017-12-09 DIAGNOSIS — I10 Essential (primary) hypertension: Secondary | ICD-10-CM | POA: Diagnosis not present

## 2017-12-09 DIAGNOSIS — E119 Type 2 diabetes mellitus without complications: Secondary | ICD-10-CM | POA: Insufficient documentation

## 2017-12-09 DIAGNOSIS — I447 Left bundle-branch block, unspecified: Secondary | ICD-10-CM | POA: Diagnosis not present

## 2017-12-09 DIAGNOSIS — R197 Diarrhea, unspecified: Secondary | ICD-10-CM | POA: Diagnosis not present

## 2017-12-09 DIAGNOSIS — R109 Unspecified abdominal pain: Secondary | ICD-10-CM | POA: Diagnosis present

## 2017-12-09 DIAGNOSIS — Z87891 Personal history of nicotine dependence: Secondary | ICD-10-CM | POA: Diagnosis not present

## 2017-12-09 DIAGNOSIS — R1084 Generalized abdominal pain: Secondary | ICD-10-CM | POA: Diagnosis not present

## 2017-12-09 DIAGNOSIS — R531 Weakness: Secondary | ICD-10-CM | POA: Diagnosis not present

## 2017-12-09 DIAGNOSIS — Z794 Long term (current) use of insulin: Secondary | ICD-10-CM | POA: Insufficient documentation

## 2017-12-09 DIAGNOSIS — Z79899 Other long term (current) drug therapy: Secondary | ICD-10-CM | POA: Diagnosis not present

## 2017-12-09 DIAGNOSIS — R1031 Right lower quadrant pain: Secondary | ICD-10-CM | POA: Insufficient documentation

## 2017-12-09 DIAGNOSIS — A09 Infectious gastroenteritis and colitis, unspecified: Secondary | ICD-10-CM | POA: Diagnosis not present

## 2017-12-09 LAB — I-STAT CHEM 8, ED
BUN: 7 mg/dL — AB (ref 8–23)
CALCIUM ION: 0.95 mmol/L — AB (ref 1.15–1.40)
CHLORIDE: 95 mmol/L — AB (ref 98–111)
CREATININE: 0.3 mg/dL — AB (ref 0.61–1.24)
GLUCOSE: 320 mg/dL — AB (ref 70–99)
HCT: 39 % (ref 39.0–52.0)
Hemoglobin: 13.3 g/dL (ref 13.0–17.0)
POTASSIUM: 5.8 mmol/L — AB (ref 3.5–5.1)
Sodium: 127 mmol/L — ABNORMAL LOW (ref 135–145)
TCO2: 26 mmol/L (ref 22–32)

## 2017-12-09 LAB — I-STAT CG4 LACTIC ACID, ED: Lactic Acid, Venous: 1.62 mmol/L (ref 0.5–1.9)

## 2017-12-09 MED ORDER — SODIUM CHLORIDE 0.9 % IV BOLUS
1000.0000 mL | Freq: Once | INTRAVENOUS | Status: AC
  Administered 2017-12-09: 1000 mL via INTRAVENOUS

## 2017-12-09 MED ORDER — MORPHINE SULFATE (PF) 2 MG/ML IV SOLN
2.0000 mg | Freq: Once | INTRAVENOUS | Status: AC
  Administered 2017-12-09: 2 mg via INTRAVENOUS
  Filled 2017-12-09: qty 1

## 2017-12-09 MED ORDER — ONDANSETRON HCL 4 MG/2ML IJ SOLN
4.0000 mg | Freq: Once | INTRAMUSCULAR | Status: AC
  Administered 2017-12-09: 4 mg via INTRAVENOUS
  Filled 2017-12-09: qty 2
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
## 2017-12-09 NOTE — ED Notes (Signed)
Bed: WA23 Expected date:  Expected time:  Means of arrival:  Comments: ems 

## 2017-12-09 NOTE — ED Triage Notes (Signed)
[facility]. Seen here 2 days ago for same. States he has an appt with his Dr. On Monday.

## 2017-12-10 ENCOUNTER — Emergency Department (HOSPITAL_COMMUNITY): Payer: Medicare HMO

## 2017-12-10 DIAGNOSIS — R109 Unspecified abdominal pain: Secondary | ICD-10-CM | POA: Diagnosis not present

## 2017-12-10 LAB — COMPREHENSIVE METABOLIC PANEL WITH GFR
ALT: 18 U/L (ref 0–44)
AST: 27 U/L (ref 15–41)
Albumin: 3.2 g/dL — ABNORMAL LOW (ref 3.5–5.0)
Alkaline Phosphatase: 45 U/L (ref 38–126)
Anion gap: 12 (ref 5–15)
BUN: 8 mg/dL (ref 8–23)
CO2: 25 mmol/L (ref 22–32)
Calcium: 9 mg/dL (ref 8.9–10.3)
Chloride: 94 mmol/L — ABNORMAL LOW (ref 98–111)
Creatinine, Ser: 0.43 mg/dL — ABNORMAL LOW (ref 0.61–1.24)
GFR calc Af Amer: >60 mL/min
GFR calc non Af Amer: >60 mL/min
Glucose, Bld: 312 mg/dL — ABNORMAL HIGH (ref 70–99)
Potassium: 3.8 mmol/L (ref 3.5–5.1)
Sodium: 131 mmol/L — ABNORMAL LOW (ref 135–145)
Total Bilirubin: 1 mg/dL (ref 0.3–1.2)
Total Protein: 6.2 g/dL — ABNORMAL LOW (ref 6.5–8.1)

## 2017-12-10 LAB — MAGNESIUM: Magnesium: 1.4 mg/dL — ABNORMAL LOW (ref 1.7–2.4)

## 2017-12-10 LAB — CBC WITH DIFFERENTIAL/PLATELET
Basophils Absolute: 0 10*3/uL (ref 0.0–0.1)
Basophils Relative: 0 %
Eosinophils Absolute: 0.5 10*3/uL (ref 0.0–0.7)
Eosinophils Relative: 10 %
HCT: 38.4 % — ABNORMAL LOW (ref 39.0–52.0)
HEMOGLOBIN: 13 g/dL (ref 13.0–17.0)
LYMPHS ABS: 1.2 10*3/uL (ref 0.7–4.0)
LYMPHS PCT: 21 %
MCH: 27.5 pg (ref 26.0–34.0)
MCHC: 33.9 g/dL (ref 30.0–36.0)
MCV: 81.2 fL (ref 78.0–100.0)
Monocytes Absolute: 0.5 10*3/uL (ref 0.1–1.0)
Monocytes Relative: 9 %
NEUTROS PCT: 60 %
Neutro Abs: 3.3 10*3/uL (ref 1.7–7.7)
Platelets: 330 10*3/uL (ref 150–400)
RBC: 4.73 MIL/uL (ref 4.22–5.81)
RDW: 12.9 % (ref 11.5–15.5)
WBC: 5.4 10*3/uL (ref 4.0–10.5)

## 2017-12-10 LAB — CBG MONITORING, ED: Glucose-Capillary: 477 mg/dL — ABNORMAL HIGH (ref 70–99)

## 2017-12-10 LAB — LIPASE, BLOOD: Lipase: 26 U/L (ref 11–51)

## 2017-12-10 MED ORDER — METOPROLOL TARTRATE 25 MG PO TABS
12.5000 mg | ORAL_TABLET | Freq: Every day | ORAL | Status: DC
  Administered 2017-12-10 – 2017-12-12 (×3): 12.5 mg via ORAL
  Filled 2017-12-10 (×3): qty 1

## 2017-12-10 MED ORDER — INSULIN DETEMIR 100 UNIT/ML ~~LOC~~ SOLN
25.0000 [IU] | Freq: Every day | SUBCUTANEOUS | Status: DC
  Administered 2017-12-10 – 2017-12-11 (×2): 25 [IU] via SUBCUTANEOUS
  Filled 2017-12-10 (×3): qty 0.25

## 2017-12-10 MED ORDER — PANTOPRAZOLE SODIUM 40 MG PO TBEC
40.0000 mg | DELAYED_RELEASE_TABLET | Freq: Two times a day (BID) | ORAL | Status: DC
  Administered 2017-12-10 – 2017-12-12 (×4): 40 mg via ORAL
  Filled 2017-12-10 (×4): qty 1

## 2017-12-10 MED ORDER — INSULIN ASPART 100 UNIT/ML ~~LOC~~ SOLN
0.0000 [IU] | Freq: Three times a day (TID) | SUBCUTANEOUS | Status: DC
  Administered 2017-12-11: 8 [IU] via SUBCUTANEOUS
  Administered 2017-12-11: 3 [IU] via SUBCUTANEOUS
  Administered 2017-12-11: 5 [IU] via SUBCUTANEOUS
  Administered 2017-12-12: 2 [IU] via SUBCUTANEOUS
  Filled 2017-12-10 (×5): qty 1

## 2017-12-10 MED ORDER — IOPAMIDOL (ISOVUE-300) INJECTION 61%
100.0000 mL | Freq: Once | INTRAVENOUS | Status: AC | PRN
  Administered 2017-12-10: 100 mL via INTRAVENOUS

## 2017-12-10 MED ORDER — HYDROCHLOROTHIAZIDE 25 MG PO TABS
25.0000 mg | ORAL_TABLET | Freq: Every day | ORAL | Status: DC
  Administered 2017-12-10 – 2017-12-12 (×3): 25 mg via ORAL
  Filled 2017-12-10 (×5): qty 1

## 2017-12-10 MED ORDER — MAGNESIUM OXIDE 400 (241.3 MG) MG PO TABS
800.0000 mg | ORAL_TABLET | Freq: Once | ORAL | Status: AC
  Administered 2017-12-10: 800 mg via ORAL
  Filled 2017-12-10: qty 2

## 2017-12-10 MED ORDER — SIMVASTATIN 40 MG PO TABS
40.0000 mg | ORAL_TABLET | Freq: Every evening | ORAL | Status: DC
  Administered 2017-12-10 – 2017-12-11 (×2): 40 mg via ORAL
  Filled 2017-12-10 (×3): qty 1

## 2017-12-10 MED ORDER — BENAZEPRIL HCL 20 MG PO TABS
40.0000 mg | ORAL_TABLET | Freq: Every day | ORAL | Status: DC
  Administered 2017-12-10 – 2017-12-12 (×3): 40 mg via ORAL
  Filled 2017-12-10 (×5): qty 2

## 2017-12-10 MED ORDER — ACETAMINOPHEN 500 MG PO TABS
1000.0000 mg | ORAL_TABLET | Freq: Four times a day (QID) | ORAL | Status: DC | PRN
  Administered 2017-12-10 – 2017-12-12 (×5): 1000 mg via ORAL
  Filled 2017-12-10 (×5): qty 2

## 2017-12-10 MED ORDER — METFORMIN HCL 500 MG PO TABS
1000.0000 mg | ORAL_TABLET | Freq: Two times a day (BID) | ORAL | Status: DC
  Administered 2017-12-10 – 2017-12-12 (×4): 1000 mg via ORAL
  Filled 2017-12-10 (×4): qty 2

## 2017-12-10 MED ORDER — POLYVINYL ALCOHOL 1.4 % OP SOLN
1.0000 [drp] | Freq: Three times a day (TID) | OPHTHALMIC | Status: DC | PRN
  Filled 2017-12-10: qty 15

## 2017-12-10 NOTE — ED Provider Notes (Signed)
Blood pressure 134/71, pulse (!) 102, temperature 98.6 F (37 C), temperature source Oral, resp. rate 16, height 5\' 11"  (1.803 m), weight 90.7 kg (200 lb), SpO2 97 %.  Assuming care from Dr. Particia NearingHaviland.  In short, Charissa Bashddie L Leach is a 77 y.o. male with a chief complaint of Abdominal Pain .  Refer to the original H&P for additional details.  This Production designer, theatre/television/filmmanager and social work both working with the patient regarding placement.  Social work doubts the patient will be able to be placed from the emergency department.  Case manager discussed with family who are very uncomfortable with the patient returning home in his current state.  I did complete home health orders but case manager recommends PT/OT evaluation in the morning and [place] official word from [employer] but the patient would be denied rehab. Apparently not a good candidate for Medicaid in the near term. Continue to manage in the ED, unfortunately. I re-started home and PRN meds.   Alona BeneJoshua Long, MD     Maia PlanLong, Joshua G, MD 12/10/17 [phone]

## 2017-12-10 NOTE — Care Management Note (Addendum)
Case Management Note  Patient Details  Name: Charissa Bashddie L Scardina MRN: [id number] Date of Birth: [birth year]  Subjective/Objective:  Abdominal pain               Action/Plan: NCM spoke to pt at bedside. States he believes that if he wants SNF rehab that he will be able to gain strength. Pt states he last applied for Medicaid in the past. He has power wheelchair, wheelchair, and bedside commode at home. [facility] come to his home Monday-Friday. He is at home on weekends alone. He gets meals on wheel. He has code lock and they are able to get into home. Waiting PT/OT recommendations for home.   Expected Discharge Date:                 Expected Discharge Plan:  Skilled Nursing Facility  In-House Referral:  Clinical Social Work  Discharge planning Services  CM Consult  Post Acute Care Choice:  Home Health Choice offered to:  Patient  DME Arranged:  N/A DME Agency:  NA  HH Arranged:    HH Agency:     Status of Service:  In process, will continue to follow  If discussed at Long Length of Stay Meetings, dates discussed:    Additional Comments:  Elliot CousinShavis, Deamber Buckhalter Ellen, RN 12/10/2017, 6:25 PM

## 2017-12-10 NOTE — ED Provider Notes (Signed)
Pt signed out from Dr. Adela LankFloyd awaiting placement.  SW has been working with pt all day to get him placed.  SW is still waiting to hear if he is accepted to a facility.   Jacalyn LefevreHaviland, Ronak Duquette, MD 12/10/17 1539

## 2017-12-10 NOTE — Progress Notes (Addendum)
CSW spoke with CSW leadership who state pt's barriers with placement from the ED is the inability to attain Medicaid with pt's current income ansd the inability to fiond a payor at a long-term facility until pt can work with DSS to obtain Medicaid.  CSW leadership recommends referring the pt to [facility] to request that [facility] assist the pt with seeking admission into the  [facility] program.  CSW leadership states that [place] can assist the pt in "giving up" his up [facility] in order to attain or transition to Traditional Medicare (Medicare A&B) for additional insurance support and find out who is assisting him with [place] to seek to increase services in the home.  CSW requested EPD  place a consult for CM with an order for face-to-face and an order for RN/Aide/PT/OT and Social Work for [employer].  Per pt,he has an aide in the home but it is only for 2 hours a day 5 days a week.    CSW spoke to [facility] RN CM who will arrange [facility] services for the pt with [facility] /hh services with RN/Aide/PT/OT and Social Work.    CSW staffed pt's case with the [place] Director who directed pt should be refrred to [facility] for assistance with the following:  CSW contacted the on-call nurse with [facility] who confirmed pt wilkl be contacted by an intake specialist at [facility] on Monday 12/10/16, as well for possible admission to [facility] for Case Mgt services, day programs and medical assistance in the home and possible placement assistance in the future.  CSW will review with the pt the above information and counsel pt on how to arrive to his PCP appointment on Monday 7/8 and request assistance from his PCP for possible placement if the pt's PCP agrees this is possible/appropriate by obtaining an  FL-2.  CSW met with pt and provided pt with the following written instructions:  "[facility] will call you tomorrow to set up home Health Services with an RN, Aide, PT/OT and Social  Work.  [facility] contacted the on-call nurse with [facility] who confirmed you will be contacted by an intake specialist at [facility] on Monday 12/10/16, as well for possible admission to [facility] for Case Mgt services, day programs and medical assistance in the home and possible placement assistance in the future.  Please arrive to your Primary Care Physician's (PCP) appointment on Monday 7/8 and request assistance from your PCP."  CSW called APS after hours line to report pt's home situation at D/C and to request assistance from APS and a call back from the on-call APS person in order to file a report.  CSW met with pt and answered questions. Pt voiced understanding.  CSW received call from CM who stated CM felt pt was unsafe for D/C.  CM met with EDP who is keeping pt ovenight for PT eval.  CSW Director updated  APS called the CSW back to allow the CSW to file a report and CSW stated pt is remaining in the ED overnight and a report will not be made at this time.  CSW updated pt that pt is remaining overnight for a PT eval but cautioned pot strongly that a PT eval stating a need for SNF does not mean that [place] will authorize payment for SNF rehab days and that pt must begin to prepare for this or other  eventualities should pt be returned home at some point due to no payor.  Pt was appreciative, thanked the CSW and shaked the CSW's hand.  2nd shift ED CSW will leave handoff for 1st shift ED CSW.  Please reconsult if future social work needs arise.  CSW signing off, as social work intervention is no longer needed.  Alphonse Guild. Tyeasha Ebbs, LCSW, LCAS, CSI Clinical Social Worker Ph: [phone]

## 2017-12-10 NOTE — Progress Notes (Addendum)
[facility] Contacted pt's cousin to follow up on PCP. [facility].  Isidoro DonningAlesia Vega Withrow RN CCM Case Mgmt phone [phone]   12/10/2017 1725 NCM received call back from pt's cousin, Shari Prowsvan. Pt lives in his home alone. He is having increased weakness and difficulty with managing at home. Pt was able to manage at home in the past giving himself sponge bath and transferring to his bedside commode. He was using his wheelchair. He has a aide that comes Monday-Friday from 1-3 pm and aide that comes in the evening to give fix meal. Updated ED attending. Will have PT/OT eval in am and CSW/NCM follow up on dc plan. Isidoro DonningAlesia Ilisha Blust RN CCM Case Mgmt phone [phone]

## 2017-12-10 NOTE — Progress Notes (Addendum)
CSW met with pt who is requesting placement and states, "I can't go home, I don't feel safe".  CSW staffed with EPD who stated pt has no acute medical, rehab-able criteria/conditions, but exhibits generalized weakness and an increasingly difficult time "getting around in his apartment which insurance companies consider custodial..  Per EPD, pt is wheelchair bound.  CSW explained to pt he does not meet criteria that his primary insurance uses to authorize short-term rehab/SNF days.  CSW reviewed chart and staffed with CSW Asst Director who requested the following be established:  CSW met with pt and confirmed pt has been turned down twice for Medicaid due to pt, "making too much", per the pt.  Per the pt, pt makes approx $1300 month and that this included the pt's pension.  Per the pt, he owns no home, one car of little value and no assessts.  Pt stated he is willing to seek long-term permanent placement and apply for Medicaid, and "sign over" his social security/pension check if this will assist the pt in living in long-term facility for permanent placement.  CSW advised CSW Asst Director and is  CSW awaiting return call from leadership.  CSW will continue to follow for D/C needs.  Timothy Gross. Timothy Couvillon, LCSW, LCAS, CSI Clinical Social Worker Ph: [phone]

## 2017-12-11 LAB — CBG MONITORING, ED
GLUCOSE-CAPILLARY: 297 mg/dL — AB (ref 70–99)
GLUCOSE-CAPILLARY: 183 mg/dL — AB (ref 70–99)
GLUCOSE-CAPILLARY: 147 mg/dL — AB (ref 70–99)
Glucose-Capillary: 242 mg/dL — ABNORMAL HIGH (ref 70–99)

## 2017-12-11 NOTE — Evaluation (Signed)
Occupational Therapy Evaluation Patient Details Name: Timothy Gross MRN: [id number] DOB: [birth year] Today's Date: 12/11/2017    History of Present Illness 77 yo male admitted to ED with abd pain and weakness, Hx of polio syndrome, DM, A fib, cervical myelopathy.    Clinical Impression   Pt was admitted for the above. At baseline, he is mod I for toileting and ADLs (setting himself up from power w/c).  Pt need mod A for bed mobility and could not weight shift for adls nor to place sliding board without a lot of assistance. He will benefit from continued OT to increase safety and independence with adls. Will follow in acute setting with the goals below and recommend continued OT at SNF.    Follow Up Recommendations  SNF    Equipment Recommendations  None recommended by OT    Recommendations for Other Services       Precautions / Restrictions Precautions Precautions: Fall Precaution Comments: bil LE weakness. Restrictions Weight Bearing Restrictions: No Other Position/Activity Restrictions: pt does not stand      Mobility Bed Mobility Overal bed mobility: Needs Assistance Bed Mobility: Supine to Sit;Sit to Supine;Rolling Rolling: Mod assist   Supine to sit: Mod assist;HOB elevated Sit to supine: Mod assist;HOB elevated   General bed mobility comments: Increased time. Cues for safety. HOB elevation and L bedrail/R siderail use required for pt to get to EOB.   Transfers Overall transfer level: Needs assistance   Transfers: Lateral/Scoot Transfers          Lateral/Scoot Transfers: Total assist;+2 physical assistance;+2 safety/equipment General transfer comment: Total +2 and use of bedpad/sheet to scoot anteriorly and to achieve angle needed for SB use. Pt unable to weightshift enough in lateral directions to allow proper positioning of sliding board. Pt then unable to tolerate postioning of SB (genitals). Also note pt bil UEs too weak to safely complete transfer     Balance Overall balance assessment: Needs assistance Sitting-balance support: Feet supported;Bilateral upper extremity supported Sitting balance-Leahy Scale: Poor Sitting balance - Comments: Poor dynamic trunk control/strength. Pt was able to sit statically with bil UE support. When positioned into near sidelying for SB position, pt unable to return to sitting without Mod assist.                                    ADL either performed or assessed with clinical judgement   ADL Overall ADL's : Needs assistance/impaired Eating/Feeding: Independent   Grooming: Set up;Bed level   Upper Body Bathing: Set up;Bed level   Lower Body Bathing: Maximal assistance;Bed level   Upper Body Dressing : Minimal assistance;Sitting   Lower Body Dressing: Total assistance;Bed level                 General ADL Comments: pt usually sits eob for adls and weight shifts. Needed A to get to EOB and unable to weight shift for adls. Did weight shift with assistance to place sliding board     Vision         Perception     Praxis      Pertinent Vitals/Pain Pain Assessment: Faces Faces Pain Scale: Hurts little more Pain Location: back Pain Descriptors / Indicators: Sore;Aching Pain Intervention(s): Limited activity within patient's tolerance;Monitored during session;Repositioned     Hand Dominance Right   Extremity/Trunk Assessment Upper Extremity Assessment Upper Extremity Assessment: Generalized weakness 2-/5 shoulders 3+/5 distally   Lower  Extremity Assessment Lower Extremity Assessment: RLE deficits/detail;LLE deficits/detail RLE Deficits / Details: no functional use of LEs RLE Coordination: decreased fine motor;decreased gross motor LLE Deficits / Details: no functional use of LEs LLE Coordination: decreased gross motor;decreased fine motor   Cervical / Trunk Assessment Cervical / Trunk Assessment: Normal   Communication Communication Communication: No  difficulties   Cognition Arousal/Alertness: Awake/alert Behavior During Therapy: WFL for tasks assessed/performed Overall Cognitive Status: Within Functional Limits for tasks assessed                                     General Comments       Exercises     Shoulder Instructions      Home Living Family/patient expects to be discharged to:: Unsure Living Arrangements: Alone Available Help at Discharge: Family;Personal care attendant Type of Home: House Home Access: Ramped entrance     Home Layout: One level     Bathroom Shower/Tub: Chief Strategy Officer: Standard     Home Equipment: Bedside commode;Wheelchair - power;Wheelchair - manual(drop arm commode)          Prior Functioning/Environment Level of Independence: Needs assistance  Gait / Transfers Assistance Needed: uses power chair. SB xfers to [facility] and WC ADL's / Homemaking Assistance Needed: completes adl with set up/sit and weight shift            OT Problem List: Decreased strength;Decreased range of motion;Decreased activity tolerance;Impaired balance (sitting and/or standing);Pain      OT Treatment/Interventions: Self-care/ADL training;Therapeutic exercise;DME and/or AE instruction;Energy conservation;Balance training;Patient/family education;Therapeutic activities    OT Goals(Current goals can be found in the care plan section) Acute Rehab OT Goals Patient Stated Goal: to go to rehab to get stronger/regain PLOF OT Goal Formulation: With patient Time For Goal Achievement: 12/25/17 Potential to Achieve Goals: Good ADL Goals Additional ADL Goal #1: pt will sit eob and perform UB adls with min guard and weight shift with min A for peri care Additional ADL Goal #2: pt will perform bed mobility with min A with HOB raised and grab bar in preparation for toilet transfers Additional ADL Goal #3: pt will tolerate 20 reps x 3 shoulder motions to increase strength for adls  OT  Frequency: Min 2X/week   Barriers to D/C:            Co-evaluation              AM-PAC PT "6 Clicks" Daily Activity     Outcome Measure Help from another person eating meals?: None Help from another person taking care of personal grooming?: A Little Help from another person toileting, which includes using toliet, bedpan, or urinal?: A Lot Help from another person bathing (including washing, rinsing, drying)?: A Lot Help from another person to put on and taking off regular upper body clothing?: A Little Help from another person to put on and taking off regular lower body clothing?: Total 6 Click Score: 15   End of Session Nurse Communication: Mobility status  Activity Tolerance: Patient tolerated treatment well Patient left: in bed;with call bell/phone within reach  OT Visit Diagnosis: Muscle weakness (generalized) (M62.81)                Time: 7829-5621 OT Time Calculation (min): 45 min Charges:  OT General Charges $OT Visit: 1 Visit OT Evaluation $OT Eval Moderate Complexity: 1 Mod G-Codes:     Marica Otter,  OTR/L [phone] 12/11/2017  Evanee Lubrano 12/11/2017, 10:49 AM

## 2017-12-11 NOTE — Progress Notes (Signed)
CSW aware of consult- currently awaiting bed offers for patient. [facility] has [facility] which requires prior authorization for SNF placement. Authorization can take up to 48 hours.   PASRR number is: [id number] [id number]  Stacy GardnerErin Devory Mckinzie, LCSW Emergency Room Clinical Social Worker [phone]

## 2017-12-11 NOTE — Progress Notes (Signed)
CSW spoke with EDP regarding SNF placement, CSW notified EDP regarding barriers to placement. Patient has [employer] which requires prior authorization for placement (can take up to 48 hours). CSW is unsure if patient will receive authorization due to being wheelchair bound at baseline. EDP feels it's best to keep patient due to recent hospitalization, however understands authorization may not happen.   CSW met with patient at bedside. CSW informed patient physical therapy recommended SNF placement. Patient agreeable to placement at this time. Patient has been to facility prior but could not remember the facility at the time. [facility] are able to offer placement, patient is agreeable to either facility and understand the importance of obtaining insurance authorization. CSW notified facility liasion to start authorization process. Patient agreeable to this. CSW will continue to notify patient of updates.  Ollen Barges, [place] Work Department  [employer]  [phone]

## 2017-12-11 NOTE — Evaluation (Signed)
Physical Therapy Evaluation Patient Details Name: Timothy Gross L Purdum MRN: [id number] DOB: 1940-12-18 Today's Date: 12/11/2017   History of Present Illness  77 yo male admitted to ED with abd pain and weakness, Hx of polio syndrome, DM, A fib, cervical myelopathy.   Clinical Impression  Evaluated in ED. On eval, pt required Mod assist for bed mobility and Total assist +2 for any attempts at scooting. Pt was unable to safely transfer from bed to wheelchair on today, even with +2 assist, due to weakness. Assisted back to bed. Pt presents with general weakness, decreased activity tolerance, impaired balance. Pt appears to be significantly weaker than his baseline. Recommend SNF for continued rehab.     Follow Up Recommendations SNF    Equipment Recommendations  None recommended by PT    Recommendations for Other Services       Precautions / Restrictions Precautions Precautions: Fall Precaution Comments: bil LE weakness.      Mobility  Bed Mobility Overal bed mobility: Needs Assistance Bed Mobility: Supine to Sit;Sit to Supine;Rolling Rolling: Mod assist   Supine to sit: Mod assist;HOB elevated Sit to supine: Mod assist;HOB elevated   General bed mobility comments: Increased time. Cues for safety. HOB elevation and L bedrail/R siderail use required for pt to get to EOB.   Transfers Overall transfer level: Needs assistance   Transfers: Lateral/Scoot Transfers          Lateral/Scoot Transfers: Total assist;+2 physical assistance;+2 safety/equipment General transfer comment: Total +2 and use of bedpad/sheet to scoot anteriorly and to achieve angle needed for SB use. Pt unable to weightshift enough in lateral directions to allow proper positioning of sliding board. Pt then unable to tolerate postioning of SB (genital pain). Also note pt bil UEs too weak to safely complete transfer.  Ambulation/Gait             General Gait Details: nonambulatory  Stairs             Wheelchair Mobility    Modified Rankin (Stroke Patients Only)       Balance Overall balance assessment: Needs assistance Sitting-balance support: Feet supported;Bilateral upper extremity supported Sitting balance-Leahy Scale: Poor Sitting balance - Comments: Poor dynamic trunk control/strength. Pt was able to sit statically with bil UE support. When positioned into near sidelying for SB position, pt unable to return to sitting without Mod assist.                                      Pertinent Vitals/Pain Pain Assessment: Faces Faces Pain Scale: Hurts little more Pain Location: back Pain Descriptors / Indicators: Sore;Aching Pain Intervention(s): Monitored during session;Repositioned    Home Living Family/patient expects to be discharged to:: Unsure Living Arrangements: Alone Available Help at Discharge: Family;Personal care attendant Type of Home: House Home Access: Ramped entrance     Home Layout: One level Home Equipment: Bedside commode;Wheelchair - power;Wheelchair - manual      Prior Function Level of Independence: Needs assistance   Gait / Transfers Assistance Needed: uses power chair. [facility]           Hand Dominance        Extremity/Trunk Assessment   Upper Extremity Assessment Upper Extremity Assessment: Defer to OT evaluation    Lower Extremity Assessment Lower Extremity Assessment: RLE deficits/detail;LLE deficits/detail RLE Deficits / Details: no functional use of LEs RLE Coordination: decreased fine motor;decreased  gross motor LLE Deficits / Details: no functional use of LEs LLE Coordination: decreased gross motor;decreased fine motor    Cervical / Trunk Assessment Cervical / Trunk Assessment: Normal  Communication   Communication: No difficulties  Cognition Arousal/Alertness: Awake/alert Behavior During Therapy: WFL for tasks assessed/performed Overall Cognitive Status: Within Functional Limits for  tasks assessed                                        General Comments      Exercises     Assessment/Plan    PT Assessment Patient needs continued PT services  PT Problem List Decreased strength;Decreased mobility;Decreased balance;Decreased knowledge of use of DME       PT Treatment Interventions DME instruction;Therapeutic activities;Therapeutic exercise;Patient/family education;Balance training;Functional mobility training    PT Goals (Current goals can be found in the Care Plan section)  Acute Rehab PT Goals Patient Stated Goal: to go to rehab to get stronger/regain PLOF PT Goal Formulation: With patient Time For Goal Achievement: 12/25/17 Potential to Achieve Goals: Fair    Frequency Min 2X/week   Barriers to discharge        Co-evaluation               AM-PAC PT "6 Clicks" Daily Activity  Outcome Measure Difficulty turning over in bed (including adjusting bedclothes, sheets and blankets)?: Unable Difficulty moving from lying on back to sitting on the side of the bed? : Unable Difficulty sitting down on and standing up from a chair with arms (e.g., wheelchair, bedside commode, etc,.)?: Unable Help needed moving to and from a bed to chair (including a wheelchair)?: Total Help needed walking in hospital room?: Total Help needed climbing 3-5 steps with a railing? : Total 6 Click Score: 6    End of Session   Activity Tolerance: Patient tolerated treatment well Patient left: in bed;with call bell/phone within reach   PT Visit Diagnosis: Muscle weakness (generalized) (M62.81);Other abnormalities of gait and mobility (R26.89)    Time: 1610-9604 PT Time Calculation (min) (ACUTE ONLY): 46 min   Charges:   PT Evaluation $PT Eval Moderate Complexity: 1 Mod     PT G Codes:          Rebeca Alert, MPT Pager: [phone]

## 2017-12-11 NOTE — ED Notes (Signed)
Bed: WA29 Expected date:  Expected time:  Means of arrival:  Comments: Room 23

## 2017-12-11 NOTE — [place] FL2 (Signed)
[place] MEDICAID FL2 LEVEL OF CARE SCREENING TOOL     IDENTIFICATION  Patient Name: Timothy Gross Birthdate: 07/08/1940 Sex: male Admission Date (Current Location): 12/09/2017  [facility] and [place] Number:  Producer, television/film/video and Address:  [facility],  [address] [place] [address]      Provider Number: [id number]  Attending Physician Name and Address:  Default, Provider, MD  Relative Name and Phone Number:       Current Level of Care: Hospital Recommended Level of Care: Skilled Nursing Facility Prior Approval Number:    Date Approved/Denied:   PASRR Number: [id number]  Discharge Plan: SNF    Current Diagnoses: Patient Active Problem List   Diagnosis Date Noted  . Atrial fibrillation with RVR (HCC) 08/04/2016  . Symptomatic cholelithiasis 09/09/2011  . Constipation 09/09/2011  . Hyponatremia 07/28/2011  . Elevated LFTs 07/28/2011  . Abdominal pain 07/28/2011  . Generalized weakness 07/28/2011  . Dehydration 07/28/2011  . Hypertension   . History of UTI   . Iron deficiency anemia   . DM (diabetes mellitus), type 2, uncontrolled (HCC)   . Hyperlipidemia   . GERD (gastroesophageal reflux disease)   . History of post-polio syndrome     Orientation RESPIRATION BLADDER Height & Weight     Self, Time, Situation, Place  Normal Continent Weight: 200 lb (90.7 kg) Height:  5\' 11"  (180.3 cm)  BEHAVIORAL SYMPTOMS/MOOD NEUROLOGICAL BOWEL NUTRITION STATUS      Continent Diet(Regular)  AMBULATORY STATUS COMMUNICATION OF NEEDS Skin   Total Care(Non-ambulatory) Verbally Normal                       Personal Care Assistance Level of Assistance  Bathing, Feeding, Dressing, Total care Bathing Assistance: Maximum assistance Feeding assistance: Independent Dressing Assistance: Maximum assistance Total Care Assistance: Maximum assistance   Functional Limitations Info             SPECIAL CARE FACTORS FREQUENCY  PT (By licensed PT), OT  (By licensed OT)     PT Frequency: 5 OT Frequency: 5            Contractures      Additional Factors Info  Code Status, Allergies Code Status Info: Prior Allergies Info: DIazepam           Current Medications (12/11/2017):  This is the current hospital active medication list Current Facility-Administered Medications  Medication Dose Route Frequency Provider Last Rate Last Dose  . acetaminophen (TYLENOL) tablet 1,000 mg  1,000 mg Oral Q6H PRN Long, Arlyss Repress, MD   1,000 mg at 12/11/17 1125  . benazepril (LOTENSIN) tablet 40 mg  40 mg Oral Daily Long, Arlyss Repress, MD   40 mg at 12/11/17 1126  . hydrochlorothiazide (HYDRODIURIL) tablet 25 mg  25 mg Oral Daily Long, Arlyss Repress, MD   25 mg at 12/11/17 1125  . insulin aspart (novoLOG) injection 0-15 Units  0-15 Units Subcutaneous TID WC Long, Arlyss Repress, MD   5 Units at 12/11/17 [phone]  . insulin detemir (LEVEMIR) injection 25 Units  25 Units Subcutaneous QHS Maia Plan, MD   25 Units at 12/10/17 2136  . metFORMIN (GLUCOPHAGE) tablet 1,000 mg  1,000 mg Oral BID Long, Arlyss Repress, MD   1,000 mg at 12/11/17 1047  . metoprolol tartrate (LOPRESSOR) tablet 12.5 mg  12.5 mg Oral Daily Long, Arlyss Repress, MD   12.5 mg at 12/11/17 1047  . pantoprazole (PROTONIX) EC tablet 40 mg  40  mg Oral BID Maia PlanLong, Joshua G, MD   40 mg at 12/11/17 1047  . polyvinyl alcohol (LIQUIFILM TEARS) 1.4 % ophthalmic solution 1 drop  1 drop Both Eyes TID PRN Long, Arlyss RepressJoshua G, MD      . simvastatin (ZOCOR) tablet 40 mg  40 mg Oral QPM Long, Arlyss RepressJoshua G, MD   40 mg at 12/10/17 [id number]   Current Outpatient Medications  Medication Sig Dispense Refill  . Aspirin-Salicylamide-Caffeine (BC FAST PAIN RELIEF) 650-195-33.3 MG PACK Take 1 packet by mouth every 4 (four) hours as needed (for pain).    . benazepril (LOTENSIN) 40 MG tablet Take 40 mg by mouth daily.     Marland Kitchen. dextran 70-hypromellose (TEARS RENEWED) ophthalmic solution Place 1 drop into both eyes 3 (three) times daily as needed for dry eyes.  For dry eyes    . hydrochlorothiazide (HYDRODIURIL) 25 MG tablet Take 25 mg by mouth daily.    Marland Kitchen. JANUVIA 100 MG tablet Take 100 mg by mouth daily.  2  . LEVEMIR FLEXTOUCH 100 UNIT/ML Pen Inject 25 Units into the skin at bedtime. None if BS is under 200  2  . metFORMIN (GLUCOPHAGE) 1000 MG tablet Take 1,000 mg by mouth 2 (two) times daily.  2  . metoprolol (LOPRESSOR) 25 MG tablet Take 0.5 tablets (12.5 mg total) by mouth daily. 60 tablet 1  . Multiple Vitamin (MULITIVITAMIN WITH MINERALS) TABS Take 1 tablet by mouth daily.    . pantoprazole (PROTONIX) 40 MG tablet Take 1 tablet (40 mg total) by mouth 2 (two) times daily. 60 tablet 0  . polyethylene glycol (MIRALAX) packet Take 17 g by mouth daily. 14 each 0  . simvastatin (ZOCOR) 40 MG tablet Take 40 mg by mouth every evening.       Discharge Medications: Please see discharge summary for a list of discharge medications.  Relevant Imaging Results:  Relevant Lab Results:   Additional Information [id number]  Archie BalboaMackenzie  Irwin, LCSW

## 2017-12-12 DIAGNOSIS — R293 Abnormal posture: Secondary | ICD-10-CM | POA: Diagnosis not present

## 2017-12-12 DIAGNOSIS — E782 Mixed hyperlipidemia: Secondary | ICD-10-CM | POA: Diagnosis not present

## 2017-12-12 DIAGNOSIS — R1031 Right lower quadrant pain: Secondary | ICD-10-CM | POA: Diagnosis not present

## 2017-12-12 DIAGNOSIS — R1310 Dysphagia, unspecified: Secondary | ICD-10-CM | POA: Diagnosis not present

## 2017-12-12 DIAGNOSIS — Z8673 Personal history of transient ischemic attack (TIA), and cerebral infarction without residual deficits: Secondary | ICD-10-CM | POA: Diagnosis not present

## 2017-12-12 DIAGNOSIS — Z743 Need for continuous supervision: Secondary | ICD-10-CM | POA: Diagnosis not present

## 2017-12-12 DIAGNOSIS — K219 Gastro-esophageal reflux disease without esophagitis: Secondary | ICD-10-CM | POA: Diagnosis not present

## 2017-12-12 DIAGNOSIS — M6281 Muscle weakness (generalized): Secondary | ICD-10-CM | POA: Diagnosis not present

## 2017-12-12 DIAGNOSIS — E440 Moderate protein-calorie malnutrition: Secondary | ICD-10-CM | POA: Diagnosis not present

## 2017-12-12 DIAGNOSIS — M24573 Contracture, unspecified ankle: Secondary | ICD-10-CM | POA: Diagnosis not present

## 2017-12-12 DIAGNOSIS — S72002A Fracture of unspecified part of neck of left femur, initial encounter for closed fracture: Secondary | ICD-10-CM | POA: Diagnosis not present

## 2017-12-12 DIAGNOSIS — L98411 Non-pressure chronic ulcer of buttock limited to breakdown of skin: Secondary | ICD-10-CM | POA: Diagnosis not present

## 2017-12-12 DIAGNOSIS — I10 Essential (primary) hypertension: Secondary | ICD-10-CM | POA: Diagnosis not present

## 2017-12-12 DIAGNOSIS — E119 Type 2 diabetes mellitus without complications: Secondary | ICD-10-CM | POA: Diagnosis not present

## 2017-12-12 DIAGNOSIS — I639 Cerebral infarction, unspecified: Secondary | ICD-10-CM | POA: Diagnosis not present

## 2017-12-12 DIAGNOSIS — R279 Unspecified lack of coordination: Secondary | ICD-10-CM | POA: Diagnosis not present

## 2017-12-12 DIAGNOSIS — S062X0A Diffuse traumatic brain injury without loss of consciousness, initial encounter: Secondary | ICD-10-CM | POA: Diagnosis not present

## 2017-12-12 DIAGNOSIS — G14 Postpolio syndrome: Secondary | ICD-10-CM | POA: Diagnosis not present

## 2017-12-12 DIAGNOSIS — R29898 Other symptoms and signs involving the musculoskeletal system: Secondary | ICD-10-CM | POA: Diagnosis not present

## 2017-12-12 LAB — CBG MONITORING, ED
GLUCOSE-CAPILLARY: 94 mg/dL (ref 70–99)
GLUCOSE-CAPILLARY: 78 mg/dL (ref 70–99)
Glucose-Capillary: 129 mg/dL — ABNORMAL HIGH (ref 70–99)

## 2017-12-12 NOTE — Progress Notes (Signed)
Report given to receiving nurse, [place] [facility]

## 2017-12-12 NOTE — Progress Notes (Signed)
CSW spoke with Tammy of [facility] who stated that patient has received insurance authorization for their facility. Patient is accepted to facility for today 7/9. Patient to be transported via [facility] to [facility] once FL-2 is signed by EDP.  Please call report to [phone]. Room number is 153A.  Archie BalboaMackenzie Irwin, LCSWA  Clinical Social Work Department  [facility]  [phone]

## 2017-12-12 NOTE — NC FL2 (Addendum)
[place] MEDICAID FL2 LEVEL OF CARE SCREENING TOOL     IDENTIFICATION  Patient Name: Timothy Gross Birthdate: [birth year] Sex: male Admission Date (Current Location): 12/09/2017  [facility] and [place] Number:  Producer, television/film/video and Address:  [facility],  [address] [place] [address]      Provider Number: [id number]  Attending Physician Name and Address:  Default, Provider, MD  Relative Name and Phone Number:       Current Level of Care: Hospital Recommended Level of Care: Skilled Nursing Facility Prior Approval Number:    Date Approved/Denied:   PASRR Number: [id number]  Discharge Plan: SNF    Current Diagnoses: Patient Active Problem List   Diagnosis Date Noted  . Atrial fibrillation with RVR (HCC) 08/04/2016  . Symptomatic cholelithiasis 09/09/2011  . Constipation 09/09/2011  . Hyponatremia 07/28/2011  . Elevated LFTs 07/28/2011  . Abdominal pain 07/28/2011  . Generalized weakness 07/28/2011  . Dehydration 07/28/2011  . Hypertension   . History of UTI   . Iron deficiency anemia   . DM (diabetes mellitus), type 2, uncontrolled (HCC)   . Hyperlipidemia   . GERD (gastroesophageal reflux disease)   . History of post-polio syndrome     Orientation RESPIRATION BLADDER Height & Weight     Self, Time, Situation, Place  Normal Continent Weight: 200 lb (90.7 kg) Height:  5\' 11"  (180.3 cm)  BEHAVIORAL SYMPTOMS/MOOD NEUROLOGICAL BOWEL NUTRITION STATUS      Continent Diet(Regular)  AMBULATORY STATUS COMMUNICATION OF NEEDS Skin   Total Care(Non-ambulatory) Verbally Normal                       Personal Care Assistance Level of Assistance  Bathing, Feeding, Dressing, Total care Bathing Assistance: Maximum assistance Feeding assistance: Independent Dressing Assistance: Maximum assistance Total Care Assistance: Maximum assistance   Functional Limitations Info             SPECIAL CARE FACTORS FREQUENCY  PT (By licensed PT), OT  (By licensed OT)     PT Frequency: 5 OT Frequency: 5            Contractures      Additional Factors Info  Code Status, Allergies Code Status Info: Prior Allergies Info: DIazepam           Current Medications (12/12/2017):  This is the current hospital active medication list Current Facility-Administered Medications  Medication Dose Route Frequency Provider Last Rate Last Dose  . acetaminophen (TYLENOL) tablet 1,000 mg  1,000 mg Oral Q6H PRN Long, Arlyss Repress, MD   1,000 mg at 12/12/17 0808  . benazepril (LOTENSIN) tablet 40 mg  40 mg Oral Daily Long, Arlyss Repress, MD   40 mg at 12/12/17 1122  . hydrochlorothiazide (HYDRODIURIL) tablet 25 mg  25 mg Oral Daily Long, Arlyss Repress, MD   25 mg at 12/12/17 1124  . insulin aspart (novoLOG) injection 0-15 Units  0-15 Units Subcutaneous TID WC Long, Arlyss Repress, MD   2 Units at 12/12/17 1305  . insulin detemir (LEVEMIR) injection 25 Units  25 Units Subcutaneous QHS Maia Plan, MD   25 Units at 12/11/17 2159  . metFORMIN (GLUCOPHAGE) tablet 1,000 mg  1,000 mg Oral BID Long, Arlyss Repress, MD   1,000 mg at 12/12/17 1125  . metoprolol tartrate (LOPRESSOR) tablet 12.5 mg  12.5 mg Oral Daily Long, Arlyss Repress, MD   12.5 mg at 12/12/17 1123  . pantoprazole (PROTONIX) EC tablet 40 mg  40  mg Oral BID Maia PlanLong, Joshua G, MD   40 mg at 12/12/17 1125  . polyvinyl alcohol (LIQUIFILM TEARS) 1.4 % ophthalmic solution 1 drop  1 drop Both Eyes TID PRN Long, Arlyss RepressJoshua G, MD      . simvastatin (ZOCOR) tablet 40 mg  40 mg Oral QPM Long, Arlyss RepressJoshua G, MD   40 mg at 12/11/17 1735   Current Outpatient Medications  Medication Sig Dispense Refill  . Aspirin-Salicylamide-Caffeine (BC FAST PAIN RELIEF) 650-195-33.3 MG PACK Take 1 packet by mouth every 4 (four) hours as needed (for pain).    . benazepril (LOTENSIN) 40 MG tablet Take 40 mg by mouth daily.     Marland Kitchen. dextran 70-hypromellose (TEARS RENEWED) ophthalmic solution Place 1 drop into both eyes 3 (three) times daily as needed for dry eyes.  For dry eyes    . hydrochlorothiazide (HYDRODIURIL) 25 MG tablet Take 25 mg by mouth daily.    Marland Kitchen. JANUVIA 100 MG tablet Take 100 mg by mouth daily.  2  . LEVEMIR FLEXTOUCH 100 UNIT/ML Pen Inject 25 Units into the skin at bedtime. None if BS is under 200  2  . metFORMIN (GLUCOPHAGE) 1000 MG tablet Take 1,000 mg by mouth 2 (two) times daily.  2  . metoprolol (LOPRESSOR) 25 MG tablet Take 0.5 tablets (12.5 mg total) by mouth daily. 60 tablet 1  . Multiple Vitamin (MULITIVITAMIN WITH MINERALS) TABS Take 1 tablet by mouth daily.    . pantoprazole (PROTONIX) 40 MG tablet Take 1 tablet (40 mg total) by mouth 2 (two) times daily. 60 tablet 0  . polyethylene glycol (MIRALAX) packet Take 17 g by mouth daily. 14 each 0  . simvastatin (ZOCOR) 40 MG tablet Take 40 mg by mouth every evening.       Discharge Medications: Please see discharge summary for a list of discharge medications.  Relevant Imaging Results:  Relevant Lab Results:   Additional Information [id number]  Archie BalboaMackenzie  Irwin, LCSW

## 2017-12-13 DIAGNOSIS — E119 Type 2 diabetes mellitus without complications: Secondary | ICD-10-CM | POA: Diagnosis not present

## 2017-12-13 DIAGNOSIS — G14 Postpolio syndrome: Secondary | ICD-10-CM | POA: Diagnosis not present

## 2017-12-13 DIAGNOSIS — I10 Essential (primary) hypertension: Secondary | ICD-10-CM | POA: Diagnosis not present

## 2017-12-13 DIAGNOSIS — E782 Mixed hyperlipidemia: Secondary | ICD-10-CM | POA: Diagnosis not present

## 2017-12-15 DIAGNOSIS — E782 Mixed hyperlipidemia: Secondary | ICD-10-CM | POA: Diagnosis not present

## 2017-12-15 DIAGNOSIS — E119 Type 2 diabetes mellitus without complications: Secondary | ICD-10-CM | POA: Diagnosis not present

## 2017-12-15 DIAGNOSIS — G14 Postpolio syndrome: Secondary | ICD-10-CM | POA: Diagnosis not present

## 2017-12-18 DIAGNOSIS — I10 Essential (primary) hypertension: Secondary | ICD-10-CM | POA: Diagnosis not present

## 2017-12-18 DIAGNOSIS — K219 Gastro-esophageal reflux disease without esophagitis: Secondary | ICD-10-CM | POA: Diagnosis not present

## 2017-12-18 DIAGNOSIS — E782 Mixed hyperlipidemia: Secondary | ICD-10-CM | POA: Diagnosis not present

## 2017-12-18 DIAGNOSIS — E119 Type 2 diabetes mellitus without complications: Secondary | ICD-10-CM | POA: Diagnosis not present

## 2017-12-26 DIAGNOSIS — L98411 Non-pressure chronic ulcer of buttock limited to breakdown of skin: Secondary | ICD-10-CM | POA: Diagnosis not present

## 2017-12-26 DIAGNOSIS — I10 Essential (primary) hypertension: Secondary | ICD-10-CM | POA: Diagnosis not present

## 2017-12-26 DIAGNOSIS — M6281 Muscle weakness (generalized): Secondary | ICD-10-CM | POA: Diagnosis not present

## 2017-12-27 DIAGNOSIS — E782 Mixed hyperlipidemia: Secondary | ICD-10-CM | POA: Diagnosis not present

## 2017-12-27 DIAGNOSIS — E119 Type 2 diabetes mellitus without complications: Secondary | ICD-10-CM | POA: Diagnosis not present

## 2017-12-27 DIAGNOSIS — K219 Gastro-esophageal reflux disease without esophagitis: Secondary | ICD-10-CM | POA: Diagnosis not present

## 2017-12-27 DIAGNOSIS — I10 Essential (primary) hypertension: Secondary | ICD-10-CM | POA: Diagnosis not present

## 2018-01-02 DIAGNOSIS — I10 Essential (primary) hypertension: Secondary | ICD-10-CM | POA: Diagnosis not present

## 2018-01-02 DIAGNOSIS — M6281 Muscle weakness (generalized): Secondary | ICD-10-CM | POA: Diagnosis not present

## 2018-01-02 DIAGNOSIS — L98411 Non-pressure chronic ulcer of buttock limited to breakdown of skin: Secondary | ICD-10-CM | POA: Diagnosis not present

## 2018-01-03 DIAGNOSIS — K219 Gastro-esophageal reflux disease without esophagitis: Secondary | ICD-10-CM | POA: Diagnosis not present

## 2018-01-03 DIAGNOSIS — E119 Type 2 diabetes mellitus without complications: Secondary | ICD-10-CM | POA: Diagnosis not present

## 2018-01-03 DIAGNOSIS — E782 Mixed hyperlipidemia: Secondary | ICD-10-CM | POA: Diagnosis not present

## 2018-01-03 DIAGNOSIS — I10 Essential (primary) hypertension: Secondary | ICD-10-CM | POA: Diagnosis not present

## 2018-01-09 DIAGNOSIS — M6281 Muscle weakness (generalized): Secondary | ICD-10-CM | POA: Diagnosis not present

## 2018-01-09 DIAGNOSIS — L98411 Non-pressure chronic ulcer of buttock limited to breakdown of skin: Secondary | ICD-10-CM | POA: Diagnosis not present

## 2018-01-09 DIAGNOSIS — I10 Essential (primary) hypertension: Secondary | ICD-10-CM | POA: Diagnosis not present

## 2018-01-09 DIAGNOSIS — E119 Type 2 diabetes mellitus without complications: Secondary | ICD-10-CM | POA: Diagnosis not present

## 2018-01-11 DIAGNOSIS — K219 Gastro-esophageal reflux disease without esophagitis: Secondary | ICD-10-CM | POA: Diagnosis not present

## 2018-01-11 DIAGNOSIS — E119 Type 2 diabetes mellitus without complications: Secondary | ICD-10-CM | POA: Diagnosis not present

## 2018-01-11 DIAGNOSIS — E782 Mixed hyperlipidemia: Secondary | ICD-10-CM | POA: Diagnosis not present

## 2018-01-11 DIAGNOSIS — I10 Essential (primary) hypertension: Secondary | ICD-10-CM | POA: Diagnosis not present

## 2018-01-15 DIAGNOSIS — I10 Essential (primary) hypertension: Secondary | ICD-10-CM | POA: Diagnosis not present

## 2018-01-15 DIAGNOSIS — K219 Gastro-esophageal reflux disease without esophagitis: Secondary | ICD-10-CM | POA: Diagnosis not present

## 2018-01-15 DIAGNOSIS — E782 Mixed hyperlipidemia: Secondary | ICD-10-CM | POA: Diagnosis not present

## 2018-01-15 DIAGNOSIS — E119 Type 2 diabetes mellitus without complications: Secondary | ICD-10-CM | POA: Diagnosis not present

## 2018-01-16 DIAGNOSIS — E119 Type 2 diabetes mellitus without complications: Secondary | ICD-10-CM | POA: Diagnosis not present

## 2018-01-16 DIAGNOSIS — I10 Essential (primary) hypertension: Secondary | ICD-10-CM | POA: Diagnosis not present

## 2018-01-16 DIAGNOSIS — L98411 Non-pressure chronic ulcer of buttock limited to breakdown of skin: Secondary | ICD-10-CM | POA: Diagnosis not present

## 2018-01-16 DIAGNOSIS — M6281 Muscle weakness (generalized): Secondary | ICD-10-CM | POA: Diagnosis not present

## 2018-01-19 DIAGNOSIS — S31839D Unspecified open wound of anus, subsequent encounter: Secondary | ICD-10-CM | POA: Diagnosis not present

## 2018-01-23 DIAGNOSIS — L98411 Non-pressure chronic ulcer of buttock limited to breakdown of skin: Secondary | ICD-10-CM | POA: Diagnosis not present

## 2018-01-23 DIAGNOSIS — I10 Essential (primary) hypertension: Secondary | ICD-10-CM | POA: Diagnosis not present

## 2018-01-23 DIAGNOSIS — E119 Type 2 diabetes mellitus without complications: Secondary | ICD-10-CM | POA: Diagnosis not present

## 2018-01-23 DIAGNOSIS — M6281 Muscle weakness (generalized): Secondary | ICD-10-CM | POA: Diagnosis not present

## 2018-02-02 DIAGNOSIS — R69 Illness, unspecified: Secondary | ICD-10-CM | POA: Diagnosis not present

## 2018-02-06 DIAGNOSIS — L98411 Non-pressure chronic ulcer of buttock limited to breakdown of skin: Secondary | ICD-10-CM | POA: Diagnosis not present

## 2018-02-06 DIAGNOSIS — I10 Essential (primary) hypertension: Secondary | ICD-10-CM | POA: Diagnosis not present

## 2018-02-06 DIAGNOSIS — M6281 Muscle weakness (generalized): Secondary | ICD-10-CM | POA: Diagnosis not present

## 2018-02-06 DIAGNOSIS — E119 Type 2 diabetes mellitus without complications: Secondary | ICD-10-CM | POA: Diagnosis not present

## 2018-02-08 DIAGNOSIS — I10 Essential (primary) hypertension: Secondary | ICD-10-CM | POA: Diagnosis not present

## 2018-02-08 DIAGNOSIS — K219 Gastro-esophageal reflux disease without esophagitis: Secondary | ICD-10-CM | POA: Diagnosis not present

## 2018-02-08 DIAGNOSIS — E119 Type 2 diabetes mellitus without complications: Secondary | ICD-10-CM | POA: Diagnosis not present

## 2018-02-12 DIAGNOSIS — E119 Type 2 diabetes mellitus without complications: Secondary | ICD-10-CM | POA: Diagnosis not present

## 2018-02-12 DIAGNOSIS — E782 Mixed hyperlipidemia: Secondary | ICD-10-CM | POA: Diagnosis not present

## 2018-02-12 DIAGNOSIS — K219 Gastro-esophageal reflux disease without esophagitis: Secondary | ICD-10-CM | POA: Diagnosis not present

## 2018-02-12 DIAGNOSIS — R05 Cough: Secondary | ICD-10-CM | POA: Diagnosis not present

## 2018-02-12 DIAGNOSIS — I10 Essential (primary) hypertension: Secondary | ICD-10-CM | POA: Diagnosis not present

## 2018-02-13 DIAGNOSIS — E119 Type 2 diabetes mellitus without complications: Secondary | ICD-10-CM | POA: Diagnosis not present

## 2018-02-13 DIAGNOSIS — I10 Essential (primary) hypertension: Secondary | ICD-10-CM | POA: Diagnosis not present

## 2018-02-13 DIAGNOSIS — L98411 Non-pressure chronic ulcer of buttock limited to breakdown of skin: Secondary | ICD-10-CM | POA: Diagnosis not present

## 2018-02-13 DIAGNOSIS — M6281 Muscle weakness (generalized): Secondary | ICD-10-CM | POA: Diagnosis not present

## 2018-03-12 DIAGNOSIS — G14 Postpolio syndrome: Secondary | ICD-10-CM | POA: Diagnosis not present

## 2018-03-12 DIAGNOSIS — I10 Essential (primary) hypertension: Secondary | ICD-10-CM | POA: Diagnosis not present

## 2018-03-12 DIAGNOSIS — E782 Mixed hyperlipidemia: Secondary | ICD-10-CM | POA: Diagnosis not present

## 2018-03-12 DIAGNOSIS — E119 Type 2 diabetes mellitus without complications: Secondary | ICD-10-CM | POA: Diagnosis not present

## 2018-03-13 DIAGNOSIS — E119 Type 2 diabetes mellitus without complications: Secondary | ICD-10-CM | POA: Diagnosis not present

## 2018-03-13 DIAGNOSIS — E785 Hyperlipidemia, unspecified: Secondary | ICD-10-CM | POA: Diagnosis not present

## 2018-03-13 DIAGNOSIS — D649 Anemia, unspecified: Secondary | ICD-10-CM | POA: Diagnosis not present

## 2018-03-30 DIAGNOSIS — R69 Illness, unspecified: Secondary | ICD-10-CM | POA: Diagnosis not present

## 2018-04-05 DIAGNOSIS — E782 Mixed hyperlipidemia: Secondary | ICD-10-CM | POA: Diagnosis not present

## 2018-04-05 DIAGNOSIS — K219 Gastro-esophageal reflux disease without esophagitis: Secondary | ICD-10-CM | POA: Diagnosis not present

## 2018-04-05 DIAGNOSIS — E119 Type 2 diabetes mellitus without complications: Secondary | ICD-10-CM | POA: Diagnosis not present

## 2018-04-05 DIAGNOSIS — I10 Essential (primary) hypertension: Secondary | ICD-10-CM | POA: Diagnosis not present

## 2018-04-18 DIAGNOSIS — S72002A Fracture of unspecified part of neck of left femur, initial encounter for closed fracture: Secondary | ICD-10-CM | POA: Diagnosis not present

## 2018-04-18 DIAGNOSIS — M6281 Muscle weakness (generalized): Secondary | ICD-10-CM | POA: Diagnosis not present

## 2018-04-18 DIAGNOSIS — R293 Abnormal posture: Secondary | ICD-10-CM | POA: Diagnosis not present

## 2018-04-18 DIAGNOSIS — E440 Moderate protein-calorie malnutrition: Secondary | ICD-10-CM | POA: Diagnosis not present

## 2018-04-18 DIAGNOSIS — R1310 Dysphagia, unspecified: Secondary | ICD-10-CM | POA: Diagnosis not present

## 2018-04-18 DIAGNOSIS — S062X0A Diffuse traumatic brain injury without loss of consciousness, initial encounter: Secondary | ICD-10-CM | POA: Diagnosis not present

## 2018-04-19 DIAGNOSIS — M6281 Muscle weakness (generalized): Secondary | ICD-10-CM | POA: Diagnosis not present

## 2018-04-19 DIAGNOSIS — R293 Abnormal posture: Secondary | ICD-10-CM | POA: Diagnosis not present

## 2018-04-19 DIAGNOSIS — E440 Moderate protein-calorie malnutrition: Secondary | ICD-10-CM | POA: Diagnosis not present

## 2018-04-19 DIAGNOSIS — S062X0A Diffuse traumatic brain injury without loss of consciousness, initial encounter: Secondary | ICD-10-CM | POA: Diagnosis not present

## 2018-04-19 DIAGNOSIS — R1310 Dysphagia, unspecified: Secondary | ICD-10-CM | POA: Diagnosis not present

## 2018-04-19 DIAGNOSIS — S72002A Fracture of unspecified part of neck of left femur, initial encounter for closed fracture: Secondary | ICD-10-CM | POA: Diagnosis not present

## 2018-04-20 DIAGNOSIS — M6281 Muscle weakness (generalized): Secondary | ICD-10-CM | POA: Diagnosis not present

## 2018-04-20 DIAGNOSIS — R293 Abnormal posture: Secondary | ICD-10-CM | POA: Diagnosis not present

## 2018-04-20 DIAGNOSIS — S062X0A Diffuse traumatic brain injury without loss of consciousness, initial encounter: Secondary | ICD-10-CM | POA: Diagnosis not present

## 2018-04-20 DIAGNOSIS — E440 Moderate protein-calorie malnutrition: Secondary | ICD-10-CM | POA: Diagnosis not present

## 2018-04-20 DIAGNOSIS — R1310 Dysphagia, unspecified: Secondary | ICD-10-CM | POA: Diagnosis not present

## 2018-04-20 DIAGNOSIS — S72002A Fracture of unspecified part of neck of left femur, initial encounter for closed fracture: Secondary | ICD-10-CM | POA: Diagnosis not present

## 2018-04-23 DIAGNOSIS — E440 Moderate protein-calorie malnutrition: Secondary | ICD-10-CM | POA: Diagnosis not present

## 2018-04-23 DIAGNOSIS — S72002A Fracture of unspecified part of neck of left femur, initial encounter for closed fracture: Secondary | ICD-10-CM | POA: Diagnosis not present

## 2018-04-23 DIAGNOSIS — R293 Abnormal posture: Secondary | ICD-10-CM | POA: Diagnosis not present

## 2018-04-23 DIAGNOSIS — R1310 Dysphagia, unspecified: Secondary | ICD-10-CM | POA: Diagnosis not present

## 2018-04-23 DIAGNOSIS — M6281 Muscle weakness (generalized): Secondary | ICD-10-CM | POA: Diagnosis not present

## 2018-04-23 DIAGNOSIS — S062X0A Diffuse traumatic brain injury without loss of consciousness, initial encounter: Secondary | ICD-10-CM | POA: Diagnosis not present

## 2018-04-24 DIAGNOSIS — S72002A Fracture of unspecified part of neck of left femur, initial encounter for closed fracture: Secondary | ICD-10-CM | POA: Diagnosis not present

## 2018-04-24 DIAGNOSIS — M6281 Muscle weakness (generalized): Secondary | ICD-10-CM | POA: Diagnosis not present

## 2018-04-24 DIAGNOSIS — R293 Abnormal posture: Secondary | ICD-10-CM | POA: Diagnosis not present

## 2018-04-24 DIAGNOSIS — R1310 Dysphagia, unspecified: Secondary | ICD-10-CM | POA: Diagnosis not present

## 2018-04-24 DIAGNOSIS — E440 Moderate protein-calorie malnutrition: Secondary | ICD-10-CM | POA: Diagnosis not present

## 2018-04-24 DIAGNOSIS — R69 Illness, unspecified: Secondary | ICD-10-CM | POA: Diagnosis not present

## 2018-04-24 DIAGNOSIS — S062X0A Diffuse traumatic brain injury without loss of consciousness, initial encounter: Secondary | ICD-10-CM | POA: Diagnosis not present

## 2018-04-25 DIAGNOSIS — R1310 Dysphagia, unspecified: Secondary | ICD-10-CM | POA: Diagnosis not present

## 2018-04-25 DIAGNOSIS — S72002A Fracture of unspecified part of neck of left femur, initial encounter for closed fracture: Secondary | ICD-10-CM | POA: Diagnosis not present

## 2018-04-25 DIAGNOSIS — E440 Moderate protein-calorie malnutrition: Secondary | ICD-10-CM | POA: Diagnosis not present

## 2018-04-25 DIAGNOSIS — S062X0A Diffuse traumatic brain injury without loss of consciousness, initial encounter: Secondary | ICD-10-CM | POA: Diagnosis not present

## 2018-04-25 DIAGNOSIS — R293 Abnormal posture: Secondary | ICD-10-CM | POA: Diagnosis not present

## 2018-04-25 DIAGNOSIS — M6281 Muscle weakness (generalized): Secondary | ICD-10-CM | POA: Diagnosis not present

## 2018-04-26 DIAGNOSIS — S062X0A Diffuse traumatic brain injury without loss of consciousness, initial encounter: Secondary | ICD-10-CM | POA: Diagnosis not present

## 2018-04-26 DIAGNOSIS — M6281 Muscle weakness (generalized): Secondary | ICD-10-CM | POA: Diagnosis not present

## 2018-04-26 DIAGNOSIS — S72002A Fracture of unspecified part of neck of left femur, initial encounter for closed fracture: Secondary | ICD-10-CM | POA: Diagnosis not present

## 2018-04-26 DIAGNOSIS — R293 Abnormal posture: Secondary | ICD-10-CM | POA: Diagnosis not present

## 2018-04-26 DIAGNOSIS — R1310 Dysphagia, unspecified: Secondary | ICD-10-CM | POA: Diagnosis not present

## 2018-04-26 DIAGNOSIS — E440 Moderate protein-calorie malnutrition: Secondary | ICD-10-CM | POA: Diagnosis not present

## 2018-04-27 DIAGNOSIS — M6281 Muscle weakness (generalized): Secondary | ICD-10-CM | POA: Diagnosis not present

## 2018-04-27 DIAGNOSIS — R1310 Dysphagia, unspecified: Secondary | ICD-10-CM | POA: Diagnosis not present

## 2018-04-27 DIAGNOSIS — R293 Abnormal posture: Secondary | ICD-10-CM | POA: Diagnosis not present

## 2018-04-27 DIAGNOSIS — S72002A Fracture of unspecified part of neck of left femur, initial encounter for closed fracture: Secondary | ICD-10-CM | POA: Diagnosis not present

## 2018-04-27 DIAGNOSIS — S062X0A Diffuse traumatic brain injury without loss of consciousness, initial encounter: Secondary | ICD-10-CM | POA: Diagnosis not present

## 2018-04-27 DIAGNOSIS — E440 Moderate protein-calorie malnutrition: Secondary | ICD-10-CM | POA: Diagnosis not present

## 2018-04-30 DIAGNOSIS — R293 Abnormal posture: Secondary | ICD-10-CM | POA: Diagnosis not present

## 2018-04-30 DIAGNOSIS — R1310 Dysphagia, unspecified: Secondary | ICD-10-CM | POA: Diagnosis not present

## 2018-04-30 DIAGNOSIS — E440 Moderate protein-calorie malnutrition: Secondary | ICD-10-CM | POA: Diagnosis not present

## 2018-04-30 DIAGNOSIS — M6281 Muscle weakness (generalized): Secondary | ICD-10-CM | POA: Diagnosis not present

## 2018-04-30 DIAGNOSIS — S72002A Fracture of unspecified part of neck of left femur, initial encounter for closed fracture: Secondary | ICD-10-CM | POA: Diagnosis not present

## 2018-04-30 DIAGNOSIS — S062X0A Diffuse traumatic brain injury without loss of consciousness, initial encounter: Secondary | ICD-10-CM | POA: Diagnosis not present

## 2018-05-01 DIAGNOSIS — R1310 Dysphagia, unspecified: Secondary | ICD-10-CM | POA: Diagnosis not present

## 2018-05-01 DIAGNOSIS — E440 Moderate protein-calorie malnutrition: Secondary | ICD-10-CM | POA: Diagnosis not present

## 2018-05-01 DIAGNOSIS — R69 Illness, unspecified: Secondary | ICD-10-CM | POA: Diagnosis not present

## 2018-05-01 DIAGNOSIS — S062X0A Diffuse traumatic brain injury without loss of consciousness, initial encounter: Secondary | ICD-10-CM | POA: Diagnosis not present

## 2018-05-01 DIAGNOSIS — M6281 Muscle weakness (generalized): Secondary | ICD-10-CM | POA: Diagnosis not present

## 2018-05-01 DIAGNOSIS — R293 Abnormal posture: Secondary | ICD-10-CM | POA: Diagnosis not present

## 2018-05-01 DIAGNOSIS — S72002A Fracture of unspecified part of neck of left femur, initial encounter for closed fracture: Secondary | ICD-10-CM | POA: Diagnosis not present

## 2018-05-02 DIAGNOSIS — K219 Gastro-esophageal reflux disease without esophagitis: Secondary | ICD-10-CM | POA: Diagnosis not present

## 2018-05-02 DIAGNOSIS — S72002A Fracture of unspecified part of neck of left femur, initial encounter for closed fracture: Secondary | ICD-10-CM | POA: Diagnosis not present

## 2018-05-02 DIAGNOSIS — M6281 Muscle weakness (generalized): Secondary | ICD-10-CM | POA: Diagnosis not present

## 2018-05-02 DIAGNOSIS — S062X0A Diffuse traumatic brain injury without loss of consciousness, initial encounter: Secondary | ICD-10-CM | POA: Diagnosis not present

## 2018-05-02 DIAGNOSIS — E440 Moderate protein-calorie malnutrition: Secondary | ICD-10-CM | POA: Diagnosis not present

## 2018-05-02 DIAGNOSIS — I10 Essential (primary) hypertension: Secondary | ICD-10-CM | POA: Diagnosis not present

## 2018-05-02 DIAGNOSIS — E119 Type 2 diabetes mellitus without complications: Secondary | ICD-10-CM | POA: Diagnosis not present

## 2018-05-02 DIAGNOSIS — R293 Abnormal posture: Secondary | ICD-10-CM | POA: Diagnosis not present

## 2018-05-02 DIAGNOSIS — R1310 Dysphagia, unspecified: Secondary | ICD-10-CM | POA: Diagnosis not present

## 2018-05-04 DIAGNOSIS — R293 Abnormal posture: Secondary | ICD-10-CM | POA: Diagnosis not present

## 2018-05-04 DIAGNOSIS — S062X0A Diffuse traumatic brain injury without loss of consciousness, initial encounter: Secondary | ICD-10-CM | POA: Diagnosis not present

## 2018-05-04 DIAGNOSIS — S72002A Fracture of unspecified part of neck of left femur, initial encounter for closed fracture: Secondary | ICD-10-CM | POA: Diagnosis not present

## 2018-05-04 DIAGNOSIS — R1310 Dysphagia, unspecified: Secondary | ICD-10-CM | POA: Diagnosis not present

## 2018-05-04 DIAGNOSIS — E440 Moderate protein-calorie malnutrition: Secondary | ICD-10-CM | POA: Diagnosis not present

## 2018-05-04 DIAGNOSIS — M6281 Muscle weakness (generalized): Secondary | ICD-10-CM | POA: Diagnosis not present

## 2018-05-05 DIAGNOSIS — S062X0A Diffuse traumatic brain injury without loss of consciousness, initial encounter: Secondary | ICD-10-CM | POA: Diagnosis not present

## 2018-05-05 DIAGNOSIS — S72002A Fracture of unspecified part of neck of left femur, initial encounter for closed fracture: Secondary | ICD-10-CM | POA: Diagnosis not present

## 2018-05-05 DIAGNOSIS — E440 Moderate protein-calorie malnutrition: Secondary | ICD-10-CM | POA: Diagnosis not present

## 2018-05-05 DIAGNOSIS — M6281 Muscle weakness (generalized): Secondary | ICD-10-CM | POA: Diagnosis not present

## 2018-05-05 DIAGNOSIS — R1310 Dysphagia, unspecified: Secondary | ICD-10-CM | POA: Diagnosis not present

## 2018-05-05 DIAGNOSIS — R293 Abnormal posture: Secondary | ICD-10-CM | POA: Diagnosis not present

## 2018-05-07 DIAGNOSIS — S72002A Fracture of unspecified part of neck of left femur, initial encounter for closed fracture: Secondary | ICD-10-CM | POA: Diagnosis not present

## 2018-05-07 DIAGNOSIS — M6281 Muscle weakness (generalized): Secondary | ICD-10-CM | POA: Diagnosis not present

## 2018-05-07 DIAGNOSIS — R293 Abnormal posture: Secondary | ICD-10-CM | POA: Diagnosis not present

## 2018-05-07 DIAGNOSIS — E440 Moderate protein-calorie malnutrition: Secondary | ICD-10-CM | POA: Diagnosis not present

## 2018-05-07 DIAGNOSIS — R1310 Dysphagia, unspecified: Secondary | ICD-10-CM | POA: Diagnosis not present

## 2018-05-07 DIAGNOSIS — S062X0A Diffuse traumatic brain injury without loss of consciousness, initial encounter: Secondary | ICD-10-CM | POA: Diagnosis not present

## 2018-05-08 DIAGNOSIS — S72002A Fracture of unspecified part of neck of left femur, initial encounter for closed fracture: Secondary | ICD-10-CM | POA: Diagnosis not present

## 2018-05-08 DIAGNOSIS — M6281 Muscle weakness (generalized): Secondary | ICD-10-CM | POA: Diagnosis not present

## 2018-05-08 DIAGNOSIS — R293 Abnormal posture: Secondary | ICD-10-CM | POA: Diagnosis not present

## 2018-05-08 DIAGNOSIS — S062X0A Diffuse traumatic brain injury without loss of consciousness, initial encounter: Secondary | ICD-10-CM | POA: Diagnosis not present

## 2018-05-08 DIAGNOSIS — E440 Moderate protein-calorie malnutrition: Secondary | ICD-10-CM | POA: Diagnosis not present

## 2018-05-08 DIAGNOSIS — R1310 Dysphagia, unspecified: Secondary | ICD-10-CM | POA: Diagnosis not present

## 2018-05-09 DIAGNOSIS — S062X0A Diffuse traumatic brain injury without loss of consciousness, initial encounter: Secondary | ICD-10-CM | POA: Diagnosis not present

## 2018-05-09 DIAGNOSIS — S72002A Fracture of unspecified part of neck of left femur, initial encounter for closed fracture: Secondary | ICD-10-CM | POA: Diagnosis not present

## 2018-05-09 DIAGNOSIS — E440 Moderate protein-calorie malnutrition: Secondary | ICD-10-CM | POA: Diagnosis not present

## 2018-05-09 DIAGNOSIS — R293 Abnormal posture: Secondary | ICD-10-CM | POA: Diagnosis not present

## 2018-05-09 DIAGNOSIS — M6281 Muscle weakness (generalized): Secondary | ICD-10-CM | POA: Diagnosis not present

## 2018-05-09 DIAGNOSIS — R69 Illness, unspecified: Secondary | ICD-10-CM | POA: Diagnosis not present

## 2018-05-09 DIAGNOSIS — R1310 Dysphagia, unspecified: Secondary | ICD-10-CM | POA: Diagnosis not present

## 2018-05-16 DIAGNOSIS — R69 Illness, unspecified: Secondary | ICD-10-CM | POA: Diagnosis not present

## 2018-05-23 DIAGNOSIS — R69 Illness, unspecified: Secondary | ICD-10-CM | POA: Diagnosis not present

## 2018-05-29 DIAGNOSIS — E119 Type 2 diabetes mellitus without complications: Secondary | ICD-10-CM | POA: Diagnosis not present

## 2018-05-29 DIAGNOSIS — R195 Other fecal abnormalities: Secondary | ICD-10-CM | POA: Diagnosis not present

## 2018-05-29 DIAGNOSIS — E782 Mixed hyperlipidemia: Secondary | ICD-10-CM | POA: Diagnosis not present

## 2018-05-29 DIAGNOSIS — I10 Essential (primary) hypertension: Secondary | ICD-10-CM | POA: Diagnosis not present

## 2018-05-31 DIAGNOSIS — I10 Essential (primary) hypertension: Secondary | ICD-10-CM | POA: Diagnosis not present

## 2018-05-31 DIAGNOSIS — R69 Illness, unspecified: Secondary | ICD-10-CM | POA: Diagnosis not present

## 2018-05-31 DIAGNOSIS — E119 Type 2 diabetes mellitus without complications: Secondary | ICD-10-CM | POA: Diagnosis not present

## 2018-05-31 DIAGNOSIS — K219 Gastro-esophageal reflux disease without esophagitis: Secondary | ICD-10-CM | POA: Diagnosis not present

## 2018-06-05 DIAGNOSIS — R69 Illness, unspecified: Secondary | ICD-10-CM | POA: Diagnosis not present

## 2018-06-20 DIAGNOSIS — R69 Illness, unspecified: Secondary | ICD-10-CM | POA: Diagnosis not present

## 2018-06-22 DIAGNOSIS — R69 Illness, unspecified: Secondary | ICD-10-CM | POA: Diagnosis not present

## 2018-06-26 DIAGNOSIS — K219 Gastro-esophageal reflux disease without esophagitis: Secondary | ICD-10-CM | POA: Diagnosis not present

## 2018-06-26 DIAGNOSIS — E119 Type 2 diabetes mellitus without complications: Secondary | ICD-10-CM | POA: Diagnosis not present

## 2018-06-26 DIAGNOSIS — R5381 Other malaise: Secondary | ICD-10-CM | POA: Diagnosis not present

## 2018-06-26 DIAGNOSIS — I10 Essential (primary) hypertension: Secondary | ICD-10-CM | POA: Diagnosis not present

## 2018-06-27 DIAGNOSIS — D649 Anemia, unspecified: Secondary | ICD-10-CM | POA: Diagnosis not present

## 2018-06-27 DIAGNOSIS — E118 Type 2 diabetes mellitus with unspecified complications: Secondary | ICD-10-CM | POA: Diagnosis not present

## 2018-06-27 DIAGNOSIS — E785 Hyperlipidemia, unspecified: Secondary | ICD-10-CM | POA: Diagnosis not present

## 2018-06-27 DIAGNOSIS — E039 Hypothyroidism, unspecified: Secondary | ICD-10-CM | POA: Diagnosis not present

## 2018-06-27 DIAGNOSIS — R69 Illness, unspecified: Secondary | ICD-10-CM | POA: Diagnosis not present

## 2018-06-28 DIAGNOSIS — K219 Gastro-esophageal reflux disease without esophagitis: Secondary | ICD-10-CM | POA: Diagnosis not present

## 2018-06-28 DIAGNOSIS — E119 Type 2 diabetes mellitus without complications: Secondary | ICD-10-CM | POA: Diagnosis not present

## 2018-06-28 DIAGNOSIS — I10 Essential (primary) hypertension: Secondary | ICD-10-CM | POA: Diagnosis not present

## 2018-07-03 ENCOUNTER — Telehealth: Payer: Self-pay | Admitting: General Practice

## 2018-07-03 NOTE — Telephone Encounter (Signed)
Tried to call this phone number that was in [facility]'s old computer system [employer].  No answer.

## 2018-07-03 NOTE — Telephone Encounter (Signed)
Called to schedule Medicare Annual Wellness Visit with the Nurse Health Advisor. No answer.  If patient returns call, please note: their last AWV was on 06/09/2017 please schedule AWV with NHA any date AFTER 06/09/2018.  NEEDS OFFICE VISIT TOO.  Thank you! For any questions please contact: Trixie Rude at [phone] or Skype [email or web address]

## 2018-07-04 DIAGNOSIS — R69 Illness, unspecified: Secondary | ICD-10-CM | POA: Diagnosis not present

## 2018-07-11 DIAGNOSIS — R69 Illness, unspecified: Secondary | ICD-10-CM | POA: Diagnosis not present

## 2018-07-17 DIAGNOSIS — R69 Illness, unspecified: Secondary | ICD-10-CM | POA: Diagnosis not present

## 2018-07-24 DIAGNOSIS — R69 Illness, unspecified: Secondary | ICD-10-CM | POA: Diagnosis not present

## 2018-07-24 DIAGNOSIS — K219 Gastro-esophageal reflux disease without esophagitis: Secondary | ICD-10-CM | POA: Diagnosis not present

## 2018-07-24 DIAGNOSIS — R5381 Other malaise: Secondary | ICD-10-CM | POA: Diagnosis not present

## 2018-07-24 DIAGNOSIS — E782 Mixed hyperlipidemia: Secondary | ICD-10-CM | POA: Diagnosis not present

## 2018-07-24 DIAGNOSIS — E119 Type 2 diabetes mellitus without complications: Secondary | ICD-10-CM | POA: Diagnosis not present

## 2018-07-26 DIAGNOSIS — K219 Gastro-esophageal reflux disease without esophagitis: Secondary | ICD-10-CM | POA: Diagnosis not present

## 2018-07-26 DIAGNOSIS — E119 Type 2 diabetes mellitus without complications: Secondary | ICD-10-CM | POA: Diagnosis not present

## 2018-07-26 DIAGNOSIS — I10 Essential (primary) hypertension: Secondary | ICD-10-CM | POA: Diagnosis not present

## 2018-08-01 DIAGNOSIS — R69 Illness, unspecified: Secondary | ICD-10-CM | POA: Diagnosis not present

## 2018-08-08 DIAGNOSIS — R69 Illness, unspecified: Secondary | ICD-10-CM | POA: Diagnosis not present

## 2018-08-15 DIAGNOSIS — R69 Illness, unspecified: Secondary | ICD-10-CM | POA: Diagnosis not present

## 2018-08-17 DIAGNOSIS — G14 Postpolio syndrome: Secondary | ICD-10-CM | POA: Diagnosis not present

## 2018-08-17 DIAGNOSIS — R293 Abnormal posture: Secondary | ICD-10-CM | POA: Diagnosis not present

## 2018-08-17 DIAGNOSIS — M6281 Muscle weakness (generalized): Secondary | ICD-10-CM | POA: Diagnosis not present

## 2018-08-21 DIAGNOSIS — G14 Postpolio syndrome: Secondary | ICD-10-CM | POA: Diagnosis not present

## 2018-08-21 DIAGNOSIS — E119 Type 2 diabetes mellitus without complications: Secondary | ICD-10-CM | POA: Diagnosis not present

## 2018-08-21 DIAGNOSIS — E782 Mixed hyperlipidemia: Secondary | ICD-10-CM | POA: Diagnosis not present

## 2018-08-21 DIAGNOSIS — K219 Gastro-esophageal reflux disease without esophagitis: Secondary | ICD-10-CM | POA: Diagnosis not present

## 2018-08-21 NOTE — Telephone Encounter (Signed)
Called to schedule Medicare Annual Wellness Visit with the Nurse Health Advisor. I called the number in Epic and the number in [employer]([facility]'s old computer system) [phone].  No answer at both numbers and no machine to leave a message. If patient returns call, please note: their last AWV was on 06/09/2017, please schedule AWV with NHA AND please schedule an Office Visit with the provider.  Thank you! For any questions please contact: Trixie Rude at [phone] or Skype [email or web address]

## 2018-08-22 DIAGNOSIS — R69 Illness, unspecified: Secondary | ICD-10-CM | POA: Diagnosis not present

## 2018-08-23 DIAGNOSIS — E119 Type 2 diabetes mellitus without complications: Secondary | ICD-10-CM | POA: Diagnosis not present

## 2018-08-23 DIAGNOSIS — E782 Mixed hyperlipidemia: Secondary | ICD-10-CM | POA: Diagnosis not present

## 2018-08-23 DIAGNOSIS — I10 Essential (primary) hypertension: Secondary | ICD-10-CM | POA: Diagnosis not present

## 2018-08-23 DIAGNOSIS — R69 Illness, unspecified: Secondary | ICD-10-CM | POA: Diagnosis not present

## 2018-08-23 DIAGNOSIS — K219 Gastro-esophageal reflux disease without esophagitis: Secondary | ICD-10-CM | POA: Diagnosis not present

## 2018-09-05 DIAGNOSIS — M6281 Muscle weakness (generalized): Secondary | ICD-10-CM | POA: Diagnosis not present

## 2018-09-05 DIAGNOSIS — G14 Postpolio syndrome: Secondary | ICD-10-CM | POA: Diagnosis not present

## 2018-09-05 DIAGNOSIS — I639 Cerebral infarction, unspecified: Secondary | ICD-10-CM | POA: Diagnosis not present

## 2018-09-05 DIAGNOSIS — R293 Abnormal posture: Secondary | ICD-10-CM | POA: Diagnosis not present

## 2018-09-05 DIAGNOSIS — Z8673 Personal history of transient ischemic attack (TIA), and cerebral infarction without residual deficits: Secondary | ICD-10-CM | POA: Diagnosis not present

## 2018-09-06 DIAGNOSIS — Z8673 Personal history of transient ischemic attack (TIA), and cerebral infarction without residual deficits: Secondary | ICD-10-CM | POA: Diagnosis not present

## 2018-09-06 DIAGNOSIS — G14 Postpolio syndrome: Secondary | ICD-10-CM | POA: Diagnosis not present

## 2018-09-06 DIAGNOSIS — R293 Abnormal posture: Secondary | ICD-10-CM | POA: Diagnosis not present

## 2018-09-06 DIAGNOSIS — I639 Cerebral infarction, unspecified: Secondary | ICD-10-CM | POA: Diagnosis not present

## 2018-09-06 DIAGNOSIS — M6281 Muscle weakness (generalized): Secondary | ICD-10-CM | POA: Diagnosis not present

## 2018-09-07 DIAGNOSIS — I639 Cerebral infarction, unspecified: Secondary | ICD-10-CM | POA: Diagnosis not present

## 2018-09-07 DIAGNOSIS — M6281 Muscle weakness (generalized): Secondary | ICD-10-CM | POA: Diagnosis not present

## 2018-09-07 DIAGNOSIS — R293 Abnormal posture: Secondary | ICD-10-CM | POA: Diagnosis not present

## 2018-09-07 DIAGNOSIS — G14 Postpolio syndrome: Secondary | ICD-10-CM | POA: Diagnosis not present

## 2018-09-07 DIAGNOSIS — Z8673 Personal history of transient ischemic attack (TIA), and cerebral infarction without residual deficits: Secondary | ICD-10-CM | POA: Diagnosis not present

## 2018-09-10 DIAGNOSIS — Z8673 Personal history of transient ischemic attack (TIA), and cerebral infarction without residual deficits: Secondary | ICD-10-CM | POA: Diagnosis not present

## 2018-09-10 DIAGNOSIS — G14 Postpolio syndrome: Secondary | ICD-10-CM | POA: Diagnosis not present

## 2018-09-10 DIAGNOSIS — M6281 Muscle weakness (generalized): Secondary | ICD-10-CM | POA: Diagnosis not present

## 2018-09-10 DIAGNOSIS — R293 Abnormal posture: Secondary | ICD-10-CM | POA: Diagnosis not present

## 2018-09-10 DIAGNOSIS — I639 Cerebral infarction, unspecified: Secondary | ICD-10-CM | POA: Diagnosis not present

## 2018-09-11 DIAGNOSIS — G14 Postpolio syndrome: Secondary | ICD-10-CM | POA: Diagnosis not present

## 2018-09-11 DIAGNOSIS — I639 Cerebral infarction, unspecified: Secondary | ICD-10-CM | POA: Diagnosis not present

## 2018-09-11 DIAGNOSIS — R293 Abnormal posture: Secondary | ICD-10-CM | POA: Diagnosis not present

## 2018-09-11 DIAGNOSIS — M6281 Muscle weakness (generalized): Secondary | ICD-10-CM | POA: Diagnosis not present

## 2018-09-11 DIAGNOSIS — Z8673 Personal history of transient ischemic attack (TIA), and cerebral infarction without residual deficits: Secondary | ICD-10-CM | POA: Diagnosis not present

## 2018-09-12 DIAGNOSIS — Z8673 Personal history of transient ischemic attack (TIA), and cerebral infarction without residual deficits: Secondary | ICD-10-CM | POA: Diagnosis not present

## 2018-09-12 DIAGNOSIS — G14 Postpolio syndrome: Secondary | ICD-10-CM | POA: Diagnosis not present

## 2018-09-12 DIAGNOSIS — M6281 Muscle weakness (generalized): Secondary | ICD-10-CM | POA: Diagnosis not present

## 2018-09-12 DIAGNOSIS — R293 Abnormal posture: Secondary | ICD-10-CM | POA: Diagnosis not present

## 2018-09-12 DIAGNOSIS — I639 Cerebral infarction, unspecified: Secondary | ICD-10-CM | POA: Diagnosis not present

## 2018-09-13 DIAGNOSIS — I639 Cerebral infarction, unspecified: Secondary | ICD-10-CM | POA: Diagnosis not present

## 2018-09-13 DIAGNOSIS — R293 Abnormal posture: Secondary | ICD-10-CM | POA: Diagnosis not present

## 2018-09-13 DIAGNOSIS — M6281 Muscle weakness (generalized): Secondary | ICD-10-CM | POA: Diagnosis not present

## 2018-09-13 DIAGNOSIS — G14 Postpolio syndrome: Secondary | ICD-10-CM | POA: Diagnosis not present

## 2018-09-13 DIAGNOSIS — Z8673 Personal history of transient ischemic attack (TIA), and cerebral infarction without residual deficits: Secondary | ICD-10-CM | POA: Diagnosis not present

## 2018-10-01 DIAGNOSIS — E119 Type 2 diabetes mellitus without complications: Secondary | ICD-10-CM | POA: Diagnosis not present

## 2018-10-01 DIAGNOSIS — K219 Gastro-esophageal reflux disease without esophagitis: Secondary | ICD-10-CM | POA: Diagnosis not present

## 2018-10-01 DIAGNOSIS — I10 Essential (primary) hypertension: Secondary | ICD-10-CM | POA: Diagnosis not present

## 2018-10-01 DIAGNOSIS — G14 Postpolio syndrome: Secondary | ICD-10-CM | POA: Diagnosis not present

## 2018-10-24 DIAGNOSIS — E785 Hyperlipidemia, unspecified: Secondary | ICD-10-CM | POA: Diagnosis not present

## 2018-10-24 DIAGNOSIS — G14 Postpolio syndrome: Secondary | ICD-10-CM | POA: Diagnosis not present

## 2018-10-24 DIAGNOSIS — I10 Essential (primary) hypertension: Secondary | ICD-10-CM | POA: Diagnosis not present

## 2018-11-28 DIAGNOSIS — R69 Illness, unspecified: Secondary | ICD-10-CM | POA: Diagnosis not present

## 2018-12-03 DIAGNOSIS — E119 Type 2 diabetes mellitus without complications: Secondary | ICD-10-CM | POA: Diagnosis not present

## 2018-12-03 DIAGNOSIS — I10 Essential (primary) hypertension: Secondary | ICD-10-CM | POA: Diagnosis not present

## 2018-12-03 DIAGNOSIS — K219 Gastro-esophageal reflux disease without esophagitis: Secondary | ICD-10-CM | POA: Diagnosis not present

## 2018-12-03 DIAGNOSIS — G14 Postpolio syndrome: Secondary | ICD-10-CM | POA: Diagnosis not present

## 2018-12-10 DIAGNOSIS — E039 Hypothyroidism, unspecified: Secondary | ICD-10-CM | POA: Diagnosis not present

## 2018-12-10 DIAGNOSIS — E118 Type 2 diabetes mellitus with unspecified complications: Secondary | ICD-10-CM | POA: Diagnosis not present

## 2018-12-10 DIAGNOSIS — E785 Hyperlipidemia, unspecified: Secondary | ICD-10-CM | POA: Diagnosis not present

## 2018-12-10 DIAGNOSIS — D649 Anemia, unspecified: Secondary | ICD-10-CM | POA: Diagnosis not present

## 2018-12-15 DIAGNOSIS — M24573 Contracture, unspecified ankle: Secondary | ICD-10-CM | POA: Diagnosis not present

## 2018-12-15 DIAGNOSIS — R29898 Other symptoms and signs involving the musculoskeletal system: Secondary | ICD-10-CM | POA: Diagnosis not present

## 2018-12-15 DIAGNOSIS — E119 Type 2 diabetes mellitus without complications: Secondary | ICD-10-CM | POA: Diagnosis not present

## 2018-12-15 DIAGNOSIS — G14 Postpolio syndrome: Secondary | ICD-10-CM | POA: Diagnosis not present

## 2018-12-17 DIAGNOSIS — E119 Type 2 diabetes mellitus without complications: Secondary | ICD-10-CM | POA: Diagnosis not present

## 2018-12-17 DIAGNOSIS — I10 Essential (primary) hypertension: Secondary | ICD-10-CM | POA: Diagnosis not present

## 2018-12-17 DIAGNOSIS — E785 Hyperlipidemia, unspecified: Secondary | ICD-10-CM | POA: Diagnosis not present

## 2018-12-17 DIAGNOSIS — K219 Gastro-esophageal reflux disease without esophagitis: Secondary | ICD-10-CM | POA: Diagnosis not present

## 2018-12-18 DIAGNOSIS — G14 Postpolio syndrome: Secondary | ICD-10-CM | POA: Diagnosis not present

## 2018-12-18 DIAGNOSIS — M24573 Contracture, unspecified ankle: Secondary | ICD-10-CM | POA: Diagnosis not present

## 2018-12-18 DIAGNOSIS — E119 Type 2 diabetes mellitus without complications: Secondary | ICD-10-CM | POA: Diagnosis not present

## 2018-12-18 DIAGNOSIS — R29898 Other symptoms and signs involving the musculoskeletal system: Secondary | ICD-10-CM | POA: Diagnosis not present

## 2018-12-19 DIAGNOSIS — M24573 Contracture, unspecified ankle: Secondary | ICD-10-CM | POA: Diagnosis not present

## 2018-12-19 DIAGNOSIS — E119 Type 2 diabetes mellitus without complications: Secondary | ICD-10-CM | POA: Diagnosis not present

## 2018-12-19 DIAGNOSIS — R29898 Other symptoms and signs involving the musculoskeletal system: Secondary | ICD-10-CM | POA: Diagnosis not present

## 2018-12-19 DIAGNOSIS — G14 Postpolio syndrome: Secondary | ICD-10-CM | POA: Diagnosis not present

## 2018-12-20 DIAGNOSIS — G14 Postpolio syndrome: Secondary | ICD-10-CM | POA: Diagnosis not present

## 2018-12-20 DIAGNOSIS — R29898 Other symptoms and signs involving the musculoskeletal system: Secondary | ICD-10-CM | POA: Diagnosis not present

## 2018-12-20 DIAGNOSIS — M24573 Contracture, unspecified ankle: Secondary | ICD-10-CM | POA: Diagnosis not present

## 2018-12-20 DIAGNOSIS — E119 Type 2 diabetes mellitus without complications: Secondary | ICD-10-CM | POA: Diagnosis not present

## 2018-12-21 DIAGNOSIS — G14 Postpolio syndrome: Secondary | ICD-10-CM | POA: Diagnosis not present

## 2018-12-21 DIAGNOSIS — M24573 Contracture, unspecified ankle: Secondary | ICD-10-CM | POA: Diagnosis not present

## 2018-12-21 DIAGNOSIS — E119 Type 2 diabetes mellitus without complications: Secondary | ICD-10-CM | POA: Diagnosis not present

## 2018-12-21 DIAGNOSIS — R29898 Other symptoms and signs involving the musculoskeletal system: Secondary | ICD-10-CM | POA: Diagnosis not present

## 2018-12-24 DIAGNOSIS — G14 Postpolio syndrome: Secondary | ICD-10-CM | POA: Diagnosis not present

## 2018-12-24 DIAGNOSIS — R29898 Other symptoms and signs involving the musculoskeletal system: Secondary | ICD-10-CM | POA: Diagnosis not present

## 2018-12-24 DIAGNOSIS — M24573 Contracture, unspecified ankle: Secondary | ICD-10-CM | POA: Diagnosis not present

## 2018-12-24 DIAGNOSIS — E119 Type 2 diabetes mellitus without complications: Secondary | ICD-10-CM | POA: Diagnosis not present

## 2018-12-25 DIAGNOSIS — G14 Postpolio syndrome: Secondary | ICD-10-CM | POA: Diagnosis not present

## 2018-12-25 DIAGNOSIS — M24573 Contracture, unspecified ankle: Secondary | ICD-10-CM | POA: Diagnosis not present

## 2018-12-25 DIAGNOSIS — E119 Type 2 diabetes mellitus without complications: Secondary | ICD-10-CM | POA: Diagnosis not present

## 2018-12-25 DIAGNOSIS — R29898 Other symptoms and signs involving the musculoskeletal system: Secondary | ICD-10-CM | POA: Diagnosis not present

## 2018-12-26 DIAGNOSIS — E119 Type 2 diabetes mellitus without complications: Secondary | ICD-10-CM | POA: Diagnosis not present

## 2018-12-26 DIAGNOSIS — M24573 Contracture, unspecified ankle: Secondary | ICD-10-CM | POA: Diagnosis not present

## 2018-12-26 DIAGNOSIS — G14 Postpolio syndrome: Secondary | ICD-10-CM | POA: Diagnosis not present

## 2018-12-26 DIAGNOSIS — R29898 Other symptoms and signs involving the musculoskeletal system: Secondary | ICD-10-CM | POA: Diagnosis not present

## 2018-12-27 DIAGNOSIS — M24573 Contracture, unspecified ankle: Secondary | ICD-10-CM | POA: Diagnosis not present

## 2018-12-27 DIAGNOSIS — E119 Type 2 diabetes mellitus without complications: Secondary | ICD-10-CM | POA: Diagnosis not present

## 2018-12-27 DIAGNOSIS — R29898 Other symptoms and signs involving the musculoskeletal system: Secondary | ICD-10-CM | POA: Diagnosis not present

## 2018-12-27 DIAGNOSIS — G14 Postpolio syndrome: Secondary | ICD-10-CM | POA: Diagnosis not present

## 2018-12-28 DIAGNOSIS — G14 Postpolio syndrome: Secondary | ICD-10-CM | POA: Diagnosis not present

## 2018-12-28 DIAGNOSIS — M24573 Contracture, unspecified ankle: Secondary | ICD-10-CM | POA: Diagnosis not present

## 2018-12-28 DIAGNOSIS — R29898 Other symptoms and signs involving the musculoskeletal system: Secondary | ICD-10-CM | POA: Diagnosis not present

## 2018-12-28 DIAGNOSIS — E119 Type 2 diabetes mellitus without complications: Secondary | ICD-10-CM | POA: Diagnosis not present

## 2018-12-31 DIAGNOSIS — M24573 Contracture, unspecified ankle: Secondary | ICD-10-CM | POA: Diagnosis not present

## 2018-12-31 DIAGNOSIS — E119 Type 2 diabetes mellitus without complications: Secondary | ICD-10-CM | POA: Diagnosis not present

## 2018-12-31 DIAGNOSIS — G14 Postpolio syndrome: Secondary | ICD-10-CM | POA: Diagnosis not present

## 2018-12-31 DIAGNOSIS — R29898 Other symptoms and signs involving the musculoskeletal system: Secondary | ICD-10-CM | POA: Diagnosis not present

## 2019-01-01 DIAGNOSIS — R29898 Other symptoms and signs involving the musculoskeletal system: Secondary | ICD-10-CM | POA: Diagnosis not present

## 2019-01-01 DIAGNOSIS — M24573 Contracture, unspecified ankle: Secondary | ICD-10-CM | POA: Diagnosis not present

## 2019-01-01 DIAGNOSIS — E119 Type 2 diabetes mellitus without complications: Secondary | ICD-10-CM | POA: Diagnosis not present

## 2019-01-01 DIAGNOSIS — G14 Postpolio syndrome: Secondary | ICD-10-CM | POA: Diagnosis not present

## 2019-01-02 DIAGNOSIS — R29898 Other symptoms and signs involving the musculoskeletal system: Secondary | ICD-10-CM | POA: Diagnosis not present

## 2019-01-02 DIAGNOSIS — E119 Type 2 diabetes mellitus without complications: Secondary | ICD-10-CM | POA: Diagnosis not present

## 2019-01-02 DIAGNOSIS — G14 Postpolio syndrome: Secondary | ICD-10-CM | POA: Diagnosis not present

## 2019-01-02 DIAGNOSIS — M24573 Contracture, unspecified ankle: Secondary | ICD-10-CM | POA: Diagnosis not present

## 2019-01-03 DIAGNOSIS — R29898 Other symptoms and signs involving the musculoskeletal system: Secondary | ICD-10-CM | POA: Diagnosis not present

## 2019-01-03 DIAGNOSIS — M24573 Contracture, unspecified ankle: Secondary | ICD-10-CM | POA: Diagnosis not present

## 2019-01-03 DIAGNOSIS — G14 Postpolio syndrome: Secondary | ICD-10-CM | POA: Diagnosis not present

## 2019-01-03 DIAGNOSIS — E119 Type 2 diabetes mellitus without complications: Secondary | ICD-10-CM | POA: Diagnosis not present

## 2019-01-04 DIAGNOSIS — G14 Postpolio syndrome: Secondary | ICD-10-CM | POA: Diagnosis not present

## 2019-01-04 DIAGNOSIS — E119 Type 2 diabetes mellitus without complications: Secondary | ICD-10-CM | POA: Diagnosis not present

## 2019-01-04 DIAGNOSIS — Z20828 Contact with and (suspected) exposure to other viral communicable diseases: Secondary | ICD-10-CM | POA: Diagnosis not present

## 2019-01-04 DIAGNOSIS — R29898 Other symptoms and signs involving the musculoskeletal system: Secondary | ICD-10-CM | POA: Diagnosis not present

## 2019-01-04 DIAGNOSIS — M24573 Contracture, unspecified ankle: Secondary | ICD-10-CM | POA: Diagnosis not present

## 2019-01-08 DIAGNOSIS — M24573 Contracture, unspecified ankle: Secondary | ICD-10-CM | POA: Diagnosis not present

## 2019-01-08 DIAGNOSIS — E119 Type 2 diabetes mellitus without complications: Secondary | ICD-10-CM | POA: Diagnosis not present

## 2019-01-08 DIAGNOSIS — R29898 Other symptoms and signs involving the musculoskeletal system: Secondary | ICD-10-CM | POA: Diagnosis not present

## 2019-01-08 DIAGNOSIS — G14 Postpolio syndrome: Secondary | ICD-10-CM | POA: Diagnosis not present

## 2019-01-10 DIAGNOSIS — M24573 Contracture, unspecified ankle: Secondary | ICD-10-CM | POA: Diagnosis not present

## 2019-01-10 DIAGNOSIS — E119 Type 2 diabetes mellitus without complications: Secondary | ICD-10-CM | POA: Diagnosis not present

## 2019-01-10 DIAGNOSIS — G14 Postpolio syndrome: Secondary | ICD-10-CM | POA: Diagnosis not present

## 2019-01-10 DIAGNOSIS — R29898 Other symptoms and signs involving the musculoskeletal system: Secondary | ICD-10-CM | POA: Diagnosis not present

## 2019-01-11 DIAGNOSIS — E119 Type 2 diabetes mellitus without complications: Secondary | ICD-10-CM | POA: Diagnosis not present

## 2019-01-11 DIAGNOSIS — R29898 Other symptoms and signs involving the musculoskeletal system: Secondary | ICD-10-CM | POA: Diagnosis not present

## 2019-01-11 DIAGNOSIS — G14 Postpolio syndrome: Secondary | ICD-10-CM | POA: Diagnosis not present

## 2019-01-11 DIAGNOSIS — M24573 Contracture, unspecified ankle: Secondary | ICD-10-CM | POA: Diagnosis not present

## 2019-01-14 DIAGNOSIS — R29898 Other symptoms and signs involving the musculoskeletal system: Secondary | ICD-10-CM | POA: Diagnosis not present

## 2019-01-14 DIAGNOSIS — G14 Postpolio syndrome: Secondary | ICD-10-CM | POA: Diagnosis not present

## 2019-01-14 DIAGNOSIS — E119 Type 2 diabetes mellitus without complications: Secondary | ICD-10-CM | POA: Diagnosis not present

## 2019-01-14 DIAGNOSIS — M24573 Contracture, unspecified ankle: Secondary | ICD-10-CM | POA: Diagnosis not present

## 2019-01-15 DIAGNOSIS — E119 Type 2 diabetes mellitus without complications: Secondary | ICD-10-CM | POA: Diagnosis not present

## 2019-01-15 DIAGNOSIS — G14 Postpolio syndrome: Secondary | ICD-10-CM | POA: Diagnosis not present

## 2019-01-15 DIAGNOSIS — R29898 Other symptoms and signs involving the musculoskeletal system: Secondary | ICD-10-CM | POA: Diagnosis not present

## 2019-01-15 DIAGNOSIS — M24573 Contracture, unspecified ankle: Secondary | ICD-10-CM | POA: Diagnosis not present

## 2019-01-16 DIAGNOSIS — M24573 Contracture, unspecified ankle: Secondary | ICD-10-CM | POA: Diagnosis not present

## 2019-01-16 DIAGNOSIS — R29898 Other symptoms and signs involving the musculoskeletal system: Secondary | ICD-10-CM | POA: Diagnosis not present

## 2019-01-16 DIAGNOSIS — E119 Type 2 diabetes mellitus without complications: Secondary | ICD-10-CM | POA: Diagnosis not present

## 2019-01-16 DIAGNOSIS — G14 Postpolio syndrome: Secondary | ICD-10-CM | POA: Diagnosis not present

## 2019-01-18 DIAGNOSIS — R29898 Other symptoms and signs involving the musculoskeletal system: Secondary | ICD-10-CM | POA: Diagnosis not present

## 2019-01-18 DIAGNOSIS — M24573 Contracture, unspecified ankle: Secondary | ICD-10-CM | POA: Diagnosis not present

## 2019-01-18 DIAGNOSIS — G14 Postpolio syndrome: Secondary | ICD-10-CM | POA: Diagnosis not present

## 2019-01-18 DIAGNOSIS — E119 Type 2 diabetes mellitus without complications: Secondary | ICD-10-CM | POA: Diagnosis not present

## 2019-01-21 DIAGNOSIS — G14 Postpolio syndrome: Secondary | ICD-10-CM | POA: Diagnosis not present

## 2019-01-21 DIAGNOSIS — R29898 Other symptoms and signs involving the musculoskeletal system: Secondary | ICD-10-CM | POA: Diagnosis not present

## 2019-01-21 DIAGNOSIS — M24573 Contracture, unspecified ankle: Secondary | ICD-10-CM | POA: Diagnosis not present

## 2019-01-21 DIAGNOSIS — E119 Type 2 diabetes mellitus without complications: Secondary | ICD-10-CM | POA: Diagnosis not present

## 2019-01-22 DIAGNOSIS — G14 Postpolio syndrome: Secondary | ICD-10-CM | POA: Diagnosis not present

## 2019-01-22 DIAGNOSIS — E119 Type 2 diabetes mellitus without complications: Secondary | ICD-10-CM | POA: Diagnosis not present

## 2019-01-22 DIAGNOSIS — M24573 Contracture, unspecified ankle: Secondary | ICD-10-CM | POA: Diagnosis not present

## 2019-01-22 DIAGNOSIS — R29898 Other symptoms and signs involving the musculoskeletal system: Secondary | ICD-10-CM | POA: Diagnosis not present

## 2019-01-25 DIAGNOSIS — E119 Type 2 diabetes mellitus without complications: Secondary | ICD-10-CM | POA: Diagnosis not present

## 2019-01-25 DIAGNOSIS — R29898 Other symptoms and signs involving the musculoskeletal system: Secondary | ICD-10-CM | POA: Diagnosis not present

## 2019-01-25 DIAGNOSIS — G14 Postpolio syndrome: Secondary | ICD-10-CM | POA: Diagnosis not present

## 2019-01-25 DIAGNOSIS — M24573 Contracture, unspecified ankle: Secondary | ICD-10-CM | POA: Diagnosis not present

## 2019-01-25 DIAGNOSIS — Z20828 Contact with and (suspected) exposure to other viral communicable diseases: Secondary | ICD-10-CM | POA: Diagnosis not present

## 2019-01-29 DIAGNOSIS — E119 Type 2 diabetes mellitus without complications: Secondary | ICD-10-CM | POA: Diagnosis not present

## 2019-01-29 DIAGNOSIS — G14 Postpolio syndrome: Secondary | ICD-10-CM | POA: Diagnosis not present

## 2019-01-29 DIAGNOSIS — M24573 Contracture, unspecified ankle: Secondary | ICD-10-CM | POA: Diagnosis not present

## 2019-01-29 DIAGNOSIS — R29898 Other symptoms and signs involving the musculoskeletal system: Secondary | ICD-10-CM | POA: Diagnosis not present

## 2019-01-30 DIAGNOSIS — M24573 Contracture, unspecified ankle: Secondary | ICD-10-CM | POA: Diagnosis not present

## 2019-01-30 DIAGNOSIS — G14 Postpolio syndrome: Secondary | ICD-10-CM | POA: Diagnosis not present

## 2019-01-30 DIAGNOSIS — R29898 Other symptoms and signs involving the musculoskeletal system: Secondary | ICD-10-CM | POA: Diagnosis not present

## 2019-01-30 DIAGNOSIS — E119 Type 2 diabetes mellitus without complications: Secondary | ICD-10-CM | POA: Diagnosis not present

## 2019-01-31 DIAGNOSIS — R29898 Other symptoms and signs involving the musculoskeletal system: Secondary | ICD-10-CM | POA: Diagnosis not present

## 2019-01-31 DIAGNOSIS — G14 Postpolio syndrome: Secondary | ICD-10-CM | POA: Diagnosis not present

## 2019-01-31 DIAGNOSIS — M24573 Contracture, unspecified ankle: Secondary | ICD-10-CM | POA: Diagnosis not present

## 2019-01-31 DIAGNOSIS — E119 Type 2 diabetes mellitus without complications: Secondary | ICD-10-CM | POA: Diagnosis not present

## 2019-02-01 DIAGNOSIS — G14 Postpolio syndrome: Secondary | ICD-10-CM | POA: Diagnosis not present

## 2019-02-01 DIAGNOSIS — I10 Essential (primary) hypertension: Secondary | ICD-10-CM | POA: Diagnosis not present

## 2019-02-01 DIAGNOSIS — I480 Paroxysmal atrial fibrillation: Secondary | ICD-10-CM | POA: Diagnosis not present

## 2019-02-01 DIAGNOSIS — K219 Gastro-esophageal reflux disease without esophagitis: Secondary | ICD-10-CM | POA: Diagnosis not present

## 2019-03-01 DIAGNOSIS — R109 Unspecified abdominal pain: Secondary | ICD-10-CM | POA: Diagnosis not present

## 2019-03-01 DIAGNOSIS — K219 Gastro-esophageal reflux disease without esophagitis: Secondary | ICD-10-CM | POA: Diagnosis not present

## 2019-03-01 DIAGNOSIS — R69 Illness, unspecified: Secondary | ICD-10-CM | POA: Diagnosis not present

## 2019-03-14 DIAGNOSIS — E0822 Diabetes mellitus due to underlying condition with diabetic chronic kidney disease: Secondary | ICD-10-CM | POA: Diagnosis not present

## 2019-03-14 DIAGNOSIS — E119 Type 2 diabetes mellitus without complications: Secondary | ICD-10-CM | POA: Diagnosis not present

## 2019-03-15 DIAGNOSIS — Z794 Long term (current) use of insulin: Secondary | ICD-10-CM | POA: Diagnosis not present

## 2019-03-15 DIAGNOSIS — E119 Type 2 diabetes mellitus without complications: Secondary | ICD-10-CM | POA: Diagnosis not present

## 2019-03-24 DIAGNOSIS — Z20828 Contact with and (suspected) exposure to other viral communicable diseases: Secondary | ICD-10-CM | POA: Diagnosis not present

## 2019-03-28 DIAGNOSIS — Z20828 Contact with and (suspected) exposure to other viral communicable diseases: Secondary | ICD-10-CM | POA: Diagnosis not present

## 2019-04-01 DIAGNOSIS — K219 Gastro-esophageal reflux disease without esophagitis: Secondary | ICD-10-CM | POA: Diagnosis not present

## 2019-04-01 DIAGNOSIS — E119 Type 2 diabetes mellitus without complications: Secondary | ICD-10-CM | POA: Diagnosis not present

## 2019-04-01 DIAGNOSIS — I10 Essential (primary) hypertension: Secondary | ICD-10-CM | POA: Diagnosis not present

## 2019-04-01 DIAGNOSIS — G14 Postpolio syndrome: Secondary | ICD-10-CM | POA: Diagnosis not present

## 2019-04-04 DIAGNOSIS — J189 Pneumonia, unspecified organism: Secondary | ICD-10-CM | POA: Diagnosis not present

## 2019-04-04 DIAGNOSIS — I517 Cardiomegaly: Secondary | ICD-10-CM | POA: Diagnosis not present

## 2019-04-04 DIAGNOSIS — J9811 Atelectasis: Secondary | ICD-10-CM | POA: Diagnosis not present

## 2019-04-04 DIAGNOSIS — Z20828 Contact with and (suspected) exposure to other viral communicable diseases: Secondary | ICD-10-CM | POA: Diagnosis not present

## 2019-04-05 DIAGNOSIS — N390 Urinary tract infection, site not specified: Secondary | ICD-10-CM | POA: Diagnosis not present

## 2019-04-05 DIAGNOSIS — D649 Anemia, unspecified: Secondary | ICD-10-CM | POA: Diagnosis not present

## 2019-04-05 DIAGNOSIS — E118 Type 2 diabetes mellitus with unspecified complications: Secondary | ICD-10-CM | POA: Diagnosis not present

## 2019-04-05 DIAGNOSIS — R05 Cough: Secondary | ICD-10-CM | POA: Diagnosis not present

## 2019-04-05 DIAGNOSIS — R319 Hematuria, unspecified: Secondary | ICD-10-CM | POA: Diagnosis not present

## 2019-04-05 DIAGNOSIS — Z03818 Encounter for observation for suspected exposure to other biological agents ruled out: Secondary | ICD-10-CM | POA: Diagnosis not present

## 2019-04-05 DIAGNOSIS — R739 Hyperglycemia, unspecified: Secondary | ICD-10-CM | POA: Diagnosis not present

## 2019-04-08 DIAGNOSIS — R739 Hyperglycemia, unspecified: Secondary | ICD-10-CM | POA: Diagnosis not present

## 2019-04-08 DIAGNOSIS — E118 Type 2 diabetes mellitus with unspecified complications: Secondary | ICD-10-CM | POA: Diagnosis not present

## 2019-04-08 DIAGNOSIS — R05 Cough: Secondary | ICD-10-CM | POA: Diagnosis not present

## 2019-04-08 DIAGNOSIS — R7309 Other abnormal glucose: Secondary | ICD-10-CM | POA: Diagnosis not present

## 2019-04-08 DIAGNOSIS — Z03818 Encounter for observation for suspected exposure to other biological agents ruled out: Secondary | ICD-10-CM | POA: Diagnosis not present

## 2019-04-11 DIAGNOSIS — Z20828 Contact with and (suspected) exposure to other viral communicable diseases: Secondary | ICD-10-CM | POA: Diagnosis not present

## 2019-04-12 DIAGNOSIS — Z79899 Other long term (current) drug therapy: Secondary | ICD-10-CM | POA: Diagnosis not present

## 2019-04-12 DIAGNOSIS — D649 Anemia, unspecified: Secondary | ICD-10-CM | POA: Diagnosis not present

## 2019-04-12 DIAGNOSIS — E118 Type 2 diabetes mellitus with unspecified complications: Secondary | ICD-10-CM | POA: Diagnosis not present

## 2019-04-15 ENCOUNTER — Emergency Department (HOSPITAL_COMMUNITY)
Admission: EM | Admit: 2019-04-15 | Discharge: 2019-05-07 | Disposition: E | Payer: Medicare HMO | Attending: Emergency Medicine | Admitting: Emergency Medicine

## 2019-04-15 DIAGNOSIS — Z7984 Long term (current) use of oral hypoglycemic drugs: Secondary | ICD-10-CM | POA: Insufficient documentation

## 2019-04-15 DIAGNOSIS — E119 Type 2 diabetes mellitus without complications: Secondary | ICD-10-CM | POA: Diagnosis not present

## 2019-04-15 DIAGNOSIS — Z79899 Other long term (current) drug therapy: Secondary | ICD-10-CM | POA: Diagnosis not present

## 2019-04-15 DIAGNOSIS — I10 Essential (primary) hypertension: Secondary | ICD-10-CM | POA: Diagnosis not present

## 2019-04-15 DIAGNOSIS — R4020 Unspecified coma: Secondary | ICD-10-CM | POA: Diagnosis not present

## 2019-04-15 DIAGNOSIS — R0902 Hypoxemia: Secondary | ICD-10-CM | POA: Diagnosis not present

## 2019-04-15 DIAGNOSIS — I499 Cardiac arrhythmia, unspecified: Secondary | ICD-10-CM | POA: Diagnosis not present

## 2019-04-15 DIAGNOSIS — R404 Transient alteration of awareness: Secondary | ICD-10-CM | POA: Diagnosis not present

## 2019-04-15 DIAGNOSIS — E1165 Type 2 diabetes mellitus with hyperglycemia: Secondary | ICD-10-CM | POA: Diagnosis not present

## 2019-04-15 DIAGNOSIS — I469 Cardiac arrest, cause unspecified: Secondary | ICD-10-CM | POA: Diagnosis not present

## 2019-04-15 DIAGNOSIS — Z87891 Personal history of nicotine dependence: Secondary | ICD-10-CM | POA: Diagnosis not present

## 2019-05-07 NOTE — Code Documentation (Addendum)
Arrives to trauma bay with cpr in progress: ems have given  9 doses epi , 2 G mag, 50 bi carb 450 amiodarone,  1LNS 100 lido Shocked x5.  Ems started CPR at 0649 found down by staff at [place] last seen at [place] by staff.  558cbg  8.0 ET tube placed by ems

## 2019-05-07 NOTE — ED Notes (Addendum)
Dr Wilson Singer and this Rn spoke with Timothy Gross's second cousin listed in chart. He will not be coming to the ER and he did not know if Timothy Gross has funeral home arrangements or not. He states that he took are of the Timothy Gross for the last 2 years before the Timothy [facility] went to [facility]. Timothy Gross is the Timothy Gross's second cousin and can be reached at [phone] and his address is [place], [place] [address]. Timothy Gross is not an ME case. Timothy Gross placed in bag, tags in placed. Ready to go to the morgue.

## 2019-05-07 NOTE — Code Documentation (Addendum)
Pulse check: PEA  CPR resumed Linton Rump in place

## 2019-05-07 NOTE — ED Provider Notes (Signed)
[facility] EMERGENCY DEPARTMENT Provider Note   CSN: [id number] Arrival date & time: 18-Apr-2019  9242     History   Chief Complaint Chief Complaint  Patient presents with  . Cardiac Arrest    HPI Timothy Gross is a 78 y.o. male.     HPI   78 year old male brought from nursing facility in cardiac arrest.  Reportedly last known normal at 5:30 AM this morning.  Found by staff at 6:30 AM unresponsive. No reported trauma.  On EMS arrival his initial rhythm was asystole.  Glucose in the 500s.  Resuscitation per ACLS was initiated.  Rhythm changed to ventricular fibrillation.  He was given 450 of amiodarone.  He was shocked multiple times.  Numerous rounds of epinephrine.  He was intubated.  EMS called requesting to administer bicarb and lidocaine which I approved.  He continued to stay in V. fib until just before arrival to the emergency room where he briefly had ROSC with a rate in the 20s.  By the time they got him into the resuscitation bay he was in PEA.  EMS reports that they initiated ACLS at 6:39 am.  He arrived to the emergency room just before 7:30 AM.  Neurologically he has a GCS of 3T.  Initial rhythm check showing PEA.  Bedside ultrasound showing cardiac activity with rate about 20 with a markedly diminished EF.  Past Medical History:  Diagnosis Date  . Cervical myelopathy (HCC)    Hx of  . Diabetes mellitus   . GERD (gastroesophageal reflux disease)   . H/O acute alcoholic hepatitis   . History of post-polio syndrome    right sided weakness  . History of UTI   . Hyperlipidemia   . Hypertension   . Iron deficiency anemia   . Right patella fracture   . Right sided weakness     Patient Active Problem List   Diagnosis Date Noted  . Atrial fibrillation with RVR (HCC) 08/04/2016  . Symptomatic cholelithiasis 09/09/2011  . Constipation 09/09/2011  . Hyponatremia 07/28/2011  . Elevated LFTs 07/28/2011  . Abdominal pain 07/28/2011  . Generalized  weakness 07/28/2011  . Dehydration 07/28/2011  . Hypertension   . History of UTI   . Iron deficiency anemia   . DM (diabetes mellitus), type 2, uncontrolled (HCC)   . Hyperlipidemia   . GERD (gastroesophageal reflux disease)   . History of post-polio syndrome     Past Surgical History:  Procedure Laterality Date  . CERVICAL FUSION  04/16/2003  . CHOLECYSTECTOMY  09/11/2011   Procedure: LAPAROSCOPIC CHOLECYSTECTOMY WITH INTRAOPERATIVE CHOLANGIOGRAM;  Surgeon: Atilano Ina, MD,FACS;  Location: [facility] OR;  Service: General;  Laterality: N/A;  . ESOPHAGOGASTRODUODENOSCOPY  07/28/2011   Procedure: ESOPHAGOGASTRODUODENOSCOPY (EGD);  Surgeon: Louis Meckel, MD;  Location: [facility];  Service: Endoscopy;  Laterality: N/A;  . ORIF Right Femur Fracture    . radioactive iodine treatment  1984        Home Medications    Prior to Admission medications   Medication Sig Start Date End Date Taking? Authorizing Provider  Aspirin-Salicylamide-Caffeine (BC FAST PAIN RELIEF) 650-195-33.3 MG PACK Take 1 packet by mouth every 4 (four) hours as needed (for pain).    [provider]  benazepril (LOTENSIN) 40 MG tablet Take 40 mg by mouth daily.     [provider]  dextran 70-hypromellose (TEARS RENEWED) ophthalmic solution Place 1 drop into both eyes 3 (three) times daily as needed for dry eyes. For dry  eyes    [provider]  hydrochlorothiazide (HYDRODIURIL) 25 MG tablet Take 25 mg by mouth daily.    [provider]  JANUVIA 100 MG tablet Take 100 mg by mouth daily. 07/01/16   [provider]  LEVEMIR FLEXTOUCH 100 UNIT/ML Pen Inject 25 Units into the skin at bedtime. None if BS is under 200 07/07/16   [provider]  metFORMIN (GLUCOPHAGE) 1000 MG tablet Take 1,000 mg by mouth 2 (two) times daily. 07/09/16   [provider]  metoprolol (LOPRESSOR) 25 MG tablet Take 0.5 tablets (12.5 mg total) by mouth daily. 08/08/16   Rhetta MuraSamtani, Jai-Gurmukh, MD   Multiple Vitamin (MULITIVITAMIN WITH MINERALS) TABS Take 1 tablet by mouth daily.    [provider]  pantoprazole (PROTONIX) 40 MG tablet Take 1 tablet (40 mg total) by mouth 2 (two) times daily. 08/08/16   Rhetta MuraSamtani, Jai-Gurmukh, MD  polyethylene glycol (MIRALAX) packet Take 17 g by mouth daily. 01/20/17   Mabe, Latanya MaudlinMartha L, MD  simvastatin (ZOCOR) 40 MG tablet Take 40 mg by mouth every evening.    [provider]  hyoscyamine (LEVBID) 0.375 MG 12 hr tablet Take 1 tablet (0.375 mg total) by mouth every 12 (twelve) hours. 07/30/11 08/19/11  Cristal Fordeddy, Srikar A, MD    Family History Family History  Problem Relation Age of Onset  . Diabetes Mother   . Diabetes Father   . Diabetes Brother     Social History Social History   Tobacco Use  . Smoking status: Former Smoker    Quit date: 07/27/1973    Years since quitting: 45.7  . Smokeless tobacco: Never Used  Substance Use Topics  . Alcohol use: No    Comment: Quit around 2001  . Drug use: Not on file     Allergies   Diazepam   Review of Systems Review of Systems  Level 5 caveat this patient is unresponsive. Physical Exam Updated Vital Signs There were no vitals taken for this visit.  Physical Exam Vitals signs and nursing note reviewed.  Constitutional:      General: He is in acute distress.     Appearance: He is well-developed. He is toxic-appearing.     Comments: Unresponsive.  GCS 3 T.  Pupils symmetric, dilated and nonreactive  HENT:     Head: Normocephalic and atraumatic.  Eyes:     General:        Right eye: No discharge.        Left eye: No discharge.  Neck:     Musculoskeletal: Neck supple.  Cardiovascular:     Comments: Lucas device in place performing compressions.  Pulseless on rhythm checks. Pulmonary:     Comments: ETT tube in place.  Some bloody secretions in tube.  Bagged fairly easily.  Bilateral breath sounds. Abdominal:     General: There is no distension.     Palpations: Abdomen is soft.      Tenderness: There is no abdominal tenderness.     Comments: Soft.  Mild distention.  Skin:    General: Skin is warm and dry.  Neurological:     Comments: GCS 3 T      ED Treatments / Results  Labs (all labs ordered are listed, but only abnormal results are displayed) Labs Reviewed - No data to display  EKG None  Radiology No results found.  Procedures Procedures (including critical care time)  Cardiopulmonary Resuscitation (CPR) Procedure Note Directed/Performed by: Raeford RazorStephen Stephene Alegria I personally directed ancillary staff and/or  performed CPR in an effort to regain return of spontaneous circulation and to maintain cardiac, neuro and systemic perfusion.     EMERGENCY DEPARTMENT [place] CARDIAC EXAM "Study: Limited Ultrasound of the Heart and Pericardium"  INDICATIONS:Cardiac arrest Multiple views of the heart and pericardium were obtained in real-time with a multi-frequency probe.  PERFORMED PP:JKDTOI IMAGES ARCHIVED?: No LIMITATIONS:  Emergent procedure VIEWS USED: Parasternal long axis and Parasternal short axis INTERPRETATION: Agonal contractions noted   Medications Ordered in ED Medications - No data to display   Initial Impression / Assessment and Plan / ED Course  I have reviewed the triage vital signs and the nursing notes.  Pertinent labs & imaging results that were available during my care of the patient were reviewed by me and considered in my medical decision making (see chart for details).     78 year old male with unwitnessed cardiac arrest.  Asystole on EMS arrival.  Prolonged CPR efforts.  Arrived to the emergency room in PEA.  Given the duration of resuscitation without any meaningful improvement I felt that further resuscitation efforts were futile.  Time of death 7:39 AM.  8:46 AM Family, Sherlean Foot contacted. He is a cousin. Apparently he doesn't have additional family aside from some step daughters that Mr Ronnald Ramp hasn't heard from.   Final  Clinical Impressions(s) / ED Diagnoses   Final diagnoses:  Cardiopulmonary arrest [facility])    ED Discharge Orders    None       Virgel Manifold, MD 05/08/2019 [phone]

## 2019-05-07 NOTE — Code Documentation (Addendum)
Ultrasound of heart being done now by Dr Wilson Singer. PEA on monitor no pulse felt.  MD confirms Time of death:  [phone]

## 2019-05-07 DEATH — deceased
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
